# Patient Record
Sex: Male | Born: 1965 | Race: Black or African American | Hispanic: No | Smoking: Current every day smoker
Health system: Southern US, Community
[De-identification: ages and names within clinical notes are randomized; demographics above are authoritative.]

## PROBLEM LIST (undated history)

## (undated) ENCOUNTER — Ambulatory Visit (HOSPITAL_COMMUNITY): Admission: EM | Payer: Commercial Managed Care - HMO | Source: Home / Self Care

## (undated) DIAGNOSIS — I739 Peripheral vascular disease, unspecified: Secondary | ICD-10-CM

## (undated) DIAGNOSIS — I10 Essential (primary) hypertension: Secondary | ICD-10-CM

---

## 1997-08-14 ENCOUNTER — Emergency Department (HOSPITAL_COMMUNITY): Admission: EM | Admit: 1997-08-14 | Discharge: 1997-08-14 | Payer: Self-pay | Admitting: Emergency Medicine

## 1998-03-08 ENCOUNTER — Emergency Department (HOSPITAL_COMMUNITY): Admission: EM | Admit: 1998-03-08 | Discharge: 1998-03-08 | Payer: Self-pay | Admitting: Emergency Medicine

## 1999-03-29 ENCOUNTER — Emergency Department (HOSPITAL_COMMUNITY): Admission: EM | Admit: 1999-03-29 | Discharge: 1999-03-29 | Payer: Self-pay | Admitting: Emergency Medicine

## 2001-04-25 ENCOUNTER — Emergency Department (HOSPITAL_COMMUNITY): Admission: EM | Admit: 2001-04-25 | Discharge: 2001-04-25 | Payer: Self-pay | Admitting: Emergency Medicine

## 2001-11-28 ENCOUNTER — Emergency Department (HOSPITAL_COMMUNITY): Admission: EM | Admit: 2001-11-28 | Discharge: 2001-11-28 | Payer: Self-pay | Admitting: Emergency Medicine

## 2006-06-22 ENCOUNTER — Emergency Department (HOSPITAL_COMMUNITY): Admission: EM | Admit: 2006-06-22 | Discharge: 2006-06-22 | Payer: Self-pay | Admitting: Emergency Medicine

## 2006-08-21 ENCOUNTER — Emergency Department (HOSPITAL_COMMUNITY): Admission: EM | Admit: 2006-08-21 | Discharge: 2006-08-21 | Payer: Self-pay | Admitting: Emergency Medicine

## 2007-01-06 ENCOUNTER — Emergency Department (HOSPITAL_COMMUNITY): Admission: EM | Admit: 2007-01-06 | Discharge: 2007-01-06 | Payer: Self-pay | Admitting: Emergency Medicine

## 2007-03-04 ENCOUNTER — Emergency Department (HOSPITAL_COMMUNITY): Admission: EM | Admit: 2007-03-04 | Discharge: 2007-03-04 | Payer: Self-pay | Admitting: Emergency Medicine

## 2008-04-03 ENCOUNTER — Emergency Department (HOSPITAL_COMMUNITY): Admission: EM | Admit: 2008-04-03 | Discharge: 2008-04-03 | Payer: Self-pay | Admitting: Family Medicine

## 2008-06-12 ENCOUNTER — Observation Stay (HOSPITAL_COMMUNITY): Admission: EM | Admit: 2008-06-12 | Discharge: 2008-06-13 | Payer: Self-pay | Admitting: Internal Medicine

## 2009-01-03 ENCOUNTER — Emergency Department (HOSPITAL_COMMUNITY): Admission: EM | Admit: 2009-01-03 | Discharge: 2009-01-03 | Payer: Self-pay | Admitting: Emergency Medicine

## 2009-11-09 ENCOUNTER — Inpatient Hospital Stay (HOSPITAL_COMMUNITY): Admission: EM | Admit: 2009-11-09 | Discharge: 2009-11-11 | Payer: Self-pay | Admitting: Emergency Medicine

## 2010-03-24 ENCOUNTER — Emergency Department (HOSPITAL_COMMUNITY)
Admission: EM | Admit: 2010-03-24 | Discharge: 2010-03-24 | Payer: Self-pay | Source: Home / Self Care | Admitting: Emergency Medicine

## 2010-05-16 LAB — BASIC METABOLIC PANEL
Calcium: 8.9 mg/dL (ref 8.4–10.5)
Chloride: 105 mEq/L (ref 96–112)
Creatinine, Ser: 0.99 mg/dL (ref 0.4–1.5)
Creatinine, Ser: 1.29 mg/dL (ref 0.4–1.5)
GFR calc Af Amer: >60 mL/min (ref >60)
GFR calc Af Amer: >60 mL/min (ref >60)
Glucose, Bld: 350 mg/dL — ABNORMAL HIGH (ref 70–99)
Sodium: 135 mEq/L (ref 135–145)

## 2010-05-16 LAB — GLUCOSE, CAPILLARY
Glucose-Capillary: 101 mg/dL — ABNORMAL HIGH (ref 70–99)
Glucose-Capillary: 133 mg/dL — ABNORMAL HIGH (ref 70–99)
Glucose-Capillary: 137 mg/dL — ABNORMAL HIGH (ref 70–99)
Glucose-Capillary: 161 mg/dL — ABNORMAL HIGH (ref 70–99)
Glucose-Capillary: 163 mg/dL — ABNORMAL HIGH (ref 70–99)
Glucose-Capillary: 171 mg/dL — ABNORMAL HIGH (ref 70–99)
Glucose-Capillary: 181 mg/dL — ABNORMAL HIGH (ref 70–99)
Glucose-Capillary: 184 mg/dL — ABNORMAL HIGH (ref 70–99)
Glucose-Capillary: 198 mg/dL — ABNORMAL HIGH (ref 70–99)
Glucose-Capillary: 198 mg/dL — ABNORMAL HIGH (ref 70–99)
Glucose-Capillary: 212 mg/dL — ABNORMAL HIGH (ref 70–99)
Glucose-Capillary: 220 mg/dL — ABNORMAL HIGH (ref 70–99)
Glucose-Capillary: 229 mg/dL — ABNORMAL HIGH (ref 70–99)
Glucose-Capillary: 284 mg/dL — ABNORMAL HIGH (ref 70–99)
Glucose-Capillary: 299 mg/dL — ABNORMAL HIGH (ref 70–99)
Glucose-Capillary: 345 mg/dL — ABNORMAL HIGH (ref 70–99)
Glucose-Capillary: 354 mg/dL — ABNORMAL HIGH (ref 70–99)
Glucose-Capillary: 357 mg/dL — ABNORMAL HIGH (ref 70–99)
Glucose-Capillary: 375 mg/dL — ABNORMAL HIGH (ref 70–99)

## 2010-05-16 LAB — CBC
HCT: 41.4 % (ref 39.0–52.0)
HCT: 43.7 % (ref 39.0–52.0)
Hemoglobin: 14.1 g/dL (ref 13.0–17.0)
Hemoglobin: 15 g/dL (ref 13.0–17.0)
Hemoglobin: 15.1 g/dL (ref 13.0–17.0)
MCH: 28.1 pg (ref 26.0–34.0)
MCH: 28.4 pg (ref 26.0–34.0)
MCHC: 34 g/dL (ref 30.0–36.0)
MCHC: 34.3 g/dL (ref 30.0–36.0)
MCHC: 34.5 g/dL (ref 30.0–36.0)
MCV: 82.8 fL (ref 78.0–100.0)
Platelets: 185 10*3/uL (ref 150–400)
Platelets: 208 10*3/uL (ref 150–400)
RBC: 4.8 MIL/uL (ref 4.22–5.81)
RBC: 5.29 MIL/uL (ref 4.22–5.81)
RBC: 5.39 MIL/uL (ref 4.22–5.81)
RDW: 14.6 % (ref 11.5–15.5)
WBC: 12.1 10*3/uL — ABNORMAL HIGH (ref 4.0–10.5)
WBC: 9.5 10*3/uL (ref 4.0–10.5)

## 2010-05-16 LAB — COMPREHENSIVE METABOLIC PANEL
ALT: 23 U/L (ref 0–53)
ALT: 32 U/L (ref 0–53)
Alkaline Phosphatase: 45 U/L (ref 39–117)
BUN: 34 mg/dL — ABNORMAL HIGH (ref 6–23)
CO2: 25 mEq/L (ref 19–32)
CO2: 28 mEq/L (ref 19–32)
Calcium: 8.9 mg/dL (ref 8.4–10.5)
GFR calc Af Amer: >60 mL/min (ref >60)
GFR calc non Af Amer: 38 mL/min — ABNORMAL LOW (ref >60)
GFR calc non Af Amer: >60 mL/min (ref >60)
Glucose, Bld: 128 mg/dL — ABNORMAL HIGH (ref 70–99)
Potassium: 4 mEq/L (ref 3.5–5.1)
Sodium: 139 mEq/L (ref 135–145)

## 2010-05-16 LAB — DIFFERENTIAL
Basophils Relative: 0 % (ref 0–1)
Eosinophils Absolute: 0 10*3/uL (ref 0.0–0.7)
Neutro Abs: 6.7 10*3/uL (ref 1.7–7.7)

## 2010-05-16 LAB — MAGNESIUM: Magnesium: 2.1 mg/dL (ref 1.5–2.5)

## 2010-05-16 LAB — LIPID PANEL
HDL: 56 mg/dL (ref >39)
Triglycerides: 186 mg/dL — ABNORMAL HIGH (ref <150)
VLDL: 37 mg/dL (ref 0–40)

## 2010-05-16 LAB — PROTIME-INR
INR: 1.06 (ref 0.00–1.49)
Prothrombin Time: 14 seconds (ref 11.6–15.2)

## 2010-05-16 LAB — CARDIAC PANEL(CRET KIN+CKTOT+MB+TROPI)
CK, MB: 1.7 ng/mL (ref 0.3–4.0)
Total CK: 296 U/L — ABNORMAL HIGH (ref 7–232)
Troponin I: 0.03 ng/mL (ref 0.00–0.06)

## 2010-05-16 LAB — RAPID URINE DRUG SCREEN, HOSP PERFORMED: Barbiturates: NOT DETECTED

## 2010-05-16 LAB — APTT: aPTT: 25 seconds (ref 24–37)

## 2010-05-16 LAB — HEMOGLOBIN A1C: Hgb A1c MFr Bld: 11.6 % — ABNORMAL HIGH (ref <5.7)

## 2010-06-12 LAB — URINE MICROSCOPIC-ADD ON

## 2010-06-12 LAB — GLUCOSE, CAPILLARY
Glucose-Capillary: 224 mg/dL — ABNORMAL HIGH (ref 70–99)
Glucose-Capillary: 361 mg/dL — ABNORMAL HIGH (ref 70–99)
Glucose-Capillary: 372 mg/dL — ABNORMAL HIGH (ref 70–99)
Glucose-Capillary: 381 mg/dL — ABNORMAL HIGH (ref 70–99)
Glucose-Capillary: 423 mg/dL — ABNORMAL HIGH (ref 70–99)
Glucose-Capillary: 80 mg/dL (ref 70–99)

## 2010-06-12 LAB — DIFFERENTIAL
Lymphocytes Relative: 32 % (ref 12–46)
Lymphs Abs: 1.8 10*3/uL (ref 0.7–4.0)
Neutro Abs: 3.4 10*3/uL (ref 1.7–7.7)
Neutrophils Relative %: 59 % (ref 43–77)

## 2010-06-12 LAB — BASIC METABOLIC PANEL
BUN: 16 mg/dL (ref 6–23)
Calcium: 9.7 mg/dL (ref 8.4–10.5)
Creatinine, Ser: 1.15 mg/dL (ref 0.4–1.5)
GFR calc non Af Amer: >60 mL/min (ref >60)
Potassium: 3.9 mEq/L (ref 3.5–5.1)

## 2010-06-12 LAB — CBC
HCT: 46.7 % (ref 39.0–52.0)
Platelets: 199 10*3/uL (ref 150–400)
WBC: 5.8 10*3/uL (ref 4.0–10.5)

## 2010-06-12 LAB — POCT I-STAT, CHEM 8
Calcium, Ion: 1.18 mmol/L (ref 1.12–1.32)
Chloride: 100 mEq/L (ref 96–112)
HCT: 50 % (ref 39.0–52.0)
Potassium: 4.2 mEq/L (ref 3.5–5.1)

## 2010-06-12 LAB — URINALYSIS, ROUTINE W REFLEX MICROSCOPIC
Glucose, UA: >1000 mg/dL — AB
Hgb urine dipstick: NEGATIVE
Specific Gravity, Urine: 1.038 — ABNORMAL HIGH (ref 1.005–1.030)
Urobilinogen, UA: 0.2 mg/dL (ref 0.0–1.0)

## 2010-06-18 LAB — POCT RAPID STREP A (OFFICE): Streptococcus, Group A Screen (Direct): NEGATIVE

## 2010-12-10 LAB — I-STAT 8, (EC8 V) (CONVERTED LAB)
BUN: 14
Bicarbonate: 27.4 — ABNORMAL HIGH
Chloride: 107
Glucose, Bld: 103 — ABNORMAL HIGH
Hemoglobin: 17
pCO2, Ven: 50.6 — ABNORMAL HIGH

## 2011-11-09 ENCOUNTER — Encounter (HOSPITAL_COMMUNITY): Payer: Self-pay

## 2011-11-09 ENCOUNTER — Emergency Department (HOSPITAL_COMMUNITY)
Admission: EM | Admit: 2011-11-09 | Discharge: 2011-11-09 | Disposition: A | Discharge status: Home / Self Care | Payer: BC Managed Care – PPO | Attending: Emergency Medicine | Admitting: Emergency Medicine

## 2011-11-09 DIAGNOSIS — I10 Essential (primary) hypertension: Secondary | ICD-10-CM | POA: Insufficient documentation

## 2011-11-09 DIAGNOSIS — E119 Type 2 diabetes mellitus without complications: Secondary | ICD-10-CM | POA: Insufficient documentation

## 2011-11-09 DIAGNOSIS — K047 Periapical abscess without sinus: Secondary | ICD-10-CM | POA: Insufficient documentation

## 2011-11-09 DIAGNOSIS — F17200 Nicotine dependence, unspecified, uncomplicated: Secondary | ICD-10-CM | POA: Insufficient documentation

## 2011-11-09 HISTORY — DX: Essential (primary) hypertension: I10

## 2011-11-09 MED ORDER — OXYCODONE-ACETAMINOPHEN 5-325 MG PO TABS: 2.0000 | ORAL_TABLET | ORAL | Status: AC | PRN

## 2011-11-09 MED ORDER — HYDROMORPHONE HCL PF 1 MG/ML IJ SOLN: 1.0000 mg | Freq: Once | INTRAMUSCULAR | Status: DC

## 2011-11-09 MED ORDER — OXYCODONE-ACETAMINOPHEN 5-325 MG PO TABS
2.0000 | ORAL_TABLET | Freq: Once | ORAL | Status: AC
  Administered 2011-11-09: 2 via ORAL
  Filled 2011-11-09: qty 2

## 2011-11-09 MED ORDER — OXYCODONE-ACETAMINOPHEN 5-325 MG PO TABS: 2.0000 | ORAL_TABLET | Freq: Once | ORAL | Status: DC

## 2011-11-09 NOTE — ED Notes (Signed)
Pt reports dental pain to upper left mouth.  Pain for 1 week- "busted open 5 days ago only bloody drained out" denies N/V/D and fever

## 2011-11-09 NOTE — ED Provider Notes (Signed)
History     CSN: [id number]  Arrival date & time 11/09/11  1553   First MD Initiated Contact with Patient 11/09/11 1831      Chief Complaint  Patient presents with  . Dental Pain  . Facial Swelling    x 6 days    (Consider location/radiation/quality/duration/timing/severity/associated sxs/prior treatment) HPI Comments: Jose Morrow 46 y.o. male   The chief complaint is: Patient presents with:   Dental Pain   Facial Swelling - x 6 days   The patient has medical history significant for:   Past Medical History:   Diabetes mellitus                                            Hypertension                                                Patient presents with dental pain present for 1 week. Patient states he saw his primary care who prescribed clindamycin but that the antibiotics do not seem to be working. Patient states that 5 days open the area leaked bloody discharge. He states he assumed he was not supposed to see the dentist until he finished the antibiotics. Denies fever or chills. Denies dysphagia or tongue swelling. Denies NVD or abdominal pain.     The history is provided by the patient.    Past Medical History  Diagnosis Date  . Diabetes mellitus   . Hypertension     History reviewed. No pertinent past surgical history.  No family history on file.  History  Substance Use Topics  . Smoking status: Current Everyday Smoker  . Smokeless tobacco: Not on file  . Alcohol Use: Yes      Review of Systems  Constitutional: Negative for fever and chills.  HENT: Positive for dental problem. Negative for trouble swallowing.   Gastrointestinal: Negative for nausea, vomiting, abdominal pain and diarrhea.  All other systems reviewed and are negative.    Allergies  Penicillins  Home Medications   Current Outpatient Rx  Name Route Sig Dispense Refill  . CLINDAMYCIN HCL 300 MG PO CAPS Oral Take 300 mg by mouth 4 (four) times daily.    . IBUPROFEN 200 MG PO TABS  Oral Take 200-600 mg by mouth every 6 (six) hours as needed. For pain      BP 165/94  Pulse 77  Temp 98.2 F (36.8 C) (Oral)  Resp 18  Wt 212 lb (96.163 kg)  SpO2 100%  Physical Exam  Nursing note and vitals reviewed. Constitutional: He appears well-developed and well-nourished.  HENT:  Head: Normocephalic and atraumatic.  Mouth/Throat: Oropharynx is clear and moist. No oropharyngeal exudate.       Dentition in poor repair two teeth on the left side with caries and and broken off. There is a large area of swelling, tenderness, and pain, on the left portion of the upper palate consistent with abscess.   Eyes: Conjunctivae and EOM are normal. Pupils are equal, round, and reactive to light.  Neck: Normal range of motion. Neck supple.  Cardiovascular: Normal rate, regular rhythm and normal heart sounds.   Pulmonary/Chest: Effort normal and breath sounds normal.  Abdominal: Soft. Bowel sounds are normal. There is no tenderness.  Skin: Skin is warm and dry.    ED Course  Procedures (including critical care time)  Labs Reviewed - No data to display No results found.   1. Dental abscess       MDM  Patient presented with a dental abscess of the upper palate. Area sterilized, lidocaine applied, and 5cc's of pus drained per recommendation of attending physician. Patient discharged on pain medication and instructed to continue the clindamycin written by his PCP. Patient referred to dentist on call, with instruction to make appointment tomorrow. No red flags for Ludwig's angina. Return precautions given verbally and in discharge summary.        Pixie Casino, PA-C 11/09/11 2127

## 2011-11-09 NOTE — ED Provider Notes (Signed)
Medical screening examination/treatment/procedure(s) were performed by non-physician practitioner and as supervising physician I was immediately available for consultation/collaboration.   Glynn Octave, MD 11/09/11 [phone]

## 2012-01-22 ENCOUNTER — Other Ambulatory Visit: Payer: Self-pay | Admitting: Family

## 2012-01-22 ENCOUNTER — Ambulatory Visit
Admission: RE | Admit: 2012-01-22 | Discharge: 2012-01-22 | Disposition: A | Discharge status: Home / Self Care | Payer: Self-pay | Source: Ambulatory Visit | Attending: Internal Medicine | Admitting: Internal Medicine

## 2012-01-22 DIAGNOSIS — R05 Cough: Secondary | ICD-10-CM

## 2012-03-10 ENCOUNTER — Encounter (HOSPITAL_COMMUNITY): Payer: Self-pay | Admitting: Emergency Medicine

## 2012-03-10 ENCOUNTER — Emergency Department (HOSPITAL_COMMUNITY)
Admission: EM | Admit: 2012-03-10 | Discharge: 2012-03-10 | Disposition: A | Discharge status: Home / Self Care | Payer: BC Managed Care – PPO | Attending: Emergency Medicine | Admitting: Emergency Medicine

## 2012-03-10 DIAGNOSIS — E119 Type 2 diabetes mellitus without complications: Secondary | ICD-10-CM | POA: Insufficient documentation

## 2012-03-10 DIAGNOSIS — R6883 Chills (without fever): Secondary | ICD-10-CM | POA: Insufficient documentation

## 2012-03-10 DIAGNOSIS — I10 Essential (primary) hypertension: Secondary | ICD-10-CM | POA: Insufficient documentation

## 2012-03-10 DIAGNOSIS — F17200 Nicotine dependence, unspecified, uncomplicated: Secondary | ICD-10-CM | POA: Insufficient documentation

## 2012-03-10 DIAGNOSIS — K047 Periapical abscess without sinus: Secondary | ICD-10-CM | POA: Insufficient documentation

## 2012-03-10 MED ORDER — CEFTRIAXONE SODIUM 1 G IJ SOLR
1.0000 g | Freq: Once | INTRAMUSCULAR | Status: AC
  Administered 2012-03-10: 1 g via INTRAMUSCULAR
  Filled 2012-03-10: qty 10

## 2012-03-10 MED ORDER — CLINDAMYCIN HCL 300 MG PO CAPS: 300.0000 mg | ORAL_CAPSULE | Freq: Four times a day (QID) | ORAL | Status: DC

## 2012-03-10 MED ORDER — BUPIVACAINE-EPINEPHRINE PF 0.5-1:200000 % IJ SOLN
1.8000 mL | Freq: Once | INTRAMUSCULAR | Status: AC
  Administered 2012-03-10: 9 mg
  Filled 2012-03-10: qty 1.8

## 2012-03-10 MED ORDER — OXYCODONE-ACETAMINOPHEN 5-325 MG PO TABS
2.0000 | ORAL_TABLET | Freq: Once | ORAL | Status: AC
  Administered 2012-03-10: 2 via ORAL
  Filled 2012-03-10: qty 2

## 2012-03-10 NOTE — ED Provider Notes (Signed)
History     CSN: [id number]  Arrival date & time 03/10/12  0215   First MD Initiated Contact with Patient 03/10/12 [phone]      Chief Complaint  Patient presents with  . Dental Pain   HPI  History provided by the patient. Patient is a 47 year old male with history of hypertension and diabetes who presents with complaints of dental pain and abscess. Patient reports having increasing swelling and pain to his left upper mouth for the past week. Patient has had similar issues off and on for quite some time. He reports seeing a dentist not too long ago who did perform x-rays and provided prescription for amoxicillin. Patient states after taking amoxicillin for 7 days swelling went down and symptoms improved but they have returned again. Patient states his dentist would not do further treatments because they do not accept his [employer] but he was told he require surgery to remove his teeth. Symptoms today have been associated with some chills but no fevers. No difficulty swallowing or breathing. No difficulty opening or closing her mouth. Pain has been improved with ibuprofen slightly. Denies any other aggravating or alleviating factors.    Past Medical History  Diagnosis Date  . Diabetes mellitus   . Hypertension     History reviewed. No pertinent past surgical history.  No family history on file.  History  Substance Use Topics  . Smoking status: Current Every Day Smoker  . Smokeless tobacco: Not on file  . Alcohol Use: Yes      Review of Systems  Constitutional: Positive for chills. Negative for fever.  HENT: Positive for dental problem.   Gastrointestinal: Negative for nausea, vomiting and diarrhea.  All other systems reviewed and are negative.    Allergies  Penicillins and Ibuprofen  Home Medications   Current Outpatient Rx  Name  Route  Sig  Dispense  Refill  . IBUPROFEN 800 MG PO TABS   Oral   Take 800 mg by mouth every 8 (eight) hours as  needed. For pain           BP 196/109  Pulse 75  Temp 98 F (36.7 C) (Oral)  Resp 16  SpO2 99%  Physical Exam  Nursing note and vitals reviewed. Constitutional: He appears well-developed and well-nourished. No distress.  HENT:  Head: Normocephalic.  Mouth/Throat:         Poor dentition throughout. Several teeth in the left route mouth with significant decay to the gumline. There is significant swelling adjacent to the first premolar tooth with fluctuance nodule extending to the roof of the mouth  Cardiovascular: Normal rate and regular rhythm.   Pulmonary/Chest: Effort normal and breath sounds normal.  Neurological: He is alert.  Skin: Skin is warm.  Psychiatric: He has a normal mood and affect. His behavior is normal.    ED Course  Procedures   INCISION AND DRAINAGE Performed by: Angus Seller Consent: Verbal consent obtained. Risks and benefits: risks, benefits and alternatives were discussed Type: Dental abscess  Location: Medial left upper first premolar  Anesthesia: local infiltration  Incision was made with a scalpel.  Local anesthetic: Bupivacaine 0.5% with epinephrine  Anesthetic total: 1.8 ml  Complexity: complex  Drainage: purulent  Drainage amount: Large   Packing material: None   Patient tolerance: Patient tolerated the procedure well with no immediate complications.     1. Dental abscess       MDM  3:20AM patient seen and evaluated. Findings consistent  with dental abscess with fluctuance.        Angus Seller, [place] 03/10/12 [phone]

## 2012-03-10 NOTE — ED Notes (Signed)
Pt alert, arrives from home, c/o dental pain, onset several weeks ago, resp even unlabored, skin pwd, multiple dental caries noted

## 2012-03-10 NOTE — ED Provider Notes (Signed)
Medical screening examination/treatment/procedure(s) were performed by non-physician practitioner and as supervising physician I was immediately available for consultation/collaboration.  Timara Loma, MD 03/10/12 0728 

## 2012-09-20 ENCOUNTER — Emergency Department (HOSPITAL_COMMUNITY): Payer: BC Managed Care – PPO

## 2012-09-20 ENCOUNTER — Emergency Department (HOSPITAL_COMMUNITY)
Admission: EM | Admit: 2012-09-20 | Discharge: 2012-09-20 | Disposition: A | Discharge status: Home / Self Care | Payer: BC Managed Care – PPO | Attending: Emergency Medicine | Admitting: Emergency Medicine

## 2012-09-20 ENCOUNTER — Encounter (HOSPITAL_COMMUNITY): Payer: Self-pay | Admitting: *Deleted

## 2012-09-20 DIAGNOSIS — I10 Essential (primary) hypertension: Secondary | ICD-10-CM | POA: Insufficient documentation

## 2012-09-20 DIAGNOSIS — Y9389 Activity, other specified: Secondary | ICD-10-CM | POA: Insufficient documentation

## 2012-09-20 DIAGNOSIS — Z880 Allergy status to penicillin: Secondary | ICD-10-CM | POA: Insufficient documentation

## 2012-09-20 DIAGNOSIS — Y929 Unspecified place or not applicable: Secondary | ICD-10-CM | POA: Insufficient documentation

## 2012-09-20 DIAGNOSIS — F17200 Nicotine dependence, unspecified, uncomplicated: Secondary | ICD-10-CM | POA: Insufficient documentation

## 2012-09-20 DIAGNOSIS — E119 Type 2 diabetes mellitus without complications: Secondary | ICD-10-CM | POA: Insufficient documentation

## 2012-09-20 DIAGNOSIS — IMO0002 Reserved for concepts with insufficient information to code with codable children: Secondary | ICD-10-CM | POA: Insufficient documentation

## 2012-09-20 DIAGNOSIS — L03019 Cellulitis of unspecified finger: Secondary | ICD-10-CM | POA: Insufficient documentation

## 2012-09-20 DIAGNOSIS — L03011 Cellulitis of right finger: Secondary | ICD-10-CM

## 2012-09-20 MED ORDER — HYDROCODONE-ACETAMINOPHEN 5-325 MG PO TABS: 1.0000 | ORAL_TABLET | ORAL | Status: DC | PRN

## 2012-09-20 MED ORDER — SULFAMETHOXAZOLE-TRIMETHOPRIM 800-160 MG PO TABS: 1.0000 | ORAL_TABLET | Freq: Two times a day (BID) | ORAL | Status: DC
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
## 2012-09-20 NOTE — ED Provider Notes (Signed)
Medical screening examination/treatment/procedure(s) were performed by non-physician practitioner and as supervising physician I was immediately available for consultation/collaboration.   Ashby Dawes, MD 09/20/12 [phone]

## 2012-09-20 NOTE — Progress Notes (Signed)
Patient reports his pcp is Malachy Chamber NP
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
## 2012-09-20 NOTE — ED Notes (Signed)
Pt states hasn't taken BP medication x 3 days, states he has to go pick it up today when leaving here.

## 2012-10-27 ENCOUNTER — Emergency Department (HOSPITAL_COMMUNITY)
Admission: EM | Admit: 2012-10-27 | Discharge: 2012-10-27 | Disposition: A | Discharge status: Home / Self Care | Payer: BC Managed Care – PPO | Attending: Emergency Medicine | Admitting: Emergency Medicine

## 2012-10-27 DIAGNOSIS — R0602 Shortness of breath: Secondary | ICD-10-CM | POA: Insufficient documentation

## 2012-10-27 DIAGNOSIS — E119 Type 2 diabetes mellitus without complications: Secondary | ICD-10-CM | POA: Insufficient documentation

## 2012-10-27 DIAGNOSIS — R42 Dizziness and giddiness: Secondary | ICD-10-CM | POA: Insufficient documentation

## 2012-10-27 DIAGNOSIS — R5381 Other malaise: Secondary | ICD-10-CM | POA: Insufficient documentation

## 2012-10-27 DIAGNOSIS — Z880 Allergy status to penicillin: Secondary | ICD-10-CM | POA: Insufficient documentation

## 2012-10-27 DIAGNOSIS — F17200 Nicotine dependence, unspecified, uncomplicated: Secondary | ICD-10-CM | POA: Insufficient documentation

## 2012-10-27 DIAGNOSIS — M542 Cervicalgia: Secondary | ICD-10-CM | POA: Insufficient documentation

## 2012-10-27 DIAGNOSIS — Z79899 Other long term (current) drug therapy: Secondary | ICD-10-CM | POA: Insufficient documentation

## 2012-10-27 DIAGNOSIS — Z794 Long term (current) use of insulin: Secondary | ICD-10-CM | POA: Insufficient documentation

## 2012-10-27 DIAGNOSIS — I10 Essential (primary) hypertension: Secondary | ICD-10-CM | POA: Insufficient documentation

## 2012-10-27 LAB — COMPREHENSIVE METABOLIC PANEL
ALT: 29 U/L (ref 0–53)
AST: 30 U/L (ref 0–37)
Albumin: 4 g/dL (ref 3.5–5.2)
Alkaline Phosphatase: 58 U/L (ref 39–117)
BUN: 15 mg/dL (ref 6–23)
CO2: 23 mEq/L (ref 19–32)
Calcium: 9.3 mg/dL (ref 8.4–10.5)
Chloride: 102 mEq/L (ref 96–112)
Creatinine, Ser: 0.9 mg/dL (ref 0.50–1.35)
GFR calc Af Amer: >90 mL/min (ref >90)
GFR calc non Af Amer: >90 mL/min (ref >90)
Glucose, Bld: 264 mg/dL — ABNORMAL HIGH (ref 70–99)
Potassium: 4 mEq/L (ref 3.5–5.1)
Sodium: 135 mEq/L (ref 135–145)
Total Bilirubin: 0.3 mg/dL (ref 0.3–1.2)
Total Protein: 7.3 g/dL (ref 6.0–8.3)

## 2012-10-27 LAB — CBC
HCT: 46.6 % (ref 39.0–52.0)
Hemoglobin: 16.5 g/dL (ref 13.0–17.0)
MCH: 28 pg (ref 26.0–34.0)
MCHC: 35.4 g/dL (ref 30.0–36.0)
MCV: 79.1 fL (ref 78.0–100.0)
Platelets: 193 10*3/uL (ref 150–400)
RBC: 5.89 MIL/uL — ABNORMAL HIGH (ref 4.22–5.81)
RDW: 13.5 % (ref 11.5–15.5)
WBC: 4.8 10*3/uL (ref 4.0–10.5)

## 2012-10-27 LAB — POCT I-STAT TROPONIN I: Troponin i, poc: 0.02 ng/mL (ref 0.00–0.08)

## 2012-10-27 MED ORDER — SODIUM CHLORIDE 0.9 % IV BOLUS (SEPSIS)
1000.0000 mL | Freq: Once | INTRAVENOUS | Status: AC
  Administered 2012-10-27: 1000 mL via INTRAVENOUS

## 2012-10-27 NOTE — ED Provider Notes (Signed)
CSN: [id number]     Arrival date & time 10/27/12  0807 History   First MD Initiated Contact with Patient 10/27/12 [phone]     Chief Complaint  Patient presents with  . Dizziness   (Consider location/radiation/quality/duration/timing/severity/associated sxs/prior Treatment) HPI 47 year old male with HTN and DM presents to the ED with complaints of dizziness.  He reports that last night he had sudden onset lightheadedness. No exacerbating factors.  Relieved by rest.  He reports that he felt as if he was going to pass out.  No reported vertigo. No associated headache, nausea, vomiting, chest pain.  He does report generalized weakness. No recent illness.    Of note, he reports that he misses several doses of his antihypertensives weekly.  Past Medical History  Diagnosis Date  . Diabetes mellitus   . Hypertension    No past surgical history on file. No family history on file. History  Substance Use Topics  . Smoking status: Current Every Day Smoker  . Smokeless tobacco: Never Used  . Alcohol Use: Yes    Review of Systems  Constitutional: Negative for fever and chills.  HENT: Positive for neck pain.   Respiratory: Positive for shortness of breath. Negative for chest tightness.   Cardiovascular: Negative for chest pain.  Gastrointestinal: Negative for nausea and vomiting.  Genitourinary: Negative.   Skin: Negative.   Neurological: Positive for dizziness, weakness and light-headedness. Negative for headaches.    Allergies  Penicillins  Home Medications   Current Outpatient Rx  Name  Route  Sig  Dispense  Refill  . amLODipine (NORVASC) 10 MG tablet   Oral   Take 10 mg by mouth daily.         . hydrochlorothiazide (HYDRODIURIL) 25 MG tablet   Oral   Take 25 mg by mouth daily.         . insulin glargine (LANTUS) 100 UNIT/ML injection   Subcutaneous   Inject 80 Units into the skin every morning.          . metFORMIN (GLUCOPHAGE) 500 MG tablet   Oral   Take 500 mg  by mouth at bedtime.           BP 167/112  Pulse 68  Temp(Src) 97.9 F (36.6 C) (Oral)  Resp 20  SpO2 94% Physical Exam  Constitutional: He is oriented to person, place, and time. He appears well-developed and well-nourished. No distress.  HENT:  Head: Normocephalic and atraumatic.  Mouth/Throat: Oropharynx is clear and moist. No oropharyngeal exudate.  Eyes: Conjunctivae and EOM are normal. Pupils are equal, round, and reactive to light. No scleral icterus.  Neck: Neck supple.  Cardiovascular: Normal rate and regular rhythm.  Exam reveals no gallop and no friction rub.   No murmur heard. Pulmonary/Chest: Effort normal and breath sounds normal. He has no wheezes. He has no rales.  Abdominal: Soft. He exhibits no distension. There is no tenderness.  Musculoskeletal: He exhibits no edema.  Neurological: He is alert and oriented to person, place, and time. No cranial nerve deficit.  Muscle strength 5/5 in all extremities.  No pronator drift. Negative Romberg.   Skin: Skin is warm and dry.   ED Course  Procedures (including critical care time) EKG:    Date: 10/27/2012  Rate: 65  Rhythm: normal sinus rhythm  QRS Axis: normal  Intervals: normal  ST/T Wave abnormalities: ST elevations anteriorly; No change from prior.   Conduction Disutrbances:none  Old EKG Reviewed: unchanged  Labs Review Labs Reviewed  CBC - Abnormal; Notable for the following:    RBC 5.89 (*)    All other components within normal limits  COMPREHENSIVE METABOLIC PANEL - Abnormal; Notable for the following:    Glucose, Bld 264 (*)    All other components within normal limits  POCT I-STAT TROPONIN I   Imaging Review No results found.  MDM   1. Lightheadedness    47 year old male with HTN and DM-2 (poorly controlled) presents with recent lightheadedness. - Appears pre-syncopal in nature.  Will obtain EKG, Troponin, CBC, and CMP. - Will give IV fluids and await studies.    1610 - EKG unchanged from  prior.  CBC unremarkable and Troponin Negative.   1025 - Labs unremarkable.  Patient feeling well with no dizziness/lightheadedness.  Able to ambulate well.  Patient stable for discharge home with close PCP follow up.   Tommie Sams, DO 10/27/12 1029

## 2012-11-04 NOTE — ED Provider Notes (Signed)
I saw and evaluated the patient, reviewed the resident's note and I agree with the findings and plan.  46yM with pre-syncope. Nonfocal exam. Reassuring w/u. Outpt FU.   Raeford Razor, MD 11/04/12 0010

## 2013-06-29 ENCOUNTER — Encounter (HOSPITAL_COMMUNITY): Payer: Self-pay | Admitting: Emergency Medicine

## 2013-06-29 ENCOUNTER — Emergency Department (HOSPITAL_COMMUNITY)
Admission: EM | Admit: 2013-06-29 | Discharge: 2013-06-29 | Disposition: A | Discharge status: Home / Self Care | Payer: No Typology Code available for payment source | Attending: Emergency Medicine | Admitting: Emergency Medicine

## 2013-06-29 DIAGNOSIS — IMO0002 Reserved for concepts with insufficient information to code with codable children: Secondary | ICD-10-CM | POA: Insufficient documentation

## 2013-06-29 DIAGNOSIS — E119 Type 2 diabetes mellitus without complications: Secondary | ICD-10-CM | POA: Insufficient documentation

## 2013-06-29 DIAGNOSIS — Z79899 Other long term (current) drug therapy: Secondary | ICD-10-CM | POA: Insufficient documentation

## 2013-06-29 DIAGNOSIS — Z880 Allergy status to penicillin: Secondary | ICD-10-CM | POA: Diagnosis not present

## 2013-06-29 DIAGNOSIS — M62830 Muscle spasm of back: Secondary | ICD-10-CM

## 2013-06-29 DIAGNOSIS — I10 Essential (primary) hypertension: Secondary | ICD-10-CM | POA: Diagnosis not present

## 2013-06-29 DIAGNOSIS — K006 Disturbances in tooth eruption: Secondary | ICD-10-CM | POA: Insufficient documentation

## 2013-06-29 DIAGNOSIS — Y92410 Unspecified street and highway as the place of occurrence of the external cause: Secondary | ICD-10-CM | POA: Insufficient documentation

## 2013-06-29 DIAGNOSIS — Z794 Long term (current) use of insulin: Secondary | ICD-10-CM | POA: Insufficient documentation

## 2013-06-29 DIAGNOSIS — Y9389 Activity, other specified: Secondary | ICD-10-CM | POA: Diagnosis not present

## 2013-06-29 DIAGNOSIS — F17200 Nicotine dependence, unspecified, uncomplicated: Secondary | ICD-10-CM | POA: Insufficient documentation
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
## 2014-07-24 ENCOUNTER — Emergency Department (HOSPITAL_COMMUNITY): Payer: BLUE CROSS/BLUE SHIELD

## 2014-07-24 ENCOUNTER — Encounter (HOSPITAL_COMMUNITY): Payer: Self-pay | Admitting: Emergency Medicine

## 2014-07-24 ENCOUNTER — Emergency Department (HOSPITAL_COMMUNITY)
Admission: EM | Admit: 2014-07-24 | Discharge: 2014-07-24 | Disposition: A | Discharge status: Home / Self Care | Payer: BLUE CROSS/BLUE SHIELD | Attending: Emergency Medicine | Admitting: Emergency Medicine

## 2014-07-24 DIAGNOSIS — S61215A Laceration without foreign body of left ring finger without damage to nail, initial encounter: Secondary | ICD-10-CM | POA: Insufficient documentation

## 2014-07-24 DIAGNOSIS — Y998 Other external cause status: Secondary | ICD-10-CM | POA: Diagnosis not present

## 2014-07-24 DIAGNOSIS — Z880 Allergy status to penicillin: Secondary | ICD-10-CM | POA: Diagnosis not present

## 2014-07-24 DIAGNOSIS — Z794 Long term (current) use of insulin: Secondary | ICD-10-CM | POA: Insufficient documentation

## 2014-07-24 DIAGNOSIS — Z79899 Other long term (current) drug therapy: Secondary | ICD-10-CM | POA: Insufficient documentation

## 2014-07-24 DIAGNOSIS — Y9389 Activity, other specified: Secondary | ICD-10-CM | POA: Insufficient documentation

## 2014-07-24 DIAGNOSIS — Z720 Tobacco use: Secondary | ICD-10-CM | POA: Insufficient documentation

## 2014-07-24 DIAGNOSIS — S61219A Laceration without foreign body of unspecified finger without damage to nail, initial encounter: Secondary | ICD-10-CM

## 2014-07-24 DIAGNOSIS — I10 Essential (primary) hypertension: Secondary | ICD-10-CM | POA: Insufficient documentation

## 2014-07-24 DIAGNOSIS — W500XXA Accidental hit or strike by another person, initial encounter: Secondary | ICD-10-CM | POA: Diagnosis not present

## 2014-07-24 DIAGNOSIS — E119 Type 2 diabetes mellitus without complications: Secondary | ICD-10-CM | POA: Insufficient documentation

## 2014-07-24 DIAGNOSIS — Y9289 Other specified places as the place of occurrence of the external cause: Secondary | ICD-10-CM | POA: Diagnosis not present

## 2014-07-24 MED ORDER — CLINDAMYCIN HCL 150 MG PO CAPS: 150.0000 mg | ORAL_CAPSULE | Freq: Four times a day (QID) | ORAL | Status: DC

## 2014-07-24 MED ORDER — LIDOCAINE HCL (PF) 1 % IJ SOLN: 5.0000 mL | Freq: Once | INTRAMUSCULAR | Status: DC

## 2014-07-24 MED ORDER — BACITRACIN ZINC 500 UNIT/GM EX OINT
1.0000 "application " | TOPICAL_OINTMENT | Freq: Two times a day (BID) | CUTANEOUS | Status: DC
  Administered 2014-07-24: 1 via TOPICAL
  Filled 2014-07-24: qty 1.8

## 2014-07-24 MED ORDER — CLINDAMYCIN HCL 300 MG PO CAPS
300.0000 mg | ORAL_CAPSULE | Freq: Once | ORAL | Status: AC
  Administered 2014-07-24: 300 mg via ORAL
  Filled 2014-07-24: qty 1

## 2014-07-24 MED ORDER — SULFAMETHOXAZOLE-TRIMETHOPRIM 800-160 MG PO TABS: 1.0000 | ORAL_TABLET | Freq: Once | ORAL | Status: DC

## 2014-07-24 MED ORDER — IBUPROFEN 800 MG PO TABS
800.0000 mg | ORAL_TABLET | Freq: Once | ORAL | Status: AC
  Administered 2014-07-24: 800 mg via ORAL
  Filled 2014-07-24: qty 1

## 2014-07-24 MED ORDER — LIDOCAINE HCL 1 % IJ SOLN
INTRAMUSCULAR | Status: AC
  Administered 2014-07-24: 20 mL
  Filled 2014-07-24: qty 20

## 2014-07-24 NOTE — ED Notes (Signed)
Md ar bedside to suture hand lac. Pt tolerating well.

## 2014-07-24 NOTE — ED Notes (Signed)
Patient transported to X-ray 
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
## 2014-07-24 NOTE — ED Notes (Signed)
Awake. Verbally responsive. A/O x4. Resp even and unlabored. No audible adventitious breath sounds noted. ABC's intact.  

## 2014-07-24 NOTE — Discharge Instructions (Signed)
If you were given medicines take as directed.  If you are on coumadin or contraceptives realize their levels and effectiveness is altered by many different medicines.  If you have any reaction (rash, tongues swelling, other) to the medicines stop taking and see a physician.   Keep wound clean, watch for signs of infection such as pus training, spreading redness, worsening pain and swelling.  If your blood pressure was elevated in the ER make sure you follow up for management with a primary doctor or return for chest pain, shortness of breath or stroke symptoms.  Please follow up as directed and return to the ER or see a physician for new or worsening symptoms.  Thank you. Filed Vitals:   07/24/14 0716  BP: 163/95  Pulse: 80  Resp: 16  SpO2: 100%
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
## 2014-07-24 NOTE — ED Notes (Signed)
Applied bacitracin oint nonadhesive dry dsg to 4th lt finger lac. Pt tolerated well.

## 2015-06-22 ENCOUNTER — Emergency Department (HOSPITAL_COMMUNITY)
Admission: EM | Admit: 2015-06-22 | Discharge: 2015-06-23 | Disposition: A | Discharge status: Home / Self Care | Payer: BLUE CROSS/BLUE SHIELD | Attending: Emergency Medicine | Admitting: Emergency Medicine

## 2015-06-22 ENCOUNTER — Encounter (HOSPITAL_COMMUNITY): Payer: Self-pay | Admitting: *Deleted

## 2015-06-22 DIAGNOSIS — E119 Type 2 diabetes mellitus without complications: Secondary | ICD-10-CM | POA: Insufficient documentation

## 2015-06-22 DIAGNOSIS — I159 Secondary hypertension, unspecified: Secondary | ICD-10-CM

## 2015-06-22 DIAGNOSIS — F17200 Nicotine dependence, unspecified, uncomplicated: Secondary | ICD-10-CM | POA: Insufficient documentation

## 2015-06-22 DIAGNOSIS — R51 Headache: Secondary | ICD-10-CM | POA: Diagnosis present

## 2015-06-22 DIAGNOSIS — Z880 Allergy status to penicillin: Secondary | ICD-10-CM | POA: Insufficient documentation

## 2015-06-22 LAB — URINALYSIS, ROUTINE W REFLEX MICROSCOPIC
Bilirubin Urine: NEGATIVE
Hgb urine dipstick: NEGATIVE
Ketones, ur: NEGATIVE mg/dL
LEUKOCYTES UA: NEGATIVE
NITRITE: NEGATIVE
PROTEIN: NEGATIVE mg/dL
Specific Gravity, Urine: 1.039 — ABNORMAL HIGH (ref 1.005–1.030)
pH: 7 (ref 5.0–8.0)

## 2015-06-22 LAB — CBG MONITORING, ED: GLUCOSE-CAPILLARY: 330 mg/dL — AB (ref 65–99)

## 2015-06-22 LAB — URINE MICROSCOPIC-ADD ON

## 2015-06-22 LAB — COMPREHENSIVE METABOLIC PANEL
ALT: 14 U/L — AB (ref 17–63)
AST: 20 U/L (ref 15–41)
Albumin: 3.8 g/dL (ref 3.5–5.0)
Alkaline Phosphatase: 53 U/L (ref 38–126)
Anion gap: 11 (ref 5–15)
BILIRUBIN TOTAL: 0.7 mg/dL (ref 0.3–1.2)
BUN: 9 mg/dL (ref 6–20)
CHLORIDE: 101 mmol/L (ref 101–111)
CO2: 24 mmol/L (ref 22–32)
CREATININE: 1.04 mg/dL (ref 0.61–1.24)
Calcium: 8.9 mg/dL (ref 8.9–10.3)
Glucose, Bld: 356 mg/dL — ABNORMAL HIGH (ref 65–99)
Potassium: 3.9 mmol/L (ref 3.5–5.1)
Sodium: 136 mmol/L (ref 135–145)
Total Protein: 6.5 g/dL (ref 6.5–8.1)

## 2015-06-22 LAB — CBC
HCT: 43.2 % (ref 39.0–52.0)
Hemoglobin: 15 g/dL (ref 13.0–17.0)
MCH: 27.3 pg (ref 26.0–34.0)
MCHC: 34.7 g/dL (ref 30.0–36.0)
MCV: 78.5 fL (ref 78.0–100.0)
PLATELETS: 186 10*3/uL (ref 150–400)
RBC: 5.5 MIL/uL (ref 4.22–5.81)
RDW: 13.7 % (ref 11.5–15.5)
WBC: 6.3 10*3/uL (ref 4.0–10.5)

## 2015-06-22 LAB — TROPONIN I: Troponin I: <0.03 ng/mL (ref <0.031)

## 2015-06-22 NOTE — ED Notes (Signed)
ekg at the doctors office no labs drawn

## 2015-06-22 NOTE — ED Notes (Signed)
CBG 330. RN notified

## 2015-06-22 NOTE — ED Notes (Signed)
No diabetic medicine either

## 2015-06-22 NOTE — ED Notes (Signed)
The pt went to his doctor for a check-up  And his bp was high he was sent here for treatment.  The pt has not had bp meds for 4 months.  He was given clonidine 0.2 at the office just before he was sent.  The pt was on hctz 25mg   norvasc 10mg  and losartan  50 mg  No pain anywhere

## 2015-06-23 MED ORDER — INSULIN GLARGINE 100 UNIT/ML ~~LOC~~ SOLN
80.0000 [IU] | Freq: Every morning | SUBCUTANEOUS | Status: DC
Start: 2015-06-23 — End: 2020-06-26

## 2015-06-23 MED ORDER — HYDROCHLOROTHIAZIDE 25 MG PO TABS: 25.0000 mg | ORAL_TABLET | Freq: Every day | ORAL | Status: DC

## 2015-06-23 MED ORDER — AMLODIPINE BESYLATE 5 MG PO TABS
10.0000 mg | ORAL_TABLET | Freq: Once | ORAL | Status: AC
  Administered 2015-06-23: 10 mg via ORAL
  Filled 2015-06-23: qty 2

## 2015-06-23 MED ORDER — LOVASTATIN 20 MG PO TABS: 20.0000 mg | ORAL_TABLET | Freq: Every day | ORAL | Status: DC

## 2015-06-23 MED ORDER — HYDROCHLOROTHIAZIDE 25 MG PO TABS
25.0000 mg | ORAL_TABLET | Freq: Every day | ORAL | Status: DC
  Administered 2015-06-23: 25 mg via ORAL
  Filled 2015-06-23: qty 1

## 2015-06-23 MED ORDER — METFORMIN HCL 500 MG PO TABS
500.0000 mg | ORAL_TABLET | Freq: Once | ORAL | Status: AC
  Administered 2015-06-23: 500 mg via ORAL
  Filled 2015-06-23: qty 1

## 2015-06-23 MED ORDER — METFORMIN HCL 500 MG PO TABS: 500.0000 mg | ORAL_TABLET | Freq: Every day | ORAL | Status: DC

## 2015-06-23 MED ORDER — AMLODIPINE BESYLATE 10 MG PO TABS: 10.0000 mg | ORAL_TABLET | Freq: Every day | ORAL | Status: DC

## 2015-06-23 NOTE — Discharge Instructions (Signed)
Hypertension Mr. Jose Morrow, take medication as directed and see a primary care doctor within 3 days for close follow up of your blood pressure.  Come back to the ED immediately if any symptoms worsen.  Thank you. Hypertension is another name for high blood pressure. High blood pressure forces your heart to work harder to pump blood. A blood pressure reading has two numbers, which includes a higher number over a lower number (example: 110/72). HOME CARE   Have your blood pressure rechecked by your doctor.  Only take medicine as told by your doctor. Follow the directions carefully. The medicine does not work as well if you skip doses. Skipping doses also puts you at risk for problems.  Do not smoke.  Monitor your blood pressure at home as told by your doctor. GET HELP IF:  You think you are having a reaction to the medicine you are taking.  You have repeat headaches or feel dizzy.  You have puffiness (swelling) in your ankles.  You have trouble with your vision. GET HELP RIGHT AWAY IF:   You get a very bad headache and are confused.  You feel weak, numb, or faint.  You get chest or belly (abdominal) pain.  You throw up (vomit).  You cannot breathe very well. MAKE SURE YOU:   Understand these instructions.  Will watch your condition.  Will get help right away if you are not doing well or get worse.   This information is not intended to replace advice given to you by your health care provider. Make sure you discuss any questions you have with your health care provider.   Document Released: 08/06/2007 Document Revised: 02/22/2013 Document Reviewed: 12/10/2012 Elsevier Interactive Patient Education [employer]

## 2015-06-23 NOTE — ED Provider Notes (Signed)
CSN: [id number]     Arrival date & time 06/22/15  1847 History  By signing my name below, I, Phillis HaggisGabriella Gaje, attest that this documentation has been prepared under the direction and in the presence of Tomasita CrumbleAdeleke Yanni Quiroa, MD. Electronically Signed: Phillis HaggisGabriella Gaje, ED Scribe. 06/23/2015. 1:03 AM.   Chief Complaint  Patient presents with  . Hypertension   The history is provided by the patient. No language interpreter was used.  HPI Comments: Jose Morrow is a 50 y.o. male with a hx of HTN and DM who presents to the Emergency Department complaining of increased BP onset earlier today. Pt was sent from his cardiologist for his increased BP. He reports that he has not been able to take his medication in over 4 months. Pt takes Lantus and Metformin for his DM; he was previously on HCTZ 25 mg, Norvasc 10 mg and Losartan 50 mg for his HTN. He reports mild headache. He denies chest pain or SOB.   Past Medical History  Diagnosis Date  . Diabetes mellitus   . Hypertension    History reviewed. No pertinent past surgical history. No family history on file. Social History  Substance Use Topics  . Smoking status: Current Every Day Smoker  . Smokeless tobacco: Never Used  . Alcohol Use: No    Review of Systems 10 Systems reviewed and all are negative for acute change except as noted in the HPI.  Allergies  Penicillins  Home Medications   Prior to Admission medications   Medication Sig Start Date End Date Taking? Authorizing Provider  clindamycin (CLEOCIN) 150 MG capsule Take 1 capsule (150 mg total) by mouth every 6 (six) hours. Patient not taking: Reported on 06/23/2015 07/24/14   Blane OharaJoshua Zavitz, MD   BP 187/119 mmHg  Pulse 62  Temp(Src) 98.8 F (37.1 C) (Oral)  Resp 21  Ht 5\' 9"  (1.753 m)  Wt 192 lb (87.091 kg)  BMI 28.34 kg/m2  SpO2 96% Physical Exam  Constitutional: He is oriented to person, place, and time. Vital signs are normal. He appears well-developed and well-nourished.  Non-toxic  appearance. He does not appear ill. No distress.  HENT:  Head: Normocephalic and atraumatic.  Nose: Nose normal.  Mouth/Throat: Oropharynx is clear and moist. No oropharyngeal exudate.  Eyes: Conjunctivae and EOM are normal. Pupils are equal, round, and reactive to light. No scleral icterus.  Neck: Normal range of motion. Neck supple. No tracheal deviation, no edema, no erythema and normal range of motion present. No thyroid mass and no thyromegaly present.  Cardiovascular: Normal rate, regular rhythm, S1 normal, S2 normal, normal heart sounds, intact distal pulses and normal pulses.  Exam reveals no gallop and no friction rub.   No murmur heard. Pulmonary/Chest: Effort normal and breath sounds normal. No respiratory distress. He has no wheezes. He has no rhonchi. He has no rales.  Abdominal: Soft. Normal appearance and bowel sounds are normal. He exhibits no distension, no ascites and no mass. There is no hepatosplenomegaly. There is no tenderness. There is no rebound, no guarding and no CVA tenderness.  Musculoskeletal: Normal range of motion. He exhibits no edema or tenderness.  Lymphadenopathy:    He has no cervical adenopathy.  Neurological: He is alert and oriented to person, place, and time. He has normal strength. No cranial nerve deficit or sensory deficit.  Normal strength and sensation in all extremities.  Normal cerebellar testing.  Skin: Skin is warm, dry and intact. No petechiae and no rash noted. He is  not diaphoretic. No erythema. No pallor.  Psychiatric: He has a normal mood and affect. His behavior is normal. Judgment normal.  Nursing note and vitals reviewed.   ED Course  Procedures (including critical care time) DIAGNOSTIC STUDIES: Oxygen Saturation is 96% on RA, normal by my interpretation.    COORDINATION OF CARE: 1:02 AM-Discussed treatment plan which includes labs, EKG, and prescriptions for medications with pt at bedside and pt agreed to plan.    Labs  Review Labs Reviewed  COMPREHENSIVE METABOLIC PANEL - Abnormal; Notable for the following:    Glucose, Bld 356 (*)    ALT 14 (*)    All other components within normal limits  URINALYSIS, ROUTINE W REFLEX MICROSCOPIC (NOT AT [facility]) - Abnormal; Notable for the following:    Specific Gravity, Urine 1.039 (*)    Glucose, UA >1000 (*)    All other components within normal limits  URINE MICROSCOPIC-ADD ON - Abnormal; Notable for the following:    Squamous Epithelial / LPF 0-5 (*)    Bacteria, UA RARE (*)    All other components within normal limits  CBG MONITORING, ED - Abnormal; Notable for the following:    Glucose-Capillary 330 (*)    All other components within normal limits  CBC  TROPONIN I    Imaging Review No results found. I have personally reviewed and evaluated these images and lab results as part of my medical decision-making.   EKG Interpretation   Date/Time:  Friday June 22 2015 19:59:15 EDT Ventricular Rate:  69 PR Interval:  162 QRS Duration: 114 QT Interval:  402 QTC Calculation: 430 R Axis:   36 Text Interpretation:  Normal sinus rhythm Incomplete right bundle branch  block Septal infarct , age undetermined Abnormal ECG No significant change  since last tracing Confirmed by Erroll Luna [phone]) on 06/23/2015  12:43:52 AM      MDM   Final diagnoses:  None    Patient presents to the ED for hypertension.  He denies any symptoms of hypertensive emergency.  Will place back on home meds and provide Rx.  PCP fu advised.  He appears well and in NAD.  VS remain within his normal limits and he is safe for DC.   I personally performed the services described in this documentation, which was scribed in my presence. The recorded information has been reviewed and is accurate.     Tomasita Crumble, MD 06/23/15 [phone]

## 2016-05-08 DIAGNOSIS — Z9114 Patient's other noncompliance with medication regimen: Secondary | ICD-10-CM | POA: Diagnosis not present

## 2016-05-08 DIAGNOSIS — I10 Essential (primary) hypertension: Secondary | ICD-10-CM | POA: Diagnosis not present

## 2016-05-08 DIAGNOSIS — E782 Mixed hyperlipidemia: Secondary | ICD-10-CM | POA: Diagnosis not present

## 2016-05-08 DIAGNOSIS — E1165 Type 2 diabetes mellitus with hyperglycemia: Secondary | ICD-10-CM | POA: Diagnosis not present

## 2017-01-12 ENCOUNTER — Other Ambulatory Visit: Payer: Self-pay

## 2017-01-12 ENCOUNTER — Emergency Department (HOSPITAL_COMMUNITY)
Admission: EM | Admit: 2017-01-12 | Discharge: 2017-01-12 | Disposition: A | Discharge status: Home / Self Care | Payer: BLUE CROSS/BLUE SHIELD | Attending: Emergency Medicine | Admitting: Emergency Medicine

## 2017-01-12 ENCOUNTER — Encounter (HOSPITAL_COMMUNITY): Payer: Self-pay | Admitting: Student

## 2017-01-12 DIAGNOSIS — E119 Type 2 diabetes mellitus without complications: Secondary | ICD-10-CM | POA: Diagnosis not present

## 2017-01-12 DIAGNOSIS — Z794 Long term (current) use of insulin: Secondary | ICD-10-CM | POA: Insufficient documentation

## 2017-01-12 DIAGNOSIS — M545 Low back pain, unspecified: Secondary | ICD-10-CM

## 2017-01-12 DIAGNOSIS — Z79899 Other long term (current) drug therapy: Secondary | ICD-10-CM | POA: Diagnosis not present

## 2017-01-12 DIAGNOSIS — F17210 Nicotine dependence, cigarettes, uncomplicated: Secondary | ICD-10-CM | POA: Diagnosis not present

## 2017-01-12 DIAGNOSIS — I10 Essential (primary) hypertension: Secondary | ICD-10-CM

## 2017-01-12 MED ORDER — AMLODIPINE BESYLATE 5 MG PO TABS
10.0000 mg | ORAL_TABLET | Freq: Once | ORAL | Status: AC
  Administered 2017-01-12: 10 mg via ORAL
  Filled 2017-01-12: qty 2

## 2017-01-12 MED ORDER — CYCLOBENZAPRINE HCL 10 MG PO TABS: 10.0000 mg | ORAL_TABLET | Freq: Two times a day (BID) | ORAL | 0 refills | Status: DC | PRN

## 2017-01-12 MED ORDER — AMLODIPINE BESYLATE 10 MG PO TABS: 10.0000 mg | ORAL_TABLET | Freq: Every day | ORAL | 3 refills | Status: DC

## 2017-01-12 MED ORDER — AMLODIPINE BESYLATE 10 MG PO TABS: 10.0000 mg | ORAL_TABLET | Freq: Every day | ORAL | 0 refills | Status: DC

## 2017-01-12 NOTE — Discharge Instructions (Signed)
Please read and follow all provided instructions.  Your diagnoses today include:  1. Acute left-sided low back pain without sciatica   2. Hypertension, unspecified type     Tests performed today include: Vital signs - see below for your results today  Medications prescribed:   Take any prescribed medications only as directed.  Home care instructions:  Follow any educational materials contained in this packet Please rest, use ice or heat on your back for the next several days Do not lift, push, pull anything more than 10 pounds for the next week  Follow-up instructions: Please follow-up with your primary care provider in the next 1 week for further evaluation of your symptoms.   Return instructions:  SEEK IMMEDIATE MEDICAL ATTENTION IF YOU HAVE: New numbness, tingling, weakness, or problem with the use of your arms or legs Severe back pain not relieved with medications Loss control of your bowels or bladder Increasing pain in any areas of the body (such as chest or abdominal pain) Shortness of breath, dizziness, or fainting.  Worsening nausea (feeling sick to your stomach), vomiting, fever, or sweats Any other emergent concerns regarding your health   Additional Information:  Your vital signs today were: BP (!) 211/114    Pulse 76    Temp 97.7 F (36.5 C) (Oral)    Resp 18    SpO2 98%  If your blood pressure (BP) was elevated above 135/85 this visit, please have this repeated by your doctor within one month. --------------
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
## 2017-01-12 NOTE — ED Provider Notes (Signed)
[facility]   CSN: [id number] Arrival date & time: 01/12/17  0913     History   Chief Complaint Chief Complaint  Patient presents with  . Back Pain    HPI Jose Morrow L Malmstrom is a 51 y.o. male.  HPI  51 y.o. male with a hx of DM, HTN, presents to the Emergency Department today due to lower back pain with onset on Friday. Notes scaffolding falling on pt when lifting at work. Went to UC and had imaging done, which was unremarkable. Sent home with Rx muscle relaxants, which patient did not take. Denies numbness/tingling. No loss of bowel or bladder function. No saddle anesthesia. No CP/SOB/ABD pain. No N/V. No headaches. Pt known HTN. On Amlodipine, but ran out on Friday. No other symptoms noted    Past Medical History:  Diagnosis Date  . Diabetes mellitus   . Hypertension     There are no active problems to display for this patient.   History reviewed. No pertinent surgical history.     Home Medications    Prior to Admission medications   Medication Sig Start Date End Date Taking? Authorizing Provider  amLODipine (NORVASC) 10 MG tablet Take 1 tablet (10 mg total) by mouth daily. 06/23/15  Yes Tomasita Crumbleni, Adeleke, MD  insulin glargine (LANTUS) 100 UNIT/ML injection Inject 0.8 mLs (80 Units total) into the skin every morning. 06/23/15  Yes Oni, Adeleke, MD  lovastatin (MEVACOR) 20 MG tablet Take 1 tablet (20 mg total) by mouth at bedtime. 06/23/15  Yes Tomasita Crumbleni, Adeleke, MD  metFORMIN (GLUCOPHAGE) 500 MG tablet Take 1 tablet (500 mg total) by mouth at bedtime. 06/23/15  Yes Tomasita Crumbleni, Adeleke, MD  hydrochlorothiazide (HYDRODIURIL) 25 MG tablet Take 1 tablet (25 mg total) by mouth daily. Patient not taking: Reported on 01/12/2017 06/23/15   Tomasita Crumbleni, Adeleke, MD    Family History History reviewed. No pertinent family history.  Social History Social History   Tobacco Use  . Smoking status: Current Every Day Smoker  . Smokeless tobacco: Never Used    Substance Use Topics  . Alcohol use: No  . Drug use: No     Allergies   Penicillins   Review of Systems Review of Systems ROS reviewed and all are negative for acute change except as noted in the HPI.  Physical Exam Updated Vital Signs BP (!) 211/114   Pulse 76   Temp 97.7 F (36.5 C) (Oral)   Resp 18   SpO2 98%   Physical Exam  Constitutional: Vital signs are normal. He appears well-developed and well-nourished. No distress.  HENT:  Head: Normocephalic and atraumatic. Head is without raccoon's eyes and without Battle's sign.  Right Ear: No hemotympanum.  Left Ear: No hemotympanum.  Nose: Nose normal.  Mouth/Throat: Uvula is midline, oropharynx is clear and moist and mucous membranes are normal.  Eyes: EOM are normal. Pupils are equal, round, and reactive to light.  Neck: Trachea normal and normal range of motion. Neck supple. No spinous process tenderness and no muscular tenderness present. No tracheal deviation and normal range of motion present.  Cardiovascular: Normal rate, regular rhythm, S1 normal, S2 normal, normal heart sounds, intact distal pulses and normal pulses.  Pulmonary/Chest: Effort normal and breath sounds normal. No respiratory distress. He has no decreased breath sounds. He has no wheezes. He has no rhonchi. He has no rales.  Abdominal: Normal appearance and bowel sounds are normal. There is no tenderness. There is no rigidity and no  guarding.  Musculoskeletal: Normal range of motion.  TTP left lower lumbar musculature. No midline spinous process tenderness   Neurological: He is alert. He has normal strength. No cranial nerve deficit or sensory deficit.  Skin: Skin is warm and dry.  Psychiatric: He has a normal mood and affect. His speech is normal and behavior is normal.  Nursing note and vitals reviewed.    ED Treatments / Results  Labs (all labs ordered are listed, but only abnormal results are displayed) Labs Reviewed - No data to  display  EKG  EKG Interpretation None       Radiology No results found.  Procedures Procedures (including critical care time)  Medications Ordered in ED Medications  amLODipine (NORVASC) tablet 10 mg (not administered)     Initial Impression / Assessment and Plan / ED Course  I have reviewed the triage vital signs and the nursing notes.  Pertinent labs & imaging results that were available during my care of the patient were reviewed by me and considered in my medical decision making (see chart for details).  Final Clinical Impressions(s) / ED Diagnoses     {I have reviewed the relevant previous healthcare records.  {I obtained HPI from historian.   ED Course:  Assessment: Patient is a 51 y.o. male with a hx of DM, HTN, presents to the Emergency Department today due to lower back pain with onset on Friday. Notes scaffolding falling on pt when lifting at work. Went to UC and had imaging done, which was unremarkable. Sent home with Rx muscle relaxants, which patient did not take. Denies numbness/tingling. No loss of bowel or bladder function. No saddle anesthesia. No CP/SOB/ABD pain. No N/V. No headaches. Pt known HTN. On Amlodipine, but ran out on Friday. No neurological deficits appreciated. Patient is ambulatory. No warning symptoms of back pain including: fecal incontinence, urinary retention or overflow incontinence, night sweats, waking from sleep with back pain, unexplained fevers or weight loss, h/o cancer, IVDU, recent trauma. No concern for cauda equina, epidural abscess, or other serious cause of back pain. Conservative measures such as rest, ice/heat and pain medicine indicated with PCP follow-up if no improvement with conservative management. Pt known HTN. Out of Amlodipine. Given Refill and close follow up to PCP.   Disposition/Plan:  DC Home Additional Verbal discharge instructions given and discussed with patient.  Pt Instructed to f/u with PCP in the next week for  evaluation and treatment of symptoms. Return precautions given Pt acknowledges and agrees with plan  Supervising Physician Melene PlanFloyd, Dan, DO  Final diagnoses:  Acute left-sided low back pain without sciatica  Hypertension, unspecified type    ED Discharge Orders    None       Audry PiliMohr, Adison Jerger, PA-C 01/12/17 1216    Melene PlanFloyd, Dan, DO 01/12/17 1303

## 2017-12-13 DIAGNOSIS — Z794 Long term (current) use of insulin: Secondary | ICD-10-CM | POA: Insufficient documentation

## 2017-12-13 DIAGNOSIS — E119 Type 2 diabetes mellitus without complications: Secondary | ICD-10-CM | POA: Insufficient documentation

## 2017-12-13 DIAGNOSIS — F17200 Nicotine dependence, unspecified, uncomplicated: Secondary | ICD-10-CM | POA: Insufficient documentation

## 2017-12-13 DIAGNOSIS — H00014 Hordeolum externum left upper eyelid: Secondary | ICD-10-CM | POA: Diagnosis not present

## 2017-12-13 DIAGNOSIS — I10 Essential (primary) hypertension: Secondary | ICD-10-CM | POA: Diagnosis not present

## 2017-12-13 DIAGNOSIS — Z760 Encounter for issue of repeat prescription: Secondary | ICD-10-CM | POA: Diagnosis not present

## 2017-12-13 DIAGNOSIS — R0602 Shortness of breath: Secondary | ICD-10-CM | POA: Diagnosis not present

## 2017-12-13 NOTE — ED Notes (Signed)
Pt reports out of his diabetic medications for 2 or 3 months. No feeling well.

## 2017-12-14 ENCOUNTER — Emergency Department: Payer: BLUE CROSS/BLUE SHIELD

## 2017-12-14 ENCOUNTER — Other Ambulatory Visit: Payer: Self-pay

## 2017-12-14 ENCOUNTER — Emergency Department
Admission: EM | Admit: 2017-12-14 | Discharge: 2017-12-14 | Disposition: A | Discharge status: Home / Self Care | Payer: BLUE CROSS/BLUE SHIELD | Attending: Emergency Medicine | Admitting: Emergency Medicine

## 2017-12-14 DIAGNOSIS — Z760 Encounter for issue of repeat prescription: Secondary | ICD-10-CM

## 2017-12-14 DIAGNOSIS — R0602 Shortness of breath: Secondary | ICD-10-CM | POA: Diagnosis not present

## 2017-12-14 DIAGNOSIS — I10 Essential (primary) hypertension: Secondary | ICD-10-CM

## 2017-12-14 DIAGNOSIS — H00014 Hordeolum externum left upper eyelid: Secondary | ICD-10-CM

## 2017-12-14 LAB — CBC WITH DIFFERENTIAL/PLATELET
Abs Immature Granulocytes: 0.03 10*3/uL (ref 0.00–0.07)
BASOS ABS: 0.1 10*3/uL (ref 0.0–0.1)
Basophils Relative: 1 %
Eosinophils Absolute: 0.1 10*3/uL (ref 0.0–0.5)
Eosinophils Relative: 1 %
HCT: 44 % (ref 39.0–52.0)
Hemoglobin: 14.8 g/dL (ref 13.0–17.0)
Immature Granulocytes: 0 %
LYMPHS ABS: 1.3 10*3/uL (ref 0.7–4.0)
LYMPHS PCT: 16 %
MCH: 27.7 pg (ref 26.0–34.0)
MCHC: 33.6 g/dL (ref 30.0–36.0)
MCV: 82.4 fL (ref 80.0–100.0)
Monocytes Absolute: 0.6 10*3/uL (ref 0.1–1.0)
Monocytes Relative: 8 %
NRBC: 0 % (ref 0.0–0.2)
Neutro Abs: 6.1 10*3/uL (ref 1.7–7.7)
Neutrophils Relative %: 74 %
Platelets: 196 10*3/uL (ref 150–400)
RBC: 5.34 MIL/uL (ref 4.22–5.81)
RDW: 13.6 % (ref 11.5–15.5)
WBC: 8.2 10*3/uL (ref 4.0–10.5)

## 2017-12-14 LAB — BASIC METABOLIC PANEL
Anion gap: 6 (ref 5–15)
BUN: 22 mg/dL — ABNORMAL HIGH (ref 6–20)
CALCIUM: 8.6 mg/dL — AB (ref 8.9–10.3)
CHLORIDE: 109 mmol/L (ref 98–111)
CO2: 24 mmol/L (ref 22–32)
CREATININE: 1.16 mg/dL (ref 0.61–1.24)
GFR calc Af Amer: >60 mL/min (ref >60)
GFR calc non Af Amer: >60 mL/min (ref >60)
Glucose, Bld: 112 mg/dL — ABNORMAL HIGH (ref 70–99)
Potassium: 4.9 mmol/L (ref 3.5–5.1)
Sodium: 139 mmol/L (ref 135–145)

## 2017-12-14 LAB — HEMOGLOBIN A1C
HEMOGLOBIN A1C: 6.5 % — AB (ref 4.8–5.6)
MEAN PLASMA GLUCOSE: 139.85 mg/dL

## 2017-12-14 LAB — TROPONIN I: Troponin I: <0.03 ng/mL (ref <0.03)

## 2017-12-14 LAB — GLUCOSE, CAPILLARY: Glucose-Capillary: 144 mg/dL — ABNORMAL HIGH (ref 70–99)

## 2017-12-14 MED ORDER — AMLODIPINE BESYLATE 10 MG PO TABS: 10.0000 mg | ORAL_TABLET | Freq: Every day | ORAL | 11 refills | Status: DC

## 2017-12-14 MED ORDER — AMLODIPINE BESYLATE 5 MG PO TABS
10.0000 mg | ORAL_TABLET | Freq: Once | ORAL | Status: AC
  Administered 2017-12-14: 10 mg via ORAL
  Filled 2017-12-14: qty 2

## 2017-12-14 MED ORDER — METFORMIN HCL 500 MG PO TABS: 500.0000 mg | ORAL_TABLET | Freq: Every day | ORAL | 11 refills | Status: DC

## 2017-12-14 NOTE — ED Triage Notes (Addendum)
Patient states he has been out of his diabetic medication (lantus and a pill) and blood pressure medication for approximately 2 months.

## 2017-12-14 NOTE — Discharge Instructions (Addendum)
Your workup in the Emergency Department today was reassuring.  We did not find any specific abnormalities.  We recommend you drink plenty of fluids, take your regular medications and/or any new ones prescribed today, and follow up with the doctor(s) listed in these documents as recommended.  Return to the Emergency Department if you develop new or worsening symptoms that concern you.  

## 2017-12-14 NOTE — ED Provider Notes (Signed)
[facility] Emergency Department Provider Note  ____________________________________________   First MD Initiated Contact with Patient 12/14/17 0151     (approximate)  I have reviewed the triage vital signs and the nursing notes.   HISTORY  Chief Complaint Medication Refill    HPI Jose Morrow is a 52 y.o. male with medical history as listed below who presents for evaluation of an acute onset and severe episode of shortness of breath which is completely resolved.  He believes it is because he has not been taking his blood pressure or diabetes medicines for a couple of months since he ran out and has not had money to go to the doctor.  He states that tonight he was talking with his fiance and while they were talking he became acutely short of breath with some tingling in his fingers.  All of that resolved and he no longer feels short of breath.  He has not had any chest pain, nausea, vomiting, fever/chills, nor abdominal pain.  Nothing in particular made the symptoms better or worse.  He used to take amlodipine 10 mg by mouth daily and before that was also on HCTZ but he reports that his doctor took him off HCTZ and he was only taking amlodipine.  He is also taking Lantus and metformin but he was only taking the metformin once daily at bedtime and only 500 mg.  He denies recent increased thirst or urination, no focal numbness nor weakness, no headache.  Past Medical History:  Diagnosis Date  . Diabetes mellitus   . Hypertension     There are no active problems to display for this patient.   No past surgical history on file.  Prior to Admission medications   Medication Sig Start Date End Date Taking? Authorizing Provider  amLODipine (NORVASC) 10 MG tablet Take 1 tablet (10 mg total) by mouth daily. 12/14/17 12/14/18  Loleta Rose, MD  cyclobenzaprine (FLEXERIL) 10 MG tablet Take 1 tablet (10 mg total) 2 (two) times daily as needed by mouth for muscle  spasms. 01/12/17   Audry Pili, PA-C  hydrochlorothiazide (HYDRODIURIL) 25 MG tablet Take 1 tablet (25 mg total) by mouth daily. Patient not taking: Reported on 01/12/2017 06/23/15   Tomasita Crumble, MD  insulin glargine (LANTUS) 100 UNIT/ML injection Inject 0.8 mLs (80 Units total) into the skin every morning. 06/23/15   Tomasita Crumble, MD  lovastatin (MEVACOR) 20 MG tablet Take 1 tablet (20 mg total) by mouth at bedtime. 06/23/15   Tomasita Crumble, MD  metFORMIN (GLUCOPHAGE) 500 MG tablet Take 1 tablet (500 mg total) by mouth at bedtime. 12/14/17 12/14/18  Loleta Rose, MD    Allergies Penicillins  No family history on file.  Social History Social History   Tobacco Use  . Smoking status: Current Every Day Smoker  . Smokeless tobacco: Never Used  Substance Use Topics  . Alcohol use: No  . Drug use: No    Review of Systems Constitutional: No fever/chills Eyes: No visual changes. ENT: No sore throat. Cardiovascular: Denies chest pain. Respiratory: Acute onset severe shortness of breath completely resolved. Gastrointestinal: No abdominal pain.  No nausea, no vomiting.  No diarrhea.  No constipation. Genitourinary: Negative for dysuria. Musculoskeletal: Negative for neck pain.  Negative for back pain. Integumentary: Negative for rash. Neurological: Negative for headaches, focal weakness or numbness.   ____________________________________________   PHYSICAL EXAM:  VITAL SIGNS: ED Triage Vitals [12/14/17 0005]  Enc Vitals Group     BP (!) 200/112  Pulse Rate 72     Resp 14     Temp 98 F (36.7 C)     Temp Source Oral     SpO2 95 %     Weight 73.9 kg (163 lb)     Height 1.753 m (5\' 9" )     Head Circumference      Peak Flow      Pain Score 0     Pain Loc      Pain Edu?      Excl. in GC?     Constitutional: Alert and oriented. Well appearing and in no acute distress. Eyes: Conjunctivae are normal.  He has a stye on the left upper lid that just started earlier today.   No eye pain.  Some irritation of the eyelid. Head: Atraumatic. Nose: No congestion/rhinnorhea. Mouth/Throat: Mucous membranes are moist. Neck: No stridor.  No meningeal signs.   Cardiovascular: Normal rate, regular rhythm. Good peripheral circulation. Grossly normal heart sounds. Respiratory: Normal respiratory effort.  No retractions. Lungs CTAB. Gastrointestinal: Soft and nontender. No distention.  Musculoskeletal: No lower extremity tenderness nor edema. No gross deformities of extremities. Neurologic:  Normal speech and language. No gross focal neurologic deficits are appreciated.  Skin:  Skin is warm, dry and intact. No rash noted. Psychiatric: Mood and affect are normal. Speech and behavior are normal.  ____________________________________________   LABS (all labs ordered are listed, but only abnormal results are displayed)  Labs Reviewed  GLUCOSE, CAPILLARY - Abnormal; Notable for the following components:      Result Value   Glucose-Capillary 144 (*)    All other components within normal limits  BASIC METABOLIC PANEL - Abnormal; Notable for the following components:   Glucose, Bld 112 (*)    BUN 22 (*)    Calcium 8.6 (*)    All other components within normal limits  CBC WITH DIFFERENTIAL/PLATELET  TROPONIN I  HEMOGLOBIN A1C  CBG MONITORING, ED   ____________________________________________  EKG  ED ECG REPORT I, Loleta Rose, the attending physician, personally viewed and interpreted this ECG.  Date: 12/14/2017 EKG Time: 2:24 AM Rate: 72 Rhythm: First-degree AV block, sinus rhythm QRS Axis: normal Intervals: PR interval is 215 ms ST/T Wave abnormalities: T wave inversions in leads V4, V5, and V6 as well as lead II. Narrative Interpretation: no definitive evidence of acute ischemia.  T waves have changed since the last EKG on record which was in 2017   ____________________________________________  RADIOLOGY I, Loleta Rose, personally viewed and evaluated  these images (plain radiographs) as part of my medical decision making, as well as reviewing the written report by the radiologist.  ED MD interpretation: Cardiomegaly but no acute abnormalities on chest x-ray  Official radiology report(s): Dg Chest 2 View  Result Date: 12/14/2017 CLINICAL DATA:  Shortness of breath EXAM: CHEST - 2 VIEW COMPARISON:  None. FINDINGS: Cardiomegaly without pulmonary edema or focal airspace consolidation. No pneumothorax or pleural effusion. IMPRESSION: Cardiomegaly without acute airspace disease. Electronically Signed   By: Deatra Robinson M.D.   On: 12/14/2017 03:26    ____________________________________________   PROCEDURES  Critical Care performed: No   Procedure(s) performed:   Procedures   ____________________________________________   INITIAL IMPRESSION / ASSESSMENT AND PLAN / ED COURSE  As part of my medical decision making, I reviewed the following data within the electronic MEDICAL RECORD NUMBER Nursing notes reviewed and incorporated, Labs reviewed , EKG interpreted , Old EKG reviewed, Radiograph reviewed  and Notes from prior ED visits  Differential diagnosis includes, but is not limited to, essential hypertension, nonspecific acute shortness of breath but may be related to a panic attack and hyperventilation based on the symptoms, acute or subacute renal insufficiency in the setting of untreated hypertension.  He is well-appearing in no acute distress with normal vital signs except for the significant hypertension.  However this is likely been present for a couple of months.   Given the degree of hypertension and the episode that he describes which I think is most likely a panic or anxiety attack with some hyperventilation that has completely resolved, I will check a chest x-ray, EKG, and basic lab work including electrolytes, CBC, troponin.  I am giving him a dose of the amlodipine 10 mg he took previously.  It is reassuring that his  fingerstick blood sugar was only about 140 and I will start him back on insulin tonight but because he has a longstanding history of well-documented diabetes I will start him back on his metformin.  I have also ordered a hemoglobin A1c to help him with outpatient follow-up.  He claims that he is working again and does want to follow-up with a doctor so I will give him information about how to get started back with a PCP.  If his work-up is reassuring he will be able to be discharged for outpatient follow-up.  He agrees with the plan.  Update: Even though the EKG does have some changes when compared to prior, he is having no chest pain or shortness of breath and I think that these are likely chronic and do not represent any acute abnormality.  His blood work is all reassuring with no evidence of renal dysfunction, endorgan dysfunction in general, or demand ischemia (troponin is normal).  Clinical Course as of Dec 14 637  Mon Dec 14, 2017  0307 Patient's lab work is all reassuring with no evidence of any acute abnormality.  He is asymptomatic at this time I think it is appropriate to discharge with prescriptions for amlodipine and metformin as described above.  I have once again encouraged him to follow-up with primary care doctor at the next available opportunity and he says he understands.  I gave my usual and customary return precautions.   [CF]    Clinical Course User Index [CF] Loleta Rose, MD    ____________________________________________  FINAL CLINICAL IMPRESSION(S) / ED DIAGNOSES  Final diagnoses:  Medication refill  Hordeolum externum of left upper eyelid  Essential hypertension     MEDICATIONS GIVEN DURING THIS VISIT:  Medications  amLODipine (NORVASC) tablet 10 mg (10 mg Oral Given 12/14/17 0221)     ED Discharge Orders         Ordered    amLODipine (NORVASC) 10 MG tablet  Daily     12/14/17 0309    metFORMIN (GLUCOPHAGE) 500 MG tablet  Daily at bedtime     12/14/17  0309           Note:  This document was prepared using Dragon voice recognition software and may include unintentional dictation errors.    Loleta Rose, MD 12/14/17 [phone]

## 2017-12-14 NOTE — ED Notes (Signed)
Dr. York Cerise notified of continued hypertension 212/115, no new orders received prior to discharge. md orders discharge with this knowledge.

## 2017-12-14 NOTE — ED Notes (Signed)
Assessment: pt states he is here for a refill of his norvasc, metformin and lantus. Pt states he has not taken his prescribed medication in 2 months. Pt denies headache, chest pain, shob or other symptoms.

## 2018-01-22 ENCOUNTER — Telehealth: Payer: Self-pay

## 2018-01-22 NOTE — Telephone Encounter (Signed)
Attempted to call pt to see If he is still coming to this office for his diabetes unable to leave aa v/m at this time. YRL,RMA

## 2019-11-23 ENCOUNTER — Other Ambulatory Visit: Payer: Self-pay

## 2019-11-23 ENCOUNTER — Emergency Department (HOSPITAL_COMMUNITY)
Admission: EM | Admit: 2019-11-23 | Discharge: 2019-11-23 | Disposition: A | Discharge status: Home / Self Care | Payer: BLUE CROSS/BLUE SHIELD | Attending: Emergency Medicine | Admitting: Emergency Medicine

## 2019-11-23 ENCOUNTER — Encounter (HOSPITAL_COMMUNITY): Payer: Self-pay | Admitting: Emergency Medicine

## 2019-11-23 DIAGNOSIS — J069 Acute upper respiratory infection, unspecified: Secondary | ICD-10-CM | POA: Diagnosis not present

## 2019-11-23 DIAGNOSIS — E119 Type 2 diabetes mellitus without complications: Secondary | ICD-10-CM | POA: Insufficient documentation

## 2019-11-23 DIAGNOSIS — Z20822 Contact with and (suspected) exposure to covid-19: Secondary | ICD-10-CM | POA: Insufficient documentation

## 2019-11-23 DIAGNOSIS — Z79899 Other long term (current) drug therapy: Secondary | ICD-10-CM | POA: Diagnosis not present

## 2019-11-23 DIAGNOSIS — R05 Cough: Secondary | ICD-10-CM | POA: Diagnosis present

## 2019-11-23 DIAGNOSIS — Z794 Long term (current) use of insulin: Secondary | ICD-10-CM | POA: Diagnosis not present

## 2019-11-23 DIAGNOSIS — I10 Essential (primary) hypertension: Secondary | ICD-10-CM | POA: Insufficient documentation

## 2019-11-23 DIAGNOSIS — F17200 Nicotine dependence, unspecified, uncomplicated: Secondary | ICD-10-CM | POA: Insufficient documentation

## 2019-11-23 LAB — SARS CORONAVIRUS 2 BY RT PCR (HOSPITAL ORDER, PERFORMED IN ~~LOC~~ HOSPITAL LAB): SARS Coronavirus 2: NEGATIVE

## 2019-11-23 MED ORDER — AMLODIPINE BESYLATE 5 MG PO TABS
10.0000 mg | ORAL_TABLET | Freq: Every day | ORAL | Status: DC
  Administered 2019-11-23: 10 mg via ORAL
  Filled 2019-11-23: qty 2

## 2019-11-23 MED ORDER — HYDROCHLOROTHIAZIDE 25 MG PO TABS
25.0000 mg | ORAL_TABLET | Freq: Every day | ORAL | Status: DC
  Administered 2019-11-23: 25 mg via ORAL
  Filled 2019-11-23: qty 1

## 2019-11-23 MED ORDER — HYDROCHLOROTHIAZIDE 25 MG PO TABS: 25.0000 mg | ORAL_TABLET | Freq: Every day | ORAL | 3 refills | Status: DC

## 2019-11-23 MED ORDER — AMLODIPINE BESYLATE 10 MG PO TABS: 10.0000 mg | ORAL_TABLET | Freq: Every day | ORAL | 11 refills | Status: DC

## 2019-11-23 NOTE — Discharge Instructions (Addendum)
It was a pleasure taking care of you today.    Your Covid test today was negative.  You may take over-the-counter Mucinex, flonase, zyrtec, ibuprofen/tylenol to help with your symptoms.  Make sure you drink plenty of fluids.  As discussed, your blood pressure is incredibly high today.  Please continue to take your medications, and it is very important that you establish with a primary care doctor.  Please call the clinic [facility] tomorrow to schedule an appointment.  Return to the ER if you have any new or worsening symptoms.

## 2019-11-23 NOTE — ED Notes (Signed)
Reviewed discharge instructions with patient. Follow-up care and medications reviewed. Patient  verbalized understanding. Patient A&Ox4, VSS, and ambulatory with steady gait upon discharge.  °

## 2019-11-23 NOTE — ED Triage Notes (Signed)
Pt c/o cough and runny nose that started yesterday.

## 2019-11-23 NOTE — ED Provider Notes (Signed)
[facility] EMERGENCY DEPARTMENT Provider Note   CSN: [id number] Arrival date & time: 11/23/19  1953     History Chief Complaint  Patient presents with  . Cough    Jose Morrow is a 54 y.o. male.  HPI 54 year old male with a history of diabetes and hypertension presents to the ER with 1 day history of runny nose and dry cough.  Patient states that last night he started to have some nasal congestion which prevented him from getting a good night sleep.  He states that he went to work this morning and developed a dry cough.  Was sent here to the ER by his employer to be checked out for Covid.  He is not vaccinated.  He denies any fevers or chills.  No loss of taste or smell.  No nausea or vomiting or diarrhea.  Patient was found to be significantly hypertensive here in the ED, denies any chest pain, shortness of breath, headache, vision changes at this time.  He states that he has not taken his blood pressure medications for approximately 2 months as him and his wife recently got divorced and he is no longer on her health insurance.  He states he is compliant with his diabetes medications.  He states that normally when he comes to the ER, his blood pressure is high, he gets his medications and his symptoms improve.    Past Medical History:  Diagnosis Date  . Diabetes mellitus   . Hypertension     There are no problems to display for this patient.   History reviewed. No pertinent surgical history.     No family history on file.  Social History   Tobacco Use  . Smoking status: Current Every Day Smoker  . Smokeless tobacco: Never Used  Substance Use Topics  . Alcohol use: No  . Drug use: No    Home Medications Prior to Admission medications   Medication Sig Start Date End Date Taking? Authorizing Provider  amLODipine (NORVASC) 10 MG tablet Take 1 tablet (10 mg total) by mouth daily. 11/23/19 11/22/20  Mare Ferrari, PA-C  cyclobenzaprine (FLEXERIL) 10 MG  tablet Take 1 tablet (10 mg total) 2 (two) times daily as needed by mouth for muscle spasms. 01/12/17   Audry Pili, PA-C  hydrochlorothiazide (HYDRODIURIL) 25 MG tablet Take 1 tablet (25 mg total) by mouth daily. 11/23/19   Mare Ferrari, PA-C  insulin glargine (LANTUS) 100 UNIT/ML injection Inject 0.8 mLs (80 Units total) into the skin every morning. 06/23/15   Tomasita Crumble, MD  lovastatin (MEVACOR) 20 MG tablet Take 1 tablet (20 mg total) by mouth at bedtime. 06/23/15   Tomasita Crumble, MD  metFORMIN (GLUCOPHAGE) 500 MG tablet Take 1 tablet (500 mg total) by mouth at bedtime. 12/14/17 12/14/18  Loleta Rose, MD    Allergies    Penicillins  Review of Systems   Review of Systems  Constitutional: Negative for chills and fever.  HENT: Positive for congestion and sinus pressure.   Eyes: Negative for visual disturbance.  Respiratory: Positive for cough. Negative for shortness of breath.   Cardiovascular: Negative for chest pain.  Gastrointestinal: Negative for abdominal pain.  Neurological: Negative for headaches.    Physical Exam Updated Vital Signs BP (!) 225/115 (BP Location: Left Arm)   Pulse 70   Temp 98.5 F (36.9 C) (Oral)   Resp 18   Ht 5\' 9"  (1.753 m)   Wt 80.7 kg   SpO2 100%  BMI 26.29 kg/m   Physical Exam Vitals and nursing note reviewed.  Constitutional:      General: He is not in acute distress.    Appearance: He is well-developed. He is not ill-appearing, toxic-appearing or diaphoretic.  HENT:     Head: Normocephalic and atraumatic.     Mouth/Throat:     Mouth: Mucous membranes are moist.     Pharynx: Oropharynx is clear.  Eyes:     Conjunctiva/sclera: Conjunctivae normal.  Cardiovascular:     Rate and Rhythm: Normal rate and regular rhythm.     Pulses: Normal pulses.     Heart sounds: Normal heart sounds. No murmur heard.   Pulmonary:     Effort: Pulmonary effort is normal. No respiratory distress.     Breath sounds: Normal breath sounds.  Abdominal:      Palpations: Abdomen is soft.     Tenderness: There is no abdominal tenderness.  Musculoskeletal:        General: Normal range of motion.     Cervical back: Neck supple.  Skin:    General: Skin is warm and dry.  Neurological:     Mental Status: He is alert.     ED Results / Procedures / Treatments   Labs (all labs ordered are listed, but only abnormal results are displayed) Labs Reviewed  SARS CORONAVIRUS 2 BY RT PCR (HOSPITAL ORDER, PERFORMED IN [facility] LAB)    EKG None  Radiology No results found.  Procedures Procedures (including critical care time)  Medications Ordered in ED Medications  amLODipine (NORVASC) tablet 10 mg (has no administration in time range)  hydrochlorothiazide (HYDRODIURIL) tablet 25 mg (has no administration in time range)    ED Course  I have reviewed the triage vital signs and the nursing notes.  Pertinent labs & imaging results that were available during my care of the patient were reviewed by me and considered in my medical decision making (see chart for details).    MDM Rules/Calculators/A&P                         Patient presents to the ER with complaints of runny nose and dry cough x1 day.Initially on presentation his blood pressure was 232/120, slightly increased to 222/115.  Oxygen sats 100% tachycardic or tachypneic.  Patient is denying any signs of hypertensive urgency/emergency, no shortness of breath, chest pain, headache, visual changes, nausea, vomiting.  His Covid test is negative.  Partook in shared  decision-making with the patient, he does not want to be worked up for hypertensive urgency/emergency as he is currently asymptomatic.  We will give him a dose of his home Norvasc and HCTZ here, will refill meds, send him to [facility].  Discussed strict return precautions.  No indication for chest x-ray to check for pneumonia given short duration of symptoms.  Patient educated on supportive care.   He voicesunderstanding and is agreeable.  At this stage in the ED course, the patient is medically screened and stable for discharge.  Final Clinical Impression(s) / ED Diagnoses Final diagnoses:  Viral URI with cough    Rx / DC Orders ED Discharge Orders         Ordered    amLODipine (NORVASC) 10 MG tablet  Daily        11/23/19 2142    hydrochlorothiazide (HYDRODIURIL) 25 MG tablet  Daily        11/23/19 2142  Leone Brand 11/23/19 2149    Tegeler, Canary Brim, MD 11/23/19 [phone]

## 2020-03-02 ENCOUNTER — Emergency Department (HOSPITAL_COMMUNITY)
Admission: EM | Admit: 2020-03-02 | Discharge: 2020-03-02 | Disposition: A | Discharge status: Home / Self Care | Payer: BLUE CROSS/BLUE SHIELD | Attending: Emergency Medicine | Admitting: Emergency Medicine

## 2020-03-02 ENCOUNTER — Emergency Department (HOSPITAL_COMMUNITY): Payer: BLUE CROSS/BLUE SHIELD

## 2020-03-02 ENCOUNTER — Other Ambulatory Visit: Payer: Self-pay

## 2020-03-02 DIAGNOSIS — W108XXA Fall (on) (from) other stairs and steps, initial encounter: Secondary | ICD-10-CM | POA: Insufficient documentation

## 2020-03-02 DIAGNOSIS — S93431A Sprain of tibiofibular ligament of right ankle, initial encounter: Secondary | ICD-10-CM | POA: Insufficient documentation

## 2020-03-02 DIAGNOSIS — Y9302 Activity, running: Secondary | ICD-10-CM | POA: Insufficient documentation

## 2020-03-02 DIAGNOSIS — E119 Type 2 diabetes mellitus without complications: Secondary | ICD-10-CM | POA: Insufficient documentation

## 2020-03-02 DIAGNOSIS — F17200 Nicotine dependence, unspecified, uncomplicated: Secondary | ICD-10-CM | POA: Insufficient documentation

## 2020-03-02 DIAGNOSIS — Y92812 Truck as the place of occurrence of the external cause: Secondary | ICD-10-CM | POA: Insufficient documentation

## 2020-03-02 DIAGNOSIS — S93491A Sprain of other ligament of right ankle, initial encounter: Secondary | ICD-10-CM

## 2020-03-02 DIAGNOSIS — I10 Essential (primary) hypertension: Secondary | ICD-10-CM | POA: Insufficient documentation

## 2020-03-02 MED ORDER — IBUPROFEN 400 MG PO TABS
600.0000 mg | ORAL_TABLET | Freq: Once | ORAL | Status: AC
  Administered 2020-03-02: 600 mg via ORAL
  Filled 2020-03-02: qty 1

## 2020-03-02 NOTE — ED Notes (Signed)
Went to discharge pt and pt is not in the room. No sign of pt. Pt educated about use of crutches by ortho. Wheelchair still outside of room. Assuming pt ambulated out with crutches.

## 2020-03-02 NOTE — ED Notes (Signed)
Ortho tech at bedside 

## 2020-03-02 NOTE — ED Triage Notes (Signed)
Pt slipped down two porch stairs last night and now has R ankle pain and swelling. Denies other injury from fall, no LOC.

## 2020-03-02 NOTE — ED Provider Notes (Signed)
[facility] EMERGENCY DEPARTMENT Provider Note   CSN: [id number] Arrival date & time: 03/02/20  3295     History Chief Complaint  Patient presents with  . Ankle Pain    Jose Morrow is a 54 y.o. male.  Jose Morrow is a 54 y.o. male with a history of hypertension and diabetes, who presents to the ED for evaluation of right ankle pain.  Patient states he was getting out of his truck last night and the running board was slick and his foot slipped, rolling his ankle.  Reports since then he has had worsening pain and swelling to the right ankle.  Localizes pain to the lateral portion of the ankle.  States pain is worse with weightbearing so he has been walking with a cane.  He has not taken anything for pain prior to arrival.  No numbness tingling or weakness.  No prior injury or surgeries.  Patient also noted to be hypertensive, states that he did not take his blood pressure medication prior to coming in today but can take this when he gets home.  The history is provided by the patient.       Past Medical History:  Diagnosis Date  . Diabetes mellitus   . Hypertension     There are no problems to display for this patient.   No past surgical history on file.     No family history on file.  Social History   Tobacco Use  . Smoking status: Current Every Day Smoker  . Smokeless tobacco: Never Used  Substance Use Topics  . Alcohol use: No  . Drug use: No    Home Medications Prior to Admission medications   Medication Sig Start Date End Date Taking? Authorizing Provider  amLODipine (NORVASC) 10 MG tablet Take 1 tablet (10 mg total) by mouth daily. 11/23/19 11/22/20  Mare Ferrari, PA-C  cyclobenzaprine (FLEXERIL) 10 MG tablet Take 1 tablet (10 mg total) 2 (two) times daily as needed by mouth for muscle spasms. 01/12/17   Audry Pili, PA-C  hydrochlorothiazide (HYDRODIURIL) 25 MG tablet Take 1 tablet (25 mg total) by mouth daily. 11/23/19   Mare Ferrari,  PA-C  insulin glargine (LANTUS) 100 UNIT/ML injection Inject 0.8 mLs (80 Units total) into the skin every morning. 06/23/15   Tomasita Crumble, MD  lovastatin (MEVACOR) 20 MG tablet Take 1 tablet (20 mg total) by mouth at bedtime. 06/23/15   Tomasita Crumble, MD  metFORMIN (GLUCOPHAGE) 500 MG tablet Take 1 tablet (500 mg total) by mouth at bedtime. 12/14/17 12/14/18  Loleta Rose, MD    Allergies    Penicillins  Review of Systems   Review of Systems  Constitutional: Negative for chills and fever.  Musculoskeletal: Positive for arthralgias and joint swelling.  Skin: Negative for color change and rash.  Neurological: Negative for weakness and numbness.    Physical Exam Updated Vital Signs BP (!) 133/120   Pulse 83   Temp 98.3 F (36.8 C) (Oral)   Resp 18   Ht 5\' 9"  (1.753 m)   Wt 78 kg   SpO2 100%   BMI 25.40 kg/m   Physical Exam Vitals and nursing note reviewed.  Constitutional:      General: He is not in acute distress.    Appearance: Normal appearance. He is well-developed and well-nourished. He is not ill-appearing or diaphoretic.  HENT:     Head: Normocephalic and atraumatic.  Eyes:     General:  Right eye: No discharge.        Left eye: No discharge.  Pulmonary:     Effort: Pulmonary effort is normal. No respiratory distress.  Musculoskeletal:        General: Swelling and tenderness present.     Comments: There is swelling and tenderness over the lateral malleolus.No overt deformity. No tenderness over the medial aspect of the ankle. The fifth metatarsal is not tender. The ankle joint is intact without excessive opening on stressing.  Distal pulses 2+, normal sensation and strength.  Skin:    General: Skin is warm and dry.  Neurological:     Mental Status: He is alert and oriented to person, place, and time.     Coordination: Coordination normal.  Psychiatric:        Mood and Affect: Mood and affect and mood normal.        Behavior: Behavior normal.     ED  Results / Procedures / Treatments   Labs (all labs ordered are listed, but only abnormal results are displayed) Labs Reviewed - No data to display  EKG None  Radiology DG Ankle Complete Right  Result Date: 03/02/2020 CLINICAL DATA:  Fall, injury, swelling and pain EXAM: RIGHT ANKLE - COMPLETE 3+ VIEW COMPARISON:  None. FINDINGS: Osseous alignment is normal. Ankle mortise is symmetric. No fracture line or displaced fracture fragment is seen. Visualized portions of the hindfoot and midfoot appear intact and normally aligned. Soft tissue swelling is seen over the lateral malleolus. IMPRESSION: Soft tissue swelling. No osseous fracture or dislocation. Electronically Signed   By: Bary Richard M.D.   On: 03/02/2020 09:12    Procedures Procedures (including critical care time)  Medications Ordered in ED Medications  ibuprofen (ADVIL) tablet 600 mg (600 mg Oral Given 03/02/20 1209)    ED Course  I have reviewed the triage vital signs and the nursing notes.  Pertinent labs & imaging results that were available during my care of the patient were reviewed by me and considered in my medical decision making (see chart for details).    MDM Rules/Calculators/A&P                          Presentation consistent with ankle sprain. Tenderness and swelling over lateral malleolus, pt is neurovascularly intact. X-ray reviewed by myself, agree with radiologist findings, negative for fracture, and shows ankle mortise is intact. Pain treated in the ED. Pt placed in ASO brace and provided crutches, ambulated without difficulty. Pt stable for discharge home with NSAIDs & RICE therapy. Pt to follow-up with ortho in one week if symptoms not improving. Return precautions discussed, Pt expresses understanding and agrees with plan.  Final Clinical Impression(s) / ED Diagnoses Final diagnoses:  Sprain of anterior talofibular ligament of right ankle, initial encounter    Rx / DC Orders ED Discharge Orders     None       Legrand Rams 03/02/20 1312    Arby Barrette, MD 03/03/20 1524

## 2020-03-02 NOTE — Discharge Instructions (Addendum)
You have an ankle sprain, your x-ray today shows no evidence of fracture. You can use ibuprofen and Tylenol for pain, ice and elevate the ankle, use ankle brace and crutches and try and stay off of it as much as possible. You may bear weight as tolerated as symptoms improve. If ankle is not improving after 1 week please call to schedule follow-up appointment with orthopedics.  Your blood pressure was elevated today, please make sure you are taking your blood pressure medications daily. Please follow up with your primary doctor in 1 week for blood pressure recheck.

## 2020-05-01 ENCOUNTER — Encounter (HOSPITAL_COMMUNITY): Payer: Self-pay | Admitting: Emergency Medicine

## 2020-05-01 ENCOUNTER — Ambulatory Visit (HOSPITAL_COMMUNITY)
Admission: EM | Admit: 2020-05-01 | Discharge: 2020-05-01 | Disposition: A | Discharge status: Home / Self Care | Payer: Self-pay | Attending: Internal Medicine | Admitting: Internal Medicine

## 2020-05-01 ENCOUNTER — Other Ambulatory Visit: Payer: Self-pay

## 2020-05-01 DIAGNOSIS — I160 Hypertensive urgency: Secondary | ICD-10-CM

## 2020-05-01 DIAGNOSIS — S43401A Unspecified sprain of right shoulder joint, initial encounter: Secondary | ICD-10-CM

## 2020-05-01 MED ORDER — TRIAMTERENE-HCTZ 37.5-25 MG PO TABS: 1.0000 | ORAL_TABLET | Freq: Every day | ORAL | 2 refills | Status: DC

## 2020-05-01 MED ORDER — KETOROLAC TROMETHAMINE 30 MG/ML IJ SOLN
30.0000 mg | Freq: Once | INTRAMUSCULAR | Status: DC
  Administered 2020-05-01: 30 mg via INTRAVENOUS

## 2020-05-01 MED ORDER — CYCLOBENZAPRINE HCL 5 MG PO TABS: 5.0000 mg | ORAL_TABLET | Freq: Every evening | ORAL | 0 refills | Status: DC | PRN

## 2020-05-01 MED ORDER — CLONIDINE HCL 0.1 MG PO TABS
0.1000 mg | ORAL_TABLET | Freq: Once | ORAL | Status: AC
  Administered 2020-05-01: 0.1 mg via ORAL

## 2020-05-01 MED ORDER — KETOROLAC TROMETHAMINE 30 MG/ML IJ SOLN
INTRAMUSCULAR | Status: AC
  Filled 2020-05-01: qty 1

## 2020-05-01 MED ORDER — TRAMADOL HCL 50 MG PO TABS: 50.0000 mg | ORAL_TABLET | Freq: Two times a day (BID) | ORAL | 0 refills | Status: DC | PRN

## 2020-05-01 MED ORDER — IBUPROFEN 600 MG PO TABS: 600.0000 mg | ORAL_TABLET | Freq: Four times a day (QID) | ORAL | 0 refills | Status: DC | PRN

## 2020-05-01 MED ORDER — CLONIDINE HCL 0.1 MG PO TABS
ORAL_TABLET | ORAL | Status: AC
  Filled 2020-05-01: qty 1

## 2020-05-01 MED ORDER — KETOROLAC TROMETHAMINE 30 MG/ML IJ SOLN
30.0000 mg | Freq: Once | INTRAMUSCULAR | Status: AC
  Administered 2020-05-01: 30 mg via INTRAMUSCULAR

## 2020-05-01 NOTE — ED Provider Notes (Signed)
[facility]    CSN: [id number] Arrival date & time: 05/01/20  1658      History   Chief Complaint Chief Complaint  Patient presents with  . Arm Injury    HPI Jose Morrow is a 55 y.o. male comes to urgent care with complaints of right shoulder pain of 4 days duration.  Patient believes he injured his right shoulder was trying to scrape some dried glue from the floor at work.  Patient describes the pain as severe, currently 10 out of 10, sharp, aggravated by movement.  No known relieving factors.  He has tried ibuprofen with partial relief.  He also has right-sided neck pain and some numbness and tingling in the right hand.  No weakness in the right hand.  Patient denies headaches, chest pain or chest pressure or abdominal pain.  Patient has a history of hypertension currently on hydrochlorothiazide and amlodipine.  His blood pressure has been uncontrolled for the past several months.  He is compliant with his medications.  Blood pressure is usually about 200 mmHg. [name]   HPI  Past Medical History:  Diagnosis Date  . Diabetes mellitus   . Hypertension     There are no problems to display for this patient.   History reviewed. No pertinent surgical history.     Home Medications    Prior to Admission medications   Medication Sig Start Date End Date Taking? Authorizing Provider  amLODipine (NORVASC) 10 MG tablet Take 1 tablet (10 mg total) by mouth daily. 11/23/19 11/22/20 Yes Mare Ferrari, PA-C  cyclobenzaprine (FLEXERIL) 5 MG tablet Take 1 tablet (5 mg total) by mouth at bedtime as needed for muscle spasms. 05/01/20  Yes Adjoa Althouse, Britta Mccreedy, MD  ibuprofen (ADVIL) 600 MG tablet Take 1 tablet (600 mg total) by mouth every 6 (six) hours as needed. 05/01/20  Yes Pranav Lince, Britta Mccreedy, MD  traMADol (ULTRAM) 50 MG tablet Take 1 tablet (50 mg total) by mouth every 12 (twelve) hours as needed for moderate pain or severe pain. 05/01/20  Yes Kavion Mancinas, Britta Mccreedy, MD   triamterene-hydrochlorothiazide (MAXZIDE-25) 37.5-25 MG tablet Take 1 tablet by mouth daily. 05/01/20  Yes Jaydence Arnesen, Britta Mccreedy, MD  hydrochlorothiazide (HYDRODIURIL) 25 MG tablet Take 1 tablet (25 mg total) by mouth daily. 11/23/19 05/01/20 Yes Mare Ferrari, PA-C  insulin glargine (LANTUS) 100 UNIT/ML injection Inject 0.8 mLs (80 Units total) into the skin every morning. 06/23/15   Tomasita Crumble, MD  lovastatin (MEVACOR) 20 MG tablet Take 1 tablet (20 mg total) by mouth at bedtime. 06/23/15   Tomasita Crumble, MD  metFORMIN (GLUCOPHAGE) 500 MG tablet Take 1 tablet (500 mg total) by mouth at bedtime. 12/14/17 12/14/18  Loleta Rose, MD    Family History History reviewed. No pertinent family history.  Social History Social History   Tobacco Use  . Smoking status: Current Every Day Smoker  . Smokeless tobacco: Never Used  Substance Use Topics  . Alcohol use: No  . Drug use: No     Allergies   Penicillins   Review of Systems Review of Systems  HENT: Negative.   Respiratory: Negative for cough, chest tightness and shortness of breath.   Cardiovascular: Negative for chest pain.  Gastrointestinal: Negative for abdominal pain, diarrhea, nausea and vomiting.  Musculoskeletal: Positive for arthralgias, myalgias and neck pain. Negative for neck stiffness.  Neurological: Negative for dizziness and headaches.     Physical Exam Triage Vital Signs ED Triage Vitals  Enc Vitals Group  BP 05/01/20 1729 (!) 202/110     Pulse Rate 05/01/20 1729 81     Resp 05/01/20 1729 17     Temp 05/01/20 1729 98.3 F (36.8 C)     Temp src --      SpO2 05/01/20 1729 100 %     Weight --      Height --      Head Circumference --      Peak Flow --      Pain Score 05/01/20 1727 10     Pain Loc --      Pain Edu? --      Excl. in GC? --    No data found.  Updated Vital Signs BP (!) 195/93 (BP Location: Left Arm)   Pulse 74   Temp 98.3 F (36.8 C)   Resp 17   SpO2 100%   Visual Acuity Right Eye  Distance:   Left Eye Distance:   Bilateral Distance:    Right Eye Near:   Left Eye Near:    Bilateral Near:     Physical Exam Vitals and nursing note reviewed.  Constitutional:      General: He is in acute distress.     Appearance: He is not ill-appearing.  Cardiovascular:     Rate and Rhythm: Normal rate and regular rhythm.     Pulses: Normal pulses.     Heart sounds: Normal heart sounds.  Pulmonary:     Effort: Pulmonary effort is normal.     Breath sounds: Normal breath sounds.  Abdominal:     General: Bowel sounds are normal.     Palpations: Abdomen is soft.  Musculoskeletal:        General: Tenderness present. Normal range of motion.     Comments: Pain with range of motion of the right shoulder.  Right latissimus dorsi tenderness and increased muscle tone.  No bruising noted.  Neurological:     Mental Status: He is alert.      UC Treatments / Results  Labs (all labs ordered are listed, but only abnormal results are displayed) Labs Reviewed - No data to display  EKG   Radiology No results found.  Procedures Procedures (including critical care time)  Medications Ordered in UC Medications  cloNIDine (CATAPRES) tablet 0.1 mg (0.1 mg Oral Given 05/01/20 1816)  ketorolac (TORADOL) 30 MG/ML injection 30 mg (30 mg Intramuscular Given 05/01/20 1821)    Initial Impression / Assessment and Plan / UC Course  I have reviewed the triage vital signs and the nursing notes.  Pertinent labs & imaging results that were available during my care of the patient were reviewed by me and considered in my medical decision making (see chart for details).     1.  Right shoulder sprain: Toradol 30 mg IM x1 dose Tramadol 50 mg every 12 hours as needed for pain Flexeril as needed for muscle stiffness Gentle range of motion exercises Continue ibuprofen 600 mg every 6 hours as needed for pain  2.  Hypertensive urgency: Patient denies any headaches, chest pain or abdominal  pain Clonidine 0.1 mg x 1 dose Repeat blood pressure 20 minutes after clonidine dose decreased to 209/99. Discontinue hydrochlorothiazide Start patient on triamterene-hydrochlorothiazide Patient is encouraged to follow-up with primary care physician for further titration of his blood pressure medications. Return to ED precautions given. Final Clinical Impressions(s) / UC Diagnoses   Final diagnoses:  Hypertensive urgency  Sprain of right shoulder, unspecified shoulder sprain type, initial encounter  Discharge Instructions     Please take medications as directed If you develop any headaches, severe chest pain, severe abdominal pain please go to the emergency department to be evaluated Gentle range of motion exercises from your shoulder and neck If pain worsens or if you develop any weakness in your right upper extremity please return to urgent care to be reevaluated.   ED Prescriptions    Medication Sig Dispense Auth. Provider   triamterene-hydrochlorothiazide (MAXZIDE-25) 37.5-25 MG tablet Take 1 tablet by mouth daily. 30 tablet Fatim Vanderschaaf, Britta Mccreedy, MD   cyclobenzaprine (FLEXERIL) 5 MG tablet Take 1 tablet (5 mg total) by mouth at bedtime as needed for muscle spasms. 15 tablet Zeek Rostron, Britta Mccreedy, MD   traMADol (ULTRAM) 50 MG tablet Take 1 tablet (50 mg total) by mouth every 12 (twelve) hours as needed for moderate pain or severe pain. 12 tablet Dallas Scorsone, Britta Mccreedy, MD   ibuprofen (ADVIL) 600 MG tablet Take 1 tablet (600 mg total) by mouth every 6 (six) hours as needed. 30 tablet Nolon Yellin, Britta Mccreedy, MD     I have reviewed the PDMP during this encounter.   Merrilee Jansky, MD 05/01/20 [phone]

## 2020-05-01 NOTE — ED Notes (Signed)
Patient says he did take his blood pressure medicine today

## 2020-05-01 NOTE — ED Triage Notes (Signed)
Patient c/o RT arm pain x 2 days.   Patient endorses that onset of symptoms began at work when "going up under the machine to scrape some stuff off the floor, when my arm started hurting".   Patient endorses "the pain is so bad I can't sleep at night".   Patient endorses RT sided neck pain.   Patient endorses pain worsens with activity.   Patient took Ibuprofen with minimal relief of symptoms.

## 2020-05-01 NOTE — Discharge Instructions (Addendum)
Please take medications as directed If you develop any headaches, severe chest pain, severe abdominal pain please go to the emergency department to be evaluated Gentle range of motion exercises from your shoulder and neck If pain worsens or if you develop any weakness in your right upper extremity please return to urgent care to be reevaluated.

## 2020-05-08 ENCOUNTER — Encounter (HOSPITAL_COMMUNITY): Payer: Self-pay

## 2020-05-08 ENCOUNTER — Other Ambulatory Visit: Payer: Self-pay

## 2020-05-08 ENCOUNTER — Ambulatory Visit (HOSPITAL_COMMUNITY)
Admission: EM | Admit: 2020-05-08 | Discharge: 2020-05-08 | Disposition: A | Discharge status: Home / Self Care | Payer: Self-pay | Attending: Emergency Medicine | Admitting: Emergency Medicine

## 2020-05-08 DIAGNOSIS — I160 Hypertensive urgency: Secondary | ICD-10-CM

## 2020-05-08 DIAGNOSIS — M25511 Pain in right shoulder: Secondary | ICD-10-CM

## 2020-05-08 LAB — BASIC METABOLIC PANEL
Anion gap: 9 (ref 5–15)
BUN: 13 mg/dL (ref 6–20)
CO2: 30 mmol/L (ref 22–32)
Calcium: 9.8 mg/dL (ref 8.9–10.3)
Chloride: 97 mmol/L — ABNORMAL LOW (ref 98–111)
Creatinine, Ser: 1.22 mg/dL (ref 0.61–1.24)
GFR, Estimated: >60 mL/min (ref >60)
Glucose, Bld: 286 mg/dL — ABNORMAL HIGH (ref 70–99)
Potassium: 4.2 mmol/L (ref 3.5–5.1)
Sodium: 136 mmol/L (ref 135–145)

## 2020-05-08 MED ORDER — CLONIDINE HCL 0.1 MG PO TABS
ORAL_TABLET | ORAL | Status: AC
  Filled 2020-05-08: qty 1

## 2020-05-08 MED ORDER — CLONIDINE HCL 0.1 MG PO TABS
0.1000 mg | ORAL_TABLET | Freq: Once | ORAL | Status: AC
  Administered 2020-05-08: 0.1 mg via ORAL

## 2020-05-08 NOTE — Discharge Instructions (Signed)
Continue taking the ibuprofen and Flexeril as previously prescribed.  Follow-up with an orthopedist such as the one listed below for your shoulder pain.    Establish a primary care provider as soon as possible for your ongoing very elevated blood pressure.  Go to the emergency department if you have acute concerning symptoms, such as chest pain, shortness of breath, weakness.

## 2020-05-08 NOTE — ED Provider Notes (Signed)
[facility]    CSN: [id number] Arrival date & time: 05/08/20  0947      History   Chief Complaint Chief Complaint  Patient presents with  . Shoulder Pain  . Neck Pain  . Back Pain    HPI Jose Morrow is a 55 y.o. male.   Patient presents with ongoing right shoulder muscle pain which radiates to his right side neck and right upper arm.  He states the pain started after scraping dried glue from the floor at work.  He reports intermittent numbness in his right thumb.  He denies weakness, tingling, wounds, redness, bruising, or other symptoms.  Patient states he does not have a PCP; he states his blood pressure and other medications have been prescribed here or at the hospital.  He has taken one of his blood pressure medications today (plain HCTZ) and will take the other one when he gets home.  He denies chest pain, shortness of breath, focal weakness, or other symptoms.  Patient was seen here on 05/01/2020; diagnosed with right shoulder sprain and hypertensive urgency; right shoulder sprain treated with Toradol, tramadol, Flexeril, ibuprofen; hypertensive urgency treated with clonidine and patient started on triamterene-hydrochlorothiazide with instructions to follow-up with his PCP.  He reports he has not started taking the triamterene-HCTZ but has still been taking the plain HCTZ he was previously prescribed.  Patient's medical history includes diabetes and hypertension.  Current every day smoker.  The history is provided by the patient and medical records.    Past Medical History:  Diagnosis Date  . Diabetes mellitus   . Hypertension     There are no problems to display for this patient.   History reviewed. No pertinent surgical history.     Home Medications    Prior to Admission medications   Medication Sig Start Date End Date Taking? Authorizing Provider  amLODipine (NORVASC) 10 MG tablet Take 1 tablet (10 mg total) by mouth daily. 11/23/19 11/22/20  Mare Ferrari,  PA-C  cyclobenzaprine (FLEXERIL) 5 MG tablet Take 1 tablet (5 mg total) by mouth at bedtime as needed for muscle spasms. 05/01/20   Merrilee Jansky, MD  ibuprofen (ADVIL) 600 MG tablet Take 1 tablet (600 mg total) by mouth every 6 (six) hours as needed. 05/01/20   Merrilee Jansky, MD  insulin glargine (LANTUS) 100 UNIT/ML injection Inject 0.8 mLs (80 Units total) into the skin every morning. 06/23/15   Tomasita Crumble, MD  lovastatin (MEVACOR) 20 MG tablet Take 1 tablet (20 mg total) by mouth at bedtime. 06/23/15   Tomasita Crumble, MD  metFORMIN (GLUCOPHAGE) 500 MG tablet Take 1 tablet (500 mg total) by mouth at bedtime. 12/14/17 12/14/18  Loleta Rose, MD  traMADol (ULTRAM) 50 MG tablet Take 1 tablet (50 mg total) by mouth every 12 (twelve) hours as needed for moderate pain or severe pain. 05/01/20   Merrilee Jansky, MD  triamterene-hydrochlorothiazide (MAXZIDE-25) 37.5-25 MG tablet Take 1 tablet by mouth daily. 05/01/20   Lamptey, Britta Mccreedy, MD  hydrochlorothiazide (HYDRODIURIL) 25 MG tablet Take 1 tablet (25 mg total) by mouth daily. 11/23/19 05/01/20  Mare Ferrari, PA-C    Family History History reviewed. No pertinent family history.  Social History Social History   Tobacco Use  . Smoking status: Current Every Day Smoker  . Smokeless tobacco: Never Used  Substance Use Topics  . Alcohol use: No  . Drug use: No     Allergies   Penicillins   Review  of Systems Review of Systems  Constitutional: Negative for chills and fever.  HENT: Negative for ear pain and sore throat.   Eyes: Negative for pain and visual disturbance.  Respiratory: Negative for cough and shortness of breath.   Cardiovascular: Negative for chest pain and palpitations.  Gastrointestinal: Negative for abdominal pain and vomiting.  Genitourinary: Negative for dysuria and hematuria.  Musculoskeletal: Positive for arthralgias and myalgias. Negative for back pain.  Skin: Negative for color change and rash.  Neurological:  Positive for numbness. Negative for dizziness, tremors, seizures, syncope, facial asymmetry, speech difficulty, weakness, light-headedness and headaches.  All other systems reviewed and are negative.    Physical Exam Triage Vital Signs ED Triage Vitals  Enc Vitals Group     BP      Pulse      Resp      Temp      Temp src      SpO2      Weight      Height      Head Circumference      Peak Flow      Pain Score      Pain Loc      Pain Edu?      Excl. in GC?    No data found.  Updated Vital Signs BP (!) 198/126 (BP Location: Right Arm)   Pulse 70   Temp 98.3 F (36.8 C) (Oral)   Resp 17   SpO2 96%   Visual Acuity Right Eye Distance:   Left Eye Distance:   Bilateral Distance:    Right Eye Near:   Left Eye Near:    Bilateral Near:     Physical Exam Vitals and nursing note reviewed.  Constitutional:      General: He is not in acute distress.    Appearance: He is well-developed and well-nourished. He is not ill-appearing.  HENT:     Head: Normocephalic and atraumatic.     Mouth/Throat:     Mouth: Mucous membranes are moist.  Eyes:     Conjunctiva/sclera: Conjunctivae normal.  Cardiovascular:     Rate and Rhythm: Normal rate and regular rhythm.     Heart sounds: Normal heart sounds.  Pulmonary:     Effort: Pulmonary effort is normal. No respiratory distress.     Breath sounds: Normal breath sounds.  Abdominal:     Palpations: Abdomen is soft.     Tenderness: There is no abdominal tenderness.  Musculoskeletal:        General: Tenderness present. No swelling, deformity or edema. Normal range of motion.     Cervical back: Neck supple.     Comments: Right trapezius muscle tenderness to palpation.  Skin:    General: Skin is warm and dry.     Findings: No bruising, erythema, lesion or rash.  Neurological:     General: No focal deficit present.     Mental Status: He is alert and oriented to person, place, and time.     Sensory: No sensory deficit.     Motor:  No weakness.     Gait: Gait normal.  Psychiatric:        Mood and Affect: Mood and affect and mood normal.        Behavior: Behavior normal.      UC Treatments / Results  Labs (all labs ordered are listed, but only abnormal results are displayed) Labs Reviewed  BASIC METABOLIC PANEL - Abnormal; Notable for the following components:  Result Value   Chloride 97 (*)    Glucose, Bld 286 (*)    All other components within normal limits    EKG   Radiology No results found.  Procedures Procedures (including critical care time)  Medications Ordered in UC Medications  cloNIDine (CATAPRES) tablet 0.1 mg (0.1 mg Oral Given 05/08/20 1016)    Initial Impression / Assessment and Plan / UC Course  I have reviewed the triage vital signs and the nursing notes.  Pertinent labs & imaging results that were available during my care of the patient were reviewed by me and considered in my medical decision making (see chart for details).   Hypertensive urgency.  Acute right shoulder pain.  Patient took his dose of plain HCTZ this morning but has not taken his amlodipine.  He has not started the new prescription for triamterene-HCTZ.  Clonidine given here and patient instructed to take his amlodipine as soon as he gets home this morning.  Discussed with patient at length the risks of his blood pressure being so elevated.  Instructed him to start the new prescription of triamterene-HCTZ tomorrow morning and to continue taking the amlodipine as prescribed.  Discussed that he must establish a PCP as soon as possible; assistance requested from [facility].  Strict ED precautions discussed.  Instructed patient to continue taking ibuprofen and Flexeril as previously prescribed for his shoulder pain.  Instructed him to follow-up with orthopedics if his symptoms are not improving.  He agrees to plan of care.   Final Clinical Impressions(s) / UC Diagnoses   Final diagnoses:  Hypertensive urgency  Acute  pain of right shoulder     Discharge Instructions     Continue taking the ibuprofen and Flexeril as previously prescribed.  Follow-up with an orthopedist such as the one listed below for your shoulder pain.    Establish a primary care provider as soon as possible for your ongoing very elevated blood pressure.  Go to the emergency department if you have acute concerning symptoms, such as chest pain, shortness of breath, weakness.          ED Prescriptions    None     I have reviewed the PDMP during this encounter.   Mickie Bail, NP 05/08/20 1253

## 2020-05-08 NOTE — ED Triage Notes (Signed)
Pt presents with right shoulder pain that radiates to the back of the neck and back. Pt states the pain medication he was prescribed did not give him relief. Pt states he has not been able to sleep. Pt states when he sits down and lays down the pain is worse. Pt states his thumb has become numb.

## 2020-05-10 ENCOUNTER — Emergency Department (HOSPITAL_COMMUNITY): Payer: Self-pay

## 2020-05-10 ENCOUNTER — Emergency Department (HOSPITAL_COMMUNITY)
Admission: EM | Admit: 2020-05-10 | Discharge: 2020-05-10 | Disposition: A | Discharge status: Home / Self Care | Payer: Self-pay | Attending: Emergency Medicine | Admitting: Emergency Medicine

## 2020-05-10 ENCOUNTER — Other Ambulatory Visit: Payer: Self-pay

## 2020-05-10 DIAGNOSIS — Z79899 Other long term (current) drug therapy: Secondary | ICD-10-CM | POA: Insufficient documentation

## 2020-05-10 DIAGNOSIS — Z794 Long term (current) use of insulin: Secondary | ICD-10-CM | POA: Insufficient documentation

## 2020-05-10 DIAGNOSIS — X501XXA Overexertion from prolonged static or awkward postures, initial encounter: Secondary | ICD-10-CM | POA: Insufficient documentation

## 2020-05-10 DIAGNOSIS — E119 Type 2 diabetes mellitus without complications: Secondary | ICD-10-CM | POA: Insufficient documentation

## 2020-05-10 DIAGNOSIS — M25511 Pain in right shoulder: Secondary | ICD-10-CM | POA: Insufficient documentation

## 2020-05-10 DIAGNOSIS — I10 Essential (primary) hypertension: Secondary | ICD-10-CM | POA: Insufficient documentation

## 2020-05-10 DIAGNOSIS — F17200 Nicotine dependence, unspecified, uncomplicated: Secondary | ICD-10-CM | POA: Insufficient documentation

## 2020-05-10 MED ORDER — HYDROCODONE-ACETAMINOPHEN 5-325 MG PO TABS
2.0000 | ORAL_TABLET | Freq: Once | ORAL | Status: AC
  Administered 2020-05-10: 2 via ORAL
  Filled 2020-05-10: qty 2

## 2020-05-10 MED ORDER — KETOROLAC TROMETHAMINE 30 MG/ML IJ SOLN
15.0000 mg | Freq: Once | INTRAMUSCULAR | Status: AC
  Administered 2020-05-10: 15 mg via INTRAMUSCULAR
  Filled 2020-05-10: qty 1

## 2020-05-10 NOTE — Discharge Instructions (Signed)
You need to be seen by an orthopedic specialist.  You may need injections or surgery, or physical therapy to help control your symptoms.  Please take your arm out of the sling at least once per day and do some range of motion exercises.

## 2020-05-10 NOTE — ED Triage Notes (Signed)
Pt c/o right shoulder pain when it was injured one week ago. Was seen at Urgent Care also today for same.

## 2020-05-10 NOTE — ED Provider Notes (Signed)
[facility] EMERGENCY DEPARTMENT Provider Note   CSN: [id number] Arrival date & time: 05/10/20  0007     History Chief Complaint  Patient presents with  . Shoulder Injury    Jose Morrow is a 55 y.o. male.  Patient presents to the emergency department with chief complaint of right shoulder pain.  He states that he was taking up some floors about a week ago.  He states that he jarred his right arm and shoulder.  He states that he did not think anything of it until later that night he noticed that it was sore.  He thought that he could rest and that it would go away on its own.  He states that it is now been over a week of persistent pain.  The pain keeps him from sleeping.  He has been seen at urgent care and prescribed a muscle relaxer and an anti-inflammatory.  He states that the pain awoke him from sleep tonight and he came straight to the emergency department seeking pain control.  He denies fever or redness.  States that he has continued to work every day.  He reports some numbness sensation in his right thumb.  The history is provided by the patient. No language interpreter was used.       Past Medical History:  Diagnosis Date  . Diabetes mellitus   . Hypertension     There are no problems to display for this patient.   No past surgical history on file.     No family history on file.  Social History   Tobacco Use  . Smoking status: Current Every Day Smoker  . Smokeless tobacco: Never Used  Substance Use Topics  . Alcohol use: No  . Drug use: No    Home Medications Prior to Admission medications   Medication Sig Start Date End Date Taking? Authorizing Provider  amLODipine (NORVASC) 10 MG tablet Take 1 tablet (10 mg total) by mouth daily. 11/23/19 11/22/20  Mare Ferrari, PA-C  cyclobenzaprine (FLEXERIL) 5 MG tablet Take 1 tablet (5 mg total) by mouth at bedtime as needed for muscle spasms. 05/01/20   Merrilee Jansky, MD  ibuprofen (ADVIL) 600  MG tablet Take 1 tablet (600 mg total) by mouth every 6 (six) hours as needed. 05/01/20   Merrilee Jansky, MD  insulin glargine (LANTUS) 100 UNIT/ML injection Inject 0.8 mLs (80 Units total) into the skin every morning. 06/23/15   Tomasita Crumble, MD  lovastatin (MEVACOR) 20 MG tablet Take 1 tablet (20 mg total) by mouth at bedtime. 06/23/15   Tomasita Crumble, MD  metFORMIN (GLUCOPHAGE) 500 MG tablet Take 1 tablet (500 mg total) by mouth at bedtime. 12/14/17 12/14/18  Loleta Rose, MD  traMADol (ULTRAM) 50 MG tablet Take 1 tablet (50 mg total) by mouth every 12 (twelve) hours as needed for moderate pain or severe pain. 05/01/20   Merrilee Jansky, MD  triamterene-hydrochlorothiazide (MAXZIDE-25) 37.5-25 MG tablet Take 1 tablet by mouth daily. 05/01/20   Lamptey, Britta Mccreedy, MD  hydrochlorothiazide (HYDRODIURIL) 25 MG tablet Take 1 tablet (25 mg total) by mouth daily. 11/23/19 05/01/20  Mare Ferrari, PA-C    Allergies    Penicillins  Review of Systems   Review of Systems  All other systems reviewed and are negative.   Physical Exam Updated Vital Signs BP (!) 186/107 (BP Location: Right Arm)   Pulse 75   Temp 97.9 F (36.6 C) (Oral)   Resp 16  Ht 5\' 9"  (1.753 m)   Wt 83.5 kg   SpO2 100%   BMI 27.17 kg/m   Physical Exam Vitals and nursing note reviewed.  Constitutional:      Appearance: He is well-developed.  HENT:     Head: Normocephalic and atraumatic.  Eyes:     Conjunctiva/sclera: Conjunctivae normal.  Cardiovascular:     Rate and Rhythm: Normal rate and regular rhythm.     Heart sounds: No murmur heard.   Pulmonary:     Effort: Pulmonary effort is normal. No respiratory distress.     Breath sounds: Normal breath sounds.  Abdominal:     Palpations: Abdomen is soft.     Tenderness: There is no abdominal tenderness.  Musculoskeletal:        General: Normal range of motion.     Cervical back: Neck supple.     Comments: Limited range of motion above 90 degrees abduction, no bony  abnormality or deformity, no redness, strength limited by pain  Skin:    General: Skin is warm and dry.  Neurological:     Mental Status: He is alert and oriented to person, place, and time.  Psychiatric:        Mood and Affect: Mood normal.        Behavior: Behavior normal.     ED Results / Procedures / Treatments   Labs (all labs ordered are listed, but only abnormal results are displayed) Labs Reviewed - No data to display  EKG None  Radiology DG Shoulder Right  Result Date: 05/10/2020 CLINICAL DATA:  Shoulder injury 1 week prior EXAM: RIGHT SHOULDER - 2+ VIEW COMPARISON:  None. 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 No acute bony abnormality. Specifically, no fracture, subluxation, or dislocation. Soft tissues are unremarkable. No acute abnormality of the included right chest wall. 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 No acute osseous abnormality. Electronically Signed   By: 07/10/2020 M.D.   On: 05/10/2020 00:52    Procedures Procedures   Medications Ordered in ED Medications  ketorolac (TORADOL) 30 MG/ML injection 15 mg (has no administration in time range)  HYDROcodone-acetaminophen (NORCO/VICODIN) 5-325 MG per tablet 2 tablet (has no administration in time range)    ED Course  I have reviewed the triage vital signs and the nursing notes.  Pertinent labs & imaging results that were available during my care of the patient were reviewed by me and considered in my medical decision making (see chart for details).    MDM Rules/Calculators/A&P                          Patient presents with injury to right shoulder.  DDx includes, fracture, strain, or sprain.  Consultants: None  Plain films reveal moderate osteoarthrosis.  Pt advised to follow up with PCP and/or orthopedics. Patient given sling while in ED, conservative therapy such as RICE recommended and discussed.    Patient will be discharged home & is agreeable with above plan. Returns precautions discussed. Pt appears safe for discharge.  Final Clinical Impression(s) / ED Diagnoses Final diagnoses:  Acute pain of right shoulder    Rx / DC Orders ED Discharge Orders    None       07/10/2020, PA-C 05/10/20 0415    07/10/20, MD 05/10/20 [phone]

## 2020-06-25 ENCOUNTER — Other Ambulatory Visit: Payer: Self-pay

## 2020-06-25 ENCOUNTER — Encounter (HOSPITAL_COMMUNITY): Payer: Self-pay

## 2020-06-25 ENCOUNTER — Emergency Department (HOSPITAL_COMMUNITY)
Admission: EM | Admit: 2020-06-25 | Discharge: 2020-06-25 | Disposition: A | Discharge status: Home / Self Care | Payer: Self-pay | Attending: Emergency Medicine | Admitting: Emergency Medicine

## 2020-06-25 DIAGNOSIS — Z79899 Other long term (current) drug therapy: Secondary | ICD-10-CM | POA: Insufficient documentation

## 2020-06-25 DIAGNOSIS — Z7984 Long term (current) use of oral hypoglycemic drugs: Secondary | ICD-10-CM | POA: Insufficient documentation

## 2020-06-25 DIAGNOSIS — M25511 Pain in right shoulder: Secondary | ICD-10-CM | POA: Insufficient documentation

## 2020-06-25 DIAGNOSIS — R1013 Epigastric pain: Secondary | ICD-10-CM | POA: Insufficient documentation

## 2020-06-25 DIAGNOSIS — Z794 Long term (current) use of insulin: Secondary | ICD-10-CM | POA: Insufficient documentation

## 2020-06-25 DIAGNOSIS — F17200 Nicotine dependence, unspecified, uncomplicated: Secondary | ICD-10-CM | POA: Insufficient documentation

## 2020-06-25 DIAGNOSIS — I10 Essential (primary) hypertension: Secondary | ICD-10-CM | POA: Insufficient documentation

## 2020-06-25 DIAGNOSIS — R195 Other fecal abnormalities: Secondary | ICD-10-CM | POA: Insufficient documentation

## 2020-06-25 DIAGNOSIS — E119 Type 2 diabetes mellitus without complications: Secondary | ICD-10-CM | POA: Insufficient documentation

## 2020-06-25 LAB — COMPREHENSIVE METABOLIC PANEL
ALT: 9 U/L (ref 0–44)
AST: 9 U/L — ABNORMAL LOW (ref 15–41)
Albumin: 3.1 g/dL — ABNORMAL LOW (ref 3.5–5.0)
Alkaline Phosphatase: 47 U/L (ref 38–126)
Anion gap: 8 (ref 5–15)
BUN: 33 mg/dL — ABNORMAL HIGH (ref 6–20)
CO2: 29 mmol/L (ref 22–32)
Calcium: 9.1 mg/dL (ref 8.9–10.3)
Chloride: 96 mmol/L — ABNORMAL LOW (ref 98–111)
Creatinine, Ser: 1.33 mg/dL — ABNORMAL HIGH (ref 0.61–1.24)
GFR, Estimated: >60 mL/min (ref >60)
Glucose, Bld: 349 mg/dL — ABNORMAL HIGH (ref 70–99)
Potassium: 4.2 mmol/L (ref 3.5–5.1)
Sodium: 133 mmol/L — ABNORMAL LOW (ref 135–145)
Total Bilirubin: 0.5 mg/dL (ref 0.3–1.2)
Total Protein: 6.2 g/dL — ABNORMAL LOW (ref 6.5–8.1)

## 2020-06-25 LAB — CBC
HCT: 37 % — ABNORMAL LOW (ref 39.0–52.0)
Hemoglobin: 12.5 g/dL — ABNORMAL LOW (ref 13.0–17.0)
MCH: 27.6 pg (ref 26.0–34.0)
MCHC: 33.8 g/dL (ref 30.0–36.0)
MCV: 81.7 fL (ref 80.0–100.0)
Platelets: 315 10*3/uL (ref 150–400)
RBC: 4.53 MIL/uL (ref 4.22–5.81)
RDW: 12.6 % (ref 11.5–15.5)
WBC: 7.8 10*3/uL (ref 4.0–10.5)
nRBC: 0 % (ref 0.0–0.2)

## 2020-06-25 LAB — TYPE AND SCREEN
ABO/RH(D): B POS
Antibody Screen: NEGATIVE

## 2020-06-25 LAB — HEMOGLOBIN AND HEMATOCRIT, BLOOD
HCT: 33.6 % — ABNORMAL LOW (ref 39.0–52.0)
Hemoglobin: 11.5 g/dL — ABNORMAL LOW (ref 13.0–17.0)

## 2020-06-25 MED ORDER — PANTOPRAZOLE SODIUM 20 MG PO TBEC: 20.0000 mg | DELAYED_RELEASE_TABLET | Freq: Two times a day (BID) | ORAL | 0 refills | Status: DC

## 2020-06-25 MED ORDER — PREDNISONE 10 MG PO TABS: 20.0000 mg | ORAL_TABLET | Freq: Two times a day (BID) | ORAL | 0 refills | Status: DC

## 2020-06-25 NOTE — ED Triage Notes (Signed)
Black tarry stools x1 episode tonight and abdominal pain. Pt also c/o right shoulder pain which he has been seen here for.

## 2020-06-25 NOTE — ED Notes (Signed)
Ambulated to restroom with steady gait at this time 

## 2020-06-25 NOTE — Discharge Instructions (Signed)
Begin taking Protonix and prednisone as prescribed.  Follow-up with [facility] gastroenterology in the next few days.  Their contact information has been provided in this discharge summary for you to call and make these arrangements.  Return to the emergency department if you develop worsening bleeding, severe abdominal pain, high fever, dizziness or lightheadedness, or other new and concerning symptoms.

## 2020-06-25 NOTE — ED Notes (Signed)
Provider at bedside at this time

## 2020-06-25 NOTE — ED Notes (Signed)
Contacted lab reference results at this time per providers request

## 2020-06-25 NOTE — ED Notes (Signed)
Lab at bedside at this time.  

## 2020-06-25 NOTE — ED Provider Notes (Signed)
[facility] EMERGENCY DEPARTMENT Provider Note   CSN: [id number] Arrival date & time: 06/25/20  0036     History Chief Complaint  Patient presents with  . Melena  . Shoulder Pain    WEILAND TOMICH is a 55 y.o. male.  Patient is a 55 year old male with history of diabetes and hypertension.  He presents today with complaints of right shoulder pain and dark stools.  Patient has been treated recently for an injury to his right shoulder that occurred at work.  He was treated with NSAIDs.  Over the past 2 days, he has had some epigastric discomfort, then noticed dark stools this evening.  He denies any fevers or chills.  He denies feeling lightheaded or dizzy.  Patient has not had a colonoscopy or endoscopy in the past.  The history is provided by the patient.       Past Medical History:  Diagnosis Date  . Diabetes mellitus   . Hypertension     There are no problems to display for this patient.   History reviewed. No pertinent surgical history.     History reviewed. No pertinent family history.  Social History   Tobacco Use  . Smoking status: Current Every Day Smoker  . Smokeless tobacco: Never Used  Substance Use Topics  . Alcohol use: No  . Drug use: No    Home Medications Prior to Admission medications   Medication Sig Start Date End Date Taking? Authorizing Provider  amLODipine (NORVASC) 10 MG tablet Take 1 tablet (10 mg total) by mouth daily. 11/23/19 11/22/20  Mare Ferrari, PA-C  cyclobenzaprine (FLEXERIL) 5 MG tablet Take 1 tablet (5 mg total) by mouth at bedtime as needed for muscle spasms. 05/01/20   Merrilee Jansky, MD  ibuprofen (ADVIL) 600 MG tablet Take 1 tablet (600 mg total) by mouth every 6 (six) hours as needed. 05/01/20   Merrilee Jansky, MD  insulin glargine (LANTUS) 100 UNIT/ML injection Inject 0.8 mLs (80 Units total) into the skin every morning. 06/23/15   Tomasita Crumble, MD  lovastatin (MEVACOR) 20 MG tablet Take 1 tablet (20 mg  total) by mouth at bedtime. 06/23/15   Tomasita Crumble, MD  metFORMIN (GLUCOPHAGE) 500 MG tablet Take 1 tablet (500 mg total) by mouth at bedtime. 12/14/17 12/14/18  Loleta Rose, MD  traMADol (ULTRAM) 50 MG tablet Take 1 tablet (50 mg total) by mouth every 12 (twelve) hours as needed for moderate pain or severe pain. 05/01/20   Merrilee Jansky, MD  triamterene-hydrochlorothiazide (MAXZIDE-25) 37.5-25 MG tablet Take 1 tablet by mouth daily. 05/01/20   Lamptey, Britta Mccreedy, MD  hydrochlorothiazide (HYDRODIURIL) 25 MG tablet Take 1 tablet (25 mg total) by mouth daily. 11/23/19 05/01/20  Mare Ferrari, PA-C    Allergies    Penicillins  Review of Systems   Review of Systems  All other systems reviewed and are negative.   Physical Exam Updated Vital Signs BP 139/84 (BP Location: Right Arm)   Pulse 80   Temp 98.6 F (37 C) (Oral)   Resp 18   Ht 5\' 9"  (1.753 m)   Wt 83.5 kg   SpO2 100%   BMI 27.18 kg/m   Physical Exam Vitals and nursing note reviewed.  Constitutional:      General: He is not in acute distress.    Appearance: He is well-developed. He is not diaphoretic.  HENT:     Head: Normocephalic and atraumatic.  Cardiovascular:     Rate  and Rhythm: Normal rate and regular rhythm.     Heart sounds: No murmur heard. No friction rub.  Pulmonary:     Effort: Pulmonary effort is normal. No respiratory distress.     Breath sounds: Normal breath sounds. No wheezing or rales.  Abdominal:     General: Bowel sounds are normal. There is no distension.     Palpations: Abdomen is soft.     Tenderness: There is no abdominal tenderness.  Genitourinary:    Rectum: Normal.     Comments: Stool is dark brown in color and is Hemoccult positive Musculoskeletal:        General: Normal range of motion.     Cervical back: Normal range of motion and neck supple.  Skin:    General: Skin is warm and dry.  Neurological:     Mental Status: He is alert and oriented to person, place, and time.      Coordination: Coordination normal.     ED Results / Procedures / Treatments   Labs (all labs ordered are listed, but only abnormal results are displayed) Labs Reviewed  COMPREHENSIVE METABOLIC PANEL - Abnormal; Notable for the following components:      Result Value   Sodium 133 (*)    Chloride 96 (*)    Glucose, Bld 349 (*)    BUN 33 (*)    Creatinine, Ser 1.33 (*)    Total Protein 6.2 (*)    Albumin 3.1 (*)    AST 9 (*)    All other components within normal limits  CBC - Abnormal; Notable for the following components:   Hemoglobin 12.5 (*)    HCT 37.0 (*)    All other components within normal limits  HEMOGLOBIN AND HEMATOCRIT, BLOOD - Abnormal; Notable for the following components:   Hemoglobin 11.5 (*)    HCT 33.6 (*)    All other components within normal limits  POC OCCULT BLOOD, ED  POC OCCULT BLOOD, ED  TYPE AND SCREEN  ABO/RH    EKG EKG Interpretation  Date/Time:  Monday June 25 2020 00:52:20 EDT Ventricular Rate:  87 PR Interval:  154 QRS Duration: 102 QT Interval:  392 QTC Calculation: 471 R Axis:   29 Text Interpretation: Normal sinus rhythm Incomplete right bundle branch block ST & T wave abnormality, consider inferolateral ischemia Prolonged QT Abnormal ECG When compared with ECG of 12/14/2017, QT has lengthened Confirmed by Dione Booze ([id number]) on 06/25/2020 1:02:08 AM   Radiology No results found.  Procedures Procedures   Medications Ordered in ED Medications - No data to display  ED Course  I have reviewed the triage vital signs and the nursing notes.  Pertinent labs & imaging results that were available during my care of the patient were reviewed by me and considered in my medical decision making (see chart for details).    MDM Rules/Calculators/A&P  Patient is a 55 year old male presenting with dark stools after taking NSAIDs for shoulder pain.  I suspect patient has gastritis/possible early ulcer.  Initial hemoglobin is 12.5, then 4  hours later was repeated and is 11.5.  He has been hemodynamically stable and has had no further melena in the ER.  At this point, discharge seems appropriate with outpatient GI follow-up.  Patient will be started on Protonix and advised to discontinue his NSAID use.  I will prescribe prednisone which she can take for his shoulder pain and is to take this with food.  He understands to return to the ER  if his melena worsens or he develops lightheadedness/dizziness.  Final Clinical Impression(s) / ED Diagnoses Final diagnoses:  None    Rx / DC Orders ED Discharge Orders    None       Geoffery Lyons, MD 06/25/20 [phone]

## 2020-06-26 ENCOUNTER — Emergency Department (HOSPITAL_COMMUNITY): Payer: Self-pay

## 2020-06-26 ENCOUNTER — Other Ambulatory Visit: Payer: Self-pay

## 2020-06-26 ENCOUNTER — Encounter (HOSPITAL_COMMUNITY): Payer: Self-pay | Admitting: Emergency Medicine

## 2020-06-26 ENCOUNTER — Inpatient Hospital Stay (HOSPITAL_COMMUNITY)
Admission: EM | Admit: 2020-06-26 | Discharge: 2020-06-30 | DRG: 378 | Disposition: A | Discharge status: Home / Self Care | Payer: Self-pay | Attending: Internal Medicine | Admitting: Internal Medicine

## 2020-06-26 DIAGNOSIS — K921 Melena: Secondary | ICD-10-CM

## 2020-06-26 DIAGNOSIS — R739 Hyperglycemia, unspecified: Secondary | ICD-10-CM | POA: Insufficient documentation

## 2020-06-26 DIAGNOSIS — E663 Overweight: Secondary | ICD-10-CM | POA: Diagnosis present

## 2020-06-26 DIAGNOSIS — H538 Other visual disturbances: Secondary | ICD-10-CM | POA: Diagnosis present

## 2020-06-26 DIAGNOSIS — Z880 Allergy status to penicillin: Secondary | ICD-10-CM

## 2020-06-26 DIAGNOSIS — D62 Acute posthemorrhagic anemia: Secondary | ICD-10-CM

## 2020-06-26 DIAGNOSIS — Z9114 Patient's other noncompliance with medication regimen: Secondary | ICD-10-CM

## 2020-06-26 DIAGNOSIS — I10 Essential (primary) hypertension: Secondary | ICD-10-CM | POA: Diagnosis present

## 2020-06-26 DIAGNOSIS — Z20822 Contact with and (suspected) exposure to covid-19: Secondary | ICD-10-CM | POA: Diagnosis present

## 2020-06-26 DIAGNOSIS — G8929 Other chronic pain: Secondary | ICD-10-CM | POA: Diagnosis present

## 2020-06-26 DIAGNOSIS — K269 Duodenal ulcer, unspecified as acute or chronic, without hemorrhage or perforation: Secondary | ICD-10-CM

## 2020-06-26 DIAGNOSIS — Z7952 Long term (current) use of systemic steroids: Secondary | ICD-10-CM

## 2020-06-26 DIAGNOSIS — K250 Acute gastric ulcer with hemorrhage: Principal | ICD-10-CM

## 2020-06-26 DIAGNOSIS — E1165 Type 2 diabetes mellitus with hyperglycemia: Secondary | ICD-10-CM | POA: Diagnosis present

## 2020-06-26 DIAGNOSIS — Z7984 Long term (current) use of oral hypoglycemic drugs: Secondary | ICD-10-CM

## 2020-06-26 DIAGNOSIS — D649 Anemia, unspecified: Secondary | ICD-10-CM | POA: Diagnosis present

## 2020-06-26 DIAGNOSIS — E876 Hypokalemia: Secondary | ICD-10-CM | POA: Diagnosis present

## 2020-06-26 DIAGNOSIS — F17210 Nicotine dependence, cigarettes, uncomplicated: Secondary | ICD-10-CM | POA: Diagnosis present

## 2020-06-26 DIAGNOSIS — Z79899 Other long term (current) drug therapy: Secondary | ICD-10-CM

## 2020-06-26 DIAGNOSIS — K2951 Unspecified chronic gastritis with bleeding: Secondary | ICD-10-CM | POA: Diagnosis present

## 2020-06-26 DIAGNOSIS — F1010 Alcohol abuse, uncomplicated: Secondary | ICD-10-CM | POA: Diagnosis present

## 2020-06-26 DIAGNOSIS — Z794 Long term (current) use of insulin: Secondary | ICD-10-CM

## 2020-06-26 DIAGNOSIS — E1141 Type 2 diabetes mellitus with diabetic mononeuropathy: Secondary | ICD-10-CM | POA: Diagnosis present

## 2020-06-26 DIAGNOSIS — N179 Acute kidney failure, unspecified: Secondary | ICD-10-CM

## 2020-06-26 DIAGNOSIS — K449 Diaphragmatic hernia without obstruction or gangrene: Secondary | ICD-10-CM | POA: Diagnosis present

## 2020-06-26 DIAGNOSIS — K254 Chronic or unspecified gastric ulcer with hemorrhage: Secondary | ICD-10-CM | POA: Diagnosis present

## 2020-06-26 DIAGNOSIS — K922 Gastrointestinal hemorrhage, unspecified: Secondary | ICD-10-CM | POA: Diagnosis present

## 2020-06-26 DIAGNOSIS — Z6827 Body mass index (BMI) 27.0-27.9, adult: Secondary | ICD-10-CM

## 2020-06-26 DIAGNOSIS — W19XXXA Unspecified fall, initial encounter: Secondary | ICD-10-CM

## 2020-06-26 DIAGNOSIS — K264 Chronic or unspecified duodenal ulcer with hemorrhage: Secondary | ICD-10-CM | POA: Diagnosis present

## 2020-06-26 DIAGNOSIS — Z7289 Other problems related to lifestyle: Secondary | ICD-10-CM

## 2020-06-26 DIAGNOSIS — E785 Hyperlipidemia, unspecified: Secondary | ICD-10-CM | POA: Diagnosis present

## 2020-06-26 LAB — RESP PANEL BY RT-PCR (FLU A&B, COVID) ARPGX2
Influenza A by PCR: NEGATIVE
Influenza B by PCR: NEGATIVE
SARS Coronavirus 2 by RT PCR: NEGATIVE

## 2020-06-26 LAB — URINALYSIS, ROUTINE W REFLEX MICROSCOPIC
Bacteria, UA: NONE SEEN
Bilirubin Urine: NEGATIVE
Glucose, UA: >=500 mg/dL — AB
Hgb urine dipstick: NEGATIVE
Ketones, ur: NEGATIVE mg/dL
Leukocytes,Ua: NEGATIVE
Nitrite: NEGATIVE
Protein, ur: NEGATIVE mg/dL
Specific Gravity, Urine: 1.021 (ref 1.005–1.030)
pH: 5 (ref 5.0–8.0)

## 2020-06-26 LAB — URINALYSIS, COMPLETE (UACMP) WITH MICROSCOPIC
Bacteria, UA: NONE SEEN
Bilirubin Urine: NEGATIVE
Glucose, UA: >=500 mg/dL — AB
Hgb urine dipstick: NEGATIVE
Ketones, ur: NEGATIVE mg/dL
Nitrite: NEGATIVE
Protein, ur: NEGATIVE mg/dL
Specific Gravity, Urine: 1.021 (ref 1.005–1.030)
pH: 5 (ref 5.0–8.0)

## 2020-06-26 LAB — CBG MONITORING, ED
Glucose-Capillary: 180 mg/dL — ABNORMAL HIGH (ref 70–99)
Glucose-Capillary: 350 mg/dL — ABNORMAL HIGH (ref 70–99)
Glucose-Capillary: 435 mg/dL — ABNORMAL HIGH (ref 70–99)
Glucose-Capillary: 475 mg/dL — ABNORMAL HIGH (ref 70–99)
Glucose-Capillary: 500 mg/dL — ABNORMAL HIGH (ref 70–99)

## 2020-06-26 LAB — CBC WITH DIFFERENTIAL/PLATELET
Abs Immature Granulocytes: 0.03 10*3/uL (ref 0.00–0.07)
Basophils Absolute: 0 10*3/uL (ref 0.0–0.1)
Basophils Relative: 0 %
Eosinophils Absolute: 0 10*3/uL (ref 0.0–0.5)
Eosinophils Relative: 0 %
HCT: 17.5 % — ABNORMAL LOW (ref 39.0–52.0)
Hemoglobin: 6 g/dL — CL (ref 13.0–17.0)
Immature Granulocytes: 0 %
Lymphocytes Relative: 8 %
Lymphs Abs: 0.7 10*3/uL (ref 0.7–4.0)
MCH: 28.3 pg (ref 26.0–34.0)
MCHC: 34.3 g/dL (ref 30.0–36.0)
MCV: 82.5 fL (ref 80.0–100.0)
Monocytes Absolute: 0.2 10*3/uL (ref 0.1–1.0)
Monocytes Relative: 2 %
Neutro Abs: 7.3 10*3/uL (ref 1.7–7.7)
Neutrophils Relative %: 90 %
Platelets: 223 10*3/uL (ref 150–400)
RBC: 2.12 MIL/uL — ABNORMAL LOW (ref 4.22–5.81)
RDW: 12.7 % (ref 11.5–15.5)
WBC: 8.1 10*3/uL (ref 4.0–10.5)
nRBC: 0 % (ref 0.0–0.2)

## 2020-06-26 LAB — PREPARE RBC (CROSSMATCH)

## 2020-06-26 LAB — BASIC METABOLIC PANEL
Anion gap: 5 (ref 5–15)
BUN: 53 mg/dL — ABNORMAL HIGH (ref 6–20)
CO2: 24 mmol/L (ref 22–32)
Calcium: 7.7 mg/dL — ABNORMAL LOW (ref 8.9–10.3)
Chloride: 101 mmol/L (ref 98–111)
Creatinine, Ser: 1.68 mg/dL — ABNORMAL HIGH (ref 0.61–1.24)
GFR, Estimated: 48 mL/min — ABNORMAL LOW (ref >60)
Glucose, Bld: 523 mg/dL (ref 70–99)
Potassium: 3.9 mmol/L (ref 3.5–5.1)
Sodium: 130 mmol/L — ABNORMAL LOW (ref 135–145)

## 2020-06-26 LAB — CREATININE, URINE, RANDOM: Creatinine, Urine: 42.91 mg/dL

## 2020-06-26 LAB — SODIUM, URINE, RANDOM: Sodium, Ur: 32 mmol/L

## 2020-06-26 LAB — POC OCCULT BLOOD, ED: Fecal Occult Bld: POSITIVE — AB

## 2020-06-26 MED ORDER — POTASSIUM CHLORIDE 10 MEQ/100ML IV SOLN
10.0000 meq | Freq: Once | INTRAVENOUS | Status: AC
  Administered 2020-06-26: 10 meq via INTRAVENOUS
  Filled 2020-06-26: qty 100

## 2020-06-26 MED ORDER — PANTOPRAZOLE SODIUM 40 MG IV SOLR
40.0000 mg | Freq: Once | INTRAVENOUS | Status: AC
  Administered 2020-06-26: 40 mg via INTRAVENOUS
  Filled 2020-06-26: qty 40

## 2020-06-26 MED ORDER — DEXTROSE IN LACTATED RINGERS 5 % IV SOLN: INTRAVENOUS | Status: DC

## 2020-06-26 MED ORDER — NICOTINE 21 MG/24HR TD PT24
21.0000 mg | MEDICATED_PATCH | Freq: Every day | TRANSDERMAL | Status: DC
  Administered 2020-06-27 – 2020-06-30 (×4): 21 mg via TRANSDERMAL
  Filled 2020-06-26 (×4): qty 1

## 2020-06-26 MED ORDER — DEXTROSE 50 % IV SOLN: 0.0000 mL | INTRAVENOUS | Status: DC | PRN

## 2020-06-26 MED ORDER — LACTATED RINGERS IV BOLUS
20.0000 mL/kg | Freq: Once | INTRAVENOUS | Status: AC
  Administered 2020-06-26: 1670 mL via INTRAVENOUS

## 2020-06-26 MED ORDER — SODIUM CHLORIDE 0.9 % IV BOLUS
1000.0000 mL | Freq: Once | INTRAVENOUS | Status: AC
  Administered 2020-06-26: 1000 mL via INTRAVENOUS

## 2020-06-26 MED ORDER — LACTATED RINGERS IV SOLN: INTRAVENOUS | Status: DC

## 2020-06-26 MED ORDER — SODIUM CHLORIDE 0.9 % IV SOLN
8.0000 mg/h | INTRAVENOUS | Status: DC
  Administered 2020-06-26 – 2020-06-29 (×7): 8 mg/h via INTRAVENOUS
  Filled 2020-06-26 (×11): qty 80

## 2020-06-26 MED ORDER — SODIUM CHLORIDE 0.9 % IV SOLN: 10.0000 mL/h | Freq: Once | INTRAVENOUS | Status: DC

## 2020-06-26 MED ORDER — INSULIN REGULAR(HUMAN) IN NACL 100-0.9 UT/100ML-% IV SOLN
INTRAVENOUS | Status: DC
  Administered 2020-06-26: 12 [IU]/h via INTRAVENOUS
  Administered 2020-06-26: 2 [IU]/h via INTRAVENOUS
  Administered 2020-06-27: 2.6 [IU]/h via INTRAVENOUS
  Administered 2020-06-27: 4.4 [IU]/h via INTRAVENOUS
  Administered 2020-06-27: 3.4 [IU]/h via INTRAVENOUS
  Administered 2020-06-27: 10.5 [IU]/h via INTRAVENOUS
  Administered 2020-06-27: 2.8 [IU]/h via INTRAVENOUS
  Administered 2020-06-27: 0.6 [IU]/h via INTRAVENOUS
  Filled 2020-06-26 (×2): qty 100

## 2020-06-26 NOTE — ED Triage Notes (Signed)
Pt BIB [facility] from home. Complaint of generally not feeling well for several days. Pt cbg reads high with EMS, cbg 500 after 750 cc NS. A&Ox4

## 2020-06-26 NOTE — H&P (Signed)
History and Physical    Jose Kassony L Bulkley ZOX:[id number] DOB: 03/02/1966 DOA: 06/26/2020  PCP: Patient, No Pcp Per (Inactive)   Patient coming from: Home   Chief Complaint: Melena, lightheadedness, polyuria, polydipsia, blurred vision   HPI: Jose Morrow is a 55 y.o. male with medical history significant for type 2 diabetes mellitus and hypertension, now presenting to the emergency department for evaluation of melena, blurred vision, lightheadedness, polyuria, and polydipsia.  Patient suffered a right shoulder injury at work in early March, started on muscle relaxer and ibuprofen, has been taking 4 or 5 tablets of ibuprofen multiple times daily, and then developed heartburn and black stools 3 days ago.  He was seen in the emergency department yesterday, hemoglobin was fairly stable, he was instructed to discontinue NSAID, given Protonix and prednisone, and returned home.  He has continued to have dark stools and has also developed polyuria, polydipsia, lightheadedness, and blurred vision.  He denies nausea or vomiting.  he denies any chest pain or cough.  He denies any fevers or chills.  He has not lost consciousness.  ED Course: Upon arrival to the ED, patient is found to be afebrile, saturating well on room air, tachycardic to 130s, and with stable blood pressure.  Chemistry panel notable for BUN of 53, creatinine 1.68, and glucose 523.  Hemoglobin is 6.0, down from 12.5 yesterday.  FOBT is positive.  Gastroenterology was consulted by the ED physician, patient was given 2.7 L of IV fluids, started on IV Protonix, and 2 units of RBC were ordered for transfusion.  Review of Systems:  All other systems reviewed and apart from HPI, are negative.  Past Medical History:  Diagnosis Date  . Diabetes mellitus   . Hypertension     History reviewed. No pertinent surgical history.  Social History:   reports that he has been smoking. He has never used smokeless tobacco. He reports that he does not drink  alcohol and does not use drugs.  Allergies  Allergen Reactions  . Penicillins Anaphylaxis    History reviewed. No pertinent family history.   Prior to Admission medications   Medication Sig Start Date End Date Taking? Authorizing Provider  amLODipine (NORVASC) 10 MG tablet Take 1 tablet (10 mg total) by mouth daily. 11/23/19 11/22/20 Yes Mare FerrariBelaya, Maria A, PA-C  ibuprofen (ADVIL) 600 MG tablet Take 1 tablet (600 mg total) by mouth every 6 (six) hours as needed. Patient taking differently: Take 600 mg by mouth every 6 (six) hours as needed for mild pain. 05/01/20  Yes Lamptey, Britta MccreedyPhilip O, MD  lovastatin (MEVACOR) 20 MG tablet Take 1 tablet (20 mg total) by mouth at bedtime. 06/23/15  Yes Tomasita Crumbleni, Adeleke, MD  metFORMIN (GLUCOPHAGE) 500 MG tablet Take 1 tablet (500 mg total) by mouth at bedtime. 12/14/17 12/14/18 Yes Loleta RoseForbach, Cory, MD  pantoprazole (PROTONIX) 20 MG tablet Take 1 tablet (20 mg total) by mouth 2 (two) times daily. 06/25/20  Yes Delo, Riley Lamouglas, MD  predniSONE (DELTASONE) 10 MG tablet Take 2 tablets (20 mg total) by mouth 2 (two) times daily with a meal. 06/25/20  Yes Delo, Riley Lamouglas, MD  traMADol (ULTRAM) 50 MG tablet Take 1 tablet (50 mg total) by mouth every 12 (twelve) hours as needed for moderate pain or severe pain. 05/01/20  Yes Lamptey, Britta MccreedyPhilip O, MD  triamterene-hydrochlorothiazide (MAXZIDE-25) 37.5-25 MG tablet Take 1 tablet by mouth daily. 05/01/20  Yes Lamptey, Britta MccreedyPhilip O, MD  hydrochlorothiazide (HYDRODIURIL) 25 MG tablet Take 1 tablet (25 mg total) by mouth  daily. 11/23/19 05/01/20  Mare Ferrari, PA-C    Physical Exam: Vitals:   06/26/20 1700 06/26/20 1856 06/26/20 1930 06/26/20 1950  BP: (!) 156/87 (!) 156/91 (!) 157/82 (!) 151/90  Pulse: 90 (!) 104 (!) 132 93  Resp: (!) 24 18 19  (!) 23  Temp:   98.5 F (36.9 C) 98.7 F (37.1 C)  TempSrc:   Oral Oral  SpO2: 100% 99% 100% 96%  Weight:      Height:        Constitutional: NAD, calm  Eyes: PERTLA, lids and conjunctivae  normal ENMT: Mucous membranes are moist. Posterior pharynx clear of any exudate or lesions.   Neck: supple, no masses  Respiratory: no wheezing, no crackles. No accessory muscle use.  Cardiovascular: S1 & S2 heard, regular rate and rhythm. No extremity edema.  Abdomen: No distension, no tenderness, soft. Bowel sounds active.  Musculoskeletal: no clubbing / cyanosis. No joint deformity upper and lower extremities.   Skin: no significant rashes, lesions, ulcers. Warm, dry, well-perfused. Neurologic: CN 2-12 grossly intact. Sensation intact. Moving all extremities.  Psychiatric: Alert and oriented to person, place, and situation. Pleasant and cooperative.    Labs and Imaging on Admission: I have personally reviewed following labs and imaging studies  CBC: Recent Labs  Lab 06/25/20 0103 06/25/20 0455 06/26/20 1730  WBC 7.8  --  8.1  NEUTROABS  --   --  7.3  HGB 12.5* 11.5* 6.0*  HCT 37.0* 33.6* 17.5*  MCV 81.7  --  82.5  PLT 315  --  223   Basic Metabolic Panel: Recent Labs  Lab 06/25/20 0103 06/26/20 1510  NA 133* 130*  K 4.2 3.9  CL 96* 101  CO2 29 24  GLUCOSE 349* 523*  BUN 33* 53*  CREATININE 1.33* 1.68*  CALCIUM 9.1 7.7*   GFR: Estimated Creatinine Clearance: 50.3 mL/min (A) (by C-G formula based on SCr of 1.68 mg/dL (H)). Liver Function Tests: Recent Labs  Lab 06/25/20 0103  AST 9*  ALT 9  ALKPHOS 47  BILITOT 0.5  PROT 6.2*  ALBUMIN 3.1*   No results for input(s): LIPASE, AMYLASE in the last 168 hours. No results for input(s): AMMONIA in the last 168 hours. Coagulation Profile: No results for input(s): INR, PROTIME in the last 168 hours. Cardiac Enzymes: No results for input(s): CKTOTAL, CKMB, CKMBINDEX, TROPONINI in the last 168 hours. BNP (last 3 results) No results for input(s): PROBNP in the last 8760 hours. HbA1C: No results for input(s): HGBA1C in the last 72 hours. CBG: Recent Labs  Lab 06/26/20 1445 06/26/20 1555 06/26/20 2059  GLUCAP  500* 475* 435*   Lipid Profile: No results for input(s): CHOL, HDL, LDLCALC, TRIG, CHOLHDL, LDLDIRECT in the last 72 hours. Thyroid Function Tests: No results for input(s): TSH, T4TOTAL, FREET4, T3FREE, THYROIDAB in the last 72 hours. Anemia Panel: No results for input(s): VITAMINB12, FOLATE, FERRITIN, TIBC, IRON, RETICCTPCT in the last 72 hours. Urine analysis:    Component Value Date/Time   COLORURINE STRAW (A) 06/26/2020 1958   APPEARANCEUR CLEAR 06/26/2020 1958   LABSPEC 1.021 06/26/2020 1958   PHURINE 5.0 06/26/2020 1958   GLUCOSEU >=500 (A) 06/26/2020 1958   HGBUR NEGATIVE 06/26/2020 1958   BILIRUBINUR NEGATIVE 06/26/2020 1958   KETONESUR NEGATIVE 06/26/2020 1958   PROTEINUR NEGATIVE 06/26/2020 1958   UROBILINOGEN 0.2 06/12/2008 1737   NITRITE NEGATIVE 06/26/2020 1958   LEUKOCYTESUR NEGATIVE 06/26/2020 1958   Sepsis Labs: @LABRCNTIP (procalcitonin:4,lacticidven:4) ) Recent Results (from the past 240 hour(s))  Resp Panel by RT-PCR (Flu A&B, Covid) Nasopharyngeal Swab     Status: None   Collection Time: 06/26/20  8:10 PM   Specimen: Nasopharyngeal Swab; Nasopharyngeal(NP) swabs in vial transport medium  Result Value Ref Range Status   SARS Coronavirus 2 by RT PCR NEGATIVE NEGATIVE Final    Comment: (NOTE) SARS-CoV-2 target nucleic acids are NOT DETECTED.  The SARS-CoV-2 RNA is generally detectable in upper respiratory specimens during the acute phase of infection. The lowest concentration of SARS-CoV-2 viral copies this assay can detect is 138 copies/mL. A negative result does not preclude SARS-Cov-2 infection and should not be used as the sole basis for treatment or other patient management decisions. A negative result may occur with  improper specimen collection/handling, submission of specimen other than nasopharyngeal swab, presence of viral mutation(s) within the areas targeted by this assay, and inadequate number of viral copies(<138 copies/mL). A negative result  must be combined with clinical observations, patient history, and epidemiological information. The expected result is Negative.  Fact Sheet for Patients:  BloggerCourse.com  Fact Sheet for Healthcare Providers:  SeriousBroker.it  This test is no t yet approved or cleared by the [place] FDA and  has been authorized for detection and/or diagnosis of SARS-CoV-2 by FDA under an Emergency Use Authorization (EUA). This EUA will remain  in effect (meaning this test can be used) for the duration of the COVID-19 declaration under Section 564(b)(1) of the Act, 21 U.S.C.section 360bbb-3(b)(1), unless the authorization is terminated  or revoked sooner.       Influenza A by PCR NEGATIVE NEGATIVE Final   Influenza B by PCR NEGATIVE NEGATIVE Final    Comment: (NOTE) The Xpert Xpress SARS-CoV-2/FLU/RSV plus assay is intended as an aid in the diagnosis of influenza from Nasopharyngeal swab specimens and should not be used as a sole basis for treatment. Nasal washings and aspirates are unacceptable for Xpert Xpress SARS-CoV-2/FLU/RSV testing.  Fact Sheet for Patients: BloggerCourse.com  Fact Sheet for Healthcare Providers: SeriousBroker.it  This test is not yet approved or cleared by the [place] FDA and has been authorized for detection and/or diagnosis of SARS-CoV-2 by FDA under an Emergency Use Authorization (EUA). This EUA will remain in effect (meaning this test can be used) for the duration of the COVID-19 declaration under Section 564(b)(1) of the Act, 21 U.S.C. section 360bbb-3(b)(1), unless the authorization is terminated or revoked.  Performed at [facility], [address], [place], [place] [address]      Radiological Exams on Admission: DG Chest Port 1 View  Result Date: 06/26/2020 CLINICAL DATA:  Short of breath, malaise EXAM: PORTABLE CHEST 1 VIEW  COMPARISON:  12/14/2017 FINDINGS: The heart size and mediastinal contours are within normal limits. Both lungs are clear. The visualized skeletal structures are unremarkable. IMPRESSION: No active disease. Electronically Signed   By: Marlan Palau M.D.   On: 06/26/2020 16:41    Assessment/Plan   1. Acute upper GI bleed; symptomatic anemia  - Presents with 3 days of melena in setting of high-dose ibuprofen use and is found to have Hgb 6.0, down from 12.5 the day prior  - BUN significantly increased; FOBT is +  - HR was 130, BP stable   - GI was consulted by ED PA, IV PPI was started, 2 units RBC ordered, and IVF bolus given with normalization in HR  - Continue IV PPI, bowel rest, IVF hydration, repeat H&H after 2 units     2. Uncontrolled type II DM  -  Serum glucose 523 in ED in setting of running out of diabetes medications and starting prednisone for shoulder pain  - A1c 6.5% in 2019 - Start insulin infusion with frequent CBGs    3. Acute kidney injury  - SCr is 1.68 on admission, up from 1.33 the day before and 1.22 a month ago  - Likely acute prerenal azotemia in setting of acute blood loss; has also been taking high-dose ibuprofen  - Check UA and urine chemistries, continue IVF hydration, renally-dose medications, repeat serum chemistries in am    4. Hypertension  - Hold scheduled antihypertensives for now in setting of acute upper GIB, treat as needed     DVT prophylaxis: SCDs  Code Status: Full  Level of Care: Level of care: Progressive Family Communication: None present  Disposition Plan:  Patient is from: Home  Anticipated d/c is to: Home  Anticipated d/c date is: 06/29/20 Patient currently: Pending blood transfusion, GI consultation, glycemic-control  Consults called:  GI Admission status: Inpatient    Briscoe Deutscher, MD [facility]  06/26/2020, 9:24 PM

## 2020-06-26 NOTE — ED Provider Notes (Addendum)
[facility]   CSN: [id number] Arrival date & time: 06/26/20  1442     History Chief Complaint  Patient presents with  . Hyperglycemia    Larrie Kassony L Orris is a 55 y.o. male.  Patient with history of diabetes, hypertension, presents to the emergency department today for an evaluation of feeling poorly and with high blood sugar.  Patient states that he has been having right-sided shoulder pain for several months and attributes this to a "pinched nerve".  Patient has noted black stools recently in setting of taking over-the-counter NSAIDs for his shoulder pain.  Patient was seen in the emergency department yesterday due to dark stool.  He was started on Protonix.  Patient states that he continues to feel badly prompting emergency department visit today.  He reports some lightheadedness with standing and some shortness of breath when he walks around.  No fevers documented.  No cough or URI symptoms.  No chest pain.  He has had nausea but no vomiting.  No diarrhea or urinary symptoms.  He does not currently have a primary care doctor.  He states that he has been out of metformin for about 2 weeks.  He went through a divorce and lost medical coverage at that time.         Past Medical History:  Diagnosis Date  . Diabetes mellitus   . Hypertension     There are no problems to display for this patient.   History reviewed. No pertinent surgical history.     History reviewed. No pertinent family history.  Social History   Tobacco Use  . Smoking status: Current Every Day Smoker  . Smokeless tobacco: Never Used  Substance Use Topics  . Alcohol use: No  . Drug use: No    Home Medications Prior to Admission medications   Medication Sig Start Date End Date Taking? Authorizing Provider  amLODipine (NORVASC) 10 MG tablet Take 1 tablet (10 mg total) by mouth daily. 11/23/19 11/22/20  Mare FerrariBelaya, Maria A, PA-C  cyclobenzaprine (FLEXERIL) 5 MG  tablet Take 1 tablet (5 mg total) by mouth at bedtime as needed for muscle spasms. 05/01/20   Merrilee JanskyLamptey, Philip O, MD  ibuprofen (ADVIL) 600 MG tablet Take 1 tablet (600 mg total) by mouth every 6 (six) hours as needed. 05/01/20   Merrilee JanskyLamptey, Philip O, MD  insulin glargine (LANTUS) 100 UNIT/ML injection Inject 0.8 mLs (80 Units total) into the skin every morning. 06/23/15   Tomasita Crumbleni, Adeleke, MD  lovastatin (MEVACOR) 20 MG tablet Take 1 tablet (20 mg total) by mouth at bedtime. 06/23/15   Tomasita Crumbleni, Adeleke, MD  metFORMIN (GLUCOPHAGE) 500 MG tablet Take 1 tablet (500 mg total) by mouth at bedtime. 12/14/17 12/14/18  Loleta RoseForbach, Cory, MD  pantoprazole (PROTONIX) 20 MG tablet Take 1 tablet (20 mg total) by mouth 2 (two) times daily. 06/25/20   Geoffery Lyonselo, Douglas, MD  predniSONE (DELTASONE) 10 MG tablet Take 2 tablets (20 mg total) by mouth 2 (two) times daily with a meal. 06/25/20   Geoffery Lyonselo, Douglas, MD  traMADol (ULTRAM) 50 MG tablet Take 1 tablet (50 mg total) by mouth every 12 (twelve) hours as needed for moderate pain or severe pain. 05/01/20   Merrilee JanskyLamptey, Philip O, MD  triamterene-hydrochlorothiazide (MAXZIDE-25) 37.5-25 MG tablet Take 1 tablet by mouth daily. 05/01/20   Lamptey, Britta MccreedyPhilip O, MD  hydrochlorothiazide (HYDRODIURIL) 25 MG tablet Take 1 tablet (25 mg total) by mouth daily. 11/23/19 05/01/20  Mare FerrariBelaya, Maria A, PA-C  Allergies    Penicillins  Review of Systems   Review of Systems  Constitutional: Positive for fatigue. Negative for fever.  HENT: Negative for rhinorrhea and sore throat.   Eyes: Negative for redness.  Respiratory: Positive for shortness of breath. Negative for cough.   Cardiovascular: Negative for chest pain.  Gastrointestinal: Positive for blood in stool (black stool). Negative for abdominal pain, diarrhea, nausea and vomiting.  Genitourinary: Negative for dysuria and hematuria.  Musculoskeletal: Negative for myalgias.  Skin: Negative for rash.  Neurological: Positive for light-headedness. Negative for  headaches.    Physical Exam Updated Vital Signs BP 129/80   Pulse 89   Temp 98.3 F (36.8 C) (Oral)   Resp (!) 25   SpO2 100%   Physical Exam Vitals and nursing note reviewed.  Constitutional:      Appearance: He is well-developed.  HENT:     Head: Normocephalic and atraumatic.     Mouth/Throat:     Mouth: Mucous membranes are dry.  Eyes:     General:        Right eye: No discharge.        Left eye: No discharge.     Conjunctiva/sclera: Conjunctivae normal.     Comments: Conjunctiva mildly pale.  Cardiovascular:     Rate and Rhythm: Normal rate and regular rhythm.     Heart sounds: Normal heart sounds.  Pulmonary:     Effort: Pulmonary effort is normal.     Breath sounds: Normal breath sounds.  Abdominal:     Palpations: Abdomen is soft.     Tenderness: There is abdominal tenderness. There is no guarding or rebound.     Comments: Minimal epigastric tenderness that he describes as burning  Genitourinary:    Rectum: Guaiac result positive.     Comments: Patient with melanotic stool, he did not have much stool in the rectum.  No maroon or red-colored stool. Musculoskeletal:     Cervical back: Normal range of motion and neck supple.     Right lower leg: No edema.     Left lower leg: No edema.  Skin:    General: Skin is warm and dry.  Neurological:     Mental Status: He is alert.     ED Results / Procedures / Treatments   Labs (all labs ordered are listed, but only abnormal results are displayed) Labs Reviewed  BASIC METABOLIC PANEL - Abnormal; Notable for the following components:      Result Value   Sodium 130 (*)    Glucose, Bld 523 (*)    BUN 53 (*)    Creatinine, Ser 1.68 (*)    Calcium 7.7 (*)    GFR, Estimated 48 (*)    All other components within normal limits  CBC WITH DIFFERENTIAL/PLATELET - Abnormal; Notable for the following components:   RBC 2.12 (*)    Hemoglobin 6.0 (*)    HCT 17.5 (*)    All other components within normal limits  CBG  MONITORING, ED - Abnormal; Notable for the following components:   Glucose-Capillary 475 (*)    All other components within normal limits  CBG MONITORING, ED - Abnormal; Notable for the following components:   Glucose-Capillary 500 (*)    All other components within normal limits  POC OCCULT BLOOD, ED - Abnormal; Notable for the following components:   Fecal Occult Bld POSITIVE (*)    All other components within normal limits  RESP PANEL BY RT-PCR (FLU A&B, COVID) ARPGX2  CBC WITH DIFFERENTIAL/PLATELET  URINALYSIS, ROUTINE W REFLEX MICROSCOPIC  BETA-HYDROXYBUTYRIC ACID  PROTIME-INR  HEPATIC FUNCTION PANEL  HEMOGLOBIN AND HEMATOCRIT, BLOOD  TYPE AND SCREEN  PREPARE RBC (CROSSMATCH)    EKG None  Radiology DG Chest Port 1 View  Result Date: 06/26/2020 CLINICAL DATA:  Short of breath, malaise EXAM: PORTABLE CHEST 1 VIEW COMPARISON:  12/14/2017 FINDINGS: The heart size and mediastinal contours are within normal limits. Both lungs are clear. The visualized skeletal structures are unremarkable. IMPRESSION: No active disease. Electronically Signed   By: Marlan Palau M.D.   On: 06/26/2020 16:41    Procedures .Critical Care E&M Performed by: Renne Crigler, PA-C  Critical care provider statement:    Critical care time (minutes):  45   Critical care time was exclusive of:  Separately billable procedures and treating other patients   Critical care was necessary to treat or prevent imminent or life-threatening deterioration of the following conditions:  Circulatory failure, renal failure, shock, dehydration, endocrine crisis and metabolic crisis   Critical care was time spent personally by me on the following activities:  Development of treatment plan with patient or surrogate, discussions with consultants, evaluation of patient's response to treatment, examination of patient, obtaining history from patient or surrogate, review of old charts, re-evaluation of patient's condition, pulse  oximetry, ordering and review of laboratory studies and ordering and performing treatments and interventions   I assumed direction of critical care for this patient from another provider in my specialty: no     Care discussed with: admitting provider   After initial E/M assessment, critical care services were subsequently performed that were exclusive of separately billable procedures or treatment.       Medications Ordered in ED Medications  0.9 %  sodium chloride infusion (has no administration in time range)  pantoprazole (PROTONIX) 80 mg in sodium chloride 0.9 % 100 mL (0.8 mg/mL) infusion (8 mg/hr Intravenous New Bag/Given 06/26/20 1946)  lactated ringers bolus 1,670 mL (0 mL/kg  83.5 kg Intravenous Stopped 06/26/20 1617)  sodium chloride 0.9 % bolus 1,000 mL (0 mLs Intravenous Stopped 06/26/20 1617)  pantoprazole (PROTONIX) injection 40 mg (40 mg Intravenous Given 06/26/20 1746)  pantoprazole (PROTONIX) injection 40 mg (40 mg Intravenous Given 06/26/20 1938)    ED Course  I have reviewed the triage vital signs and the nursing notes.  Pertinent labs & imaging results that were available during my care of the patient were reviewed by me and considered in my medical decision making (see chart for details).  Patient seen and examined.  Patient with malaise.  His blood sugars are elevated and likely have been for the past couple weeks since he ran out metformin.  There is also concern for GI bleeding will need his hemoglobin rechecked.  He does not look particularly pale on exam.  He is not hypotensive or tachycardic.  Will treat with IV fluids and reassess.  He will likely need help finding a primary care doctor.  Vital signs reviewed and are as follows: BP 129/80 (BP Location: Right Arm)   Pulse 89   Temp 98.3 F (36.8 C) (Oral)   Resp 20   Ht 5\' 9"  (1.753 m)   Wt 83.5 kg   SpO2 100%   BMI 27.18 kg/m   7:36 PM patient discussed with and seen by Dr. .  Patient's hemoglobin  was rechecked and returned at 6.0.  I spoke with Dr. Renaye Rakers of [facility] by telephone so he is aware of the patient  overnight.  At present, they will consult in the morning.  Protonix drip and 2 units of blood ordered.  Will admit to medicine.  Patient updated on all results and is in agreement with plan.  At 7:30pm, pulse rate was documented at 132, however when I go to see the patient he is sitting in bed talking to one of the nurses with a heart rate of 105.  8:00 PM Spoke with Dr. Antionette Char who will see patient.     Clinical Course as of 06/26/20 1936  Tue Jun 26, 2020  426 56 year old male presented emerged department fatigue, found to have new onset anemia with melena in stool.  Hemoglobin today 6.0, down from 12.5 yesterday.  BUN 53.  While at rest, the patient has minimal symptoms.  His heart rate is in the low 90s.  Blood pressure stable.  He reports he is lightheaded and short of breath when getting up and moving around.  I therefore suspect this is likely GI bleed clear.  It is unclear whether this is upper or lower.  We will start him on Protonix.  He was consented and will receive 2 units of blood.  We will reach out to the GI team and anticipate medical admission.  Although this is a significant drop in his hemoglobin to 24 hours, at this point he does appear overall hemodynamically stable for telemetry admission. [MT]    Clinical Course User Index [MT] Trifan, Kermit Balo, MD   MDM Rules/Calculators/A&P                          Admit.    Final Clinical Impression(s) / ED Diagnoses Final diagnoses:  Acute upper GI bleeding  Hyperglycemia without ketosis  Acute kidney injury [facility])    Rx / DC Orders ED Discharge Orders    None        Renne Crigler, Cordelia Poche 06/26/20 2002    Terald Sleeper, MD 06/27/20 1036    Terald Sleeper, MD 06/27/20 1038

## 2020-06-26 NOTE — ED Triage Notes (Signed)
Emergency Medicine Provider Triage Evaluation Note  JAYLN MADEIRA , a 55 y.o. male  was evaluated in triage.  Pt complains of hyperglycemia.  Review of Systems  Positive: Blurry vision, gen weakness, polyuria, polydipsia Negative: Fever, cp, sob  Physical Exam  BP (!) 141/82 (BP Location: Left Arm)   Pulse 94   Temp 98.3 F (36.8 C) (Oral)   Resp 18   SpO2 100%  Gen:   Awake, no distress   HEENT:  Atraumatic  Resp:  Normal effort  Cardiac:  Normal rate  Abd:   Nondistended, nontender  MSK:   Moves extremities without difficulty  Neuro:  Speech clear   Medical Decision Making  Medically screening exam initiated at 3:08 PM.  Appropriate orders placed.  [name] was informed that the remainder of the evaluation will be completed by another provider, this initial triage assessment does not replace that evaluation, and the importance of remaining in the ED until their evaluation is complete.  Clinical Impression  Hx of diabetes on Metformin.  Here with sxs concerning for hyperglycemia.  CBG via EMS is high, but improves to 500 after 750cc of NS.  Was seen several days prior due to R shoulder pain and melena, 2/2 NSAIDs use for his shoulder pain.   Fayrene Helper, PA-C 06/26/20 1512

## 2020-06-26 NOTE — ED Notes (Signed)
Lab called with critical value of 6.0 for hgb.  PA made aware and is at bedside.

## 2020-06-27 ENCOUNTER — Inpatient Hospital Stay (HOSPITAL_COMMUNITY): Payer: Self-pay

## 2020-06-27 DIAGNOSIS — D62 Acute posthemorrhagic anemia: Secondary | ICD-10-CM

## 2020-06-27 DIAGNOSIS — K921 Melena: Secondary | ICD-10-CM

## 2020-06-27 LAB — HEMOGLOBIN A1C
Hgb A1c MFr Bld: 11.1 % — ABNORMAL HIGH (ref 4.8–5.6)
Mean Plasma Glucose: 271.87 mg/dL

## 2020-06-27 LAB — GLUCOSE, CAPILLARY
Glucose-Capillary: 191 mg/dL — ABNORMAL HIGH (ref 70–99)
Glucose-Capillary: 200 mg/dL — ABNORMAL HIGH (ref 70–99)
Glucose-Capillary: 261 mg/dL — ABNORMAL HIGH (ref 70–99)
Glucose-Capillary: 220 mg/dL — ABNORMAL HIGH (ref 70–99)

## 2020-06-27 LAB — BASIC METABOLIC PANEL
Anion gap: 6 (ref 5–15)
BUN: 45 mg/dL — ABNORMAL HIGH (ref 6–20)
CO2: 23 mmol/L (ref 22–32)
Calcium: 7.8 mg/dL — ABNORMAL LOW (ref 8.9–10.3)
Chloride: 110 mmol/L (ref 98–111)
Creatinine, Ser: 1.32 mg/dL — ABNORMAL HIGH (ref 0.61–1.24)
GFR, Estimated: >60 mL/min (ref >60)
Glucose, Bld: 174 mg/dL — ABNORMAL HIGH (ref 70–99)
Potassium: 4.1 mmol/L (ref 3.5–5.1)
Sodium: 139 mmol/L (ref 135–145)

## 2020-06-27 LAB — HEMOGLOBIN AND HEMATOCRIT, BLOOD
HCT: 21.2 % — ABNORMAL LOW (ref 39.0–52.0)
HCT: 17.7 % — ABNORMAL LOW (ref 39.0–52.0)
HCT: 15.8 % — ABNORMAL LOW (ref 39.0–52.0)
Hemoglobin: 6.8 g/dL — CL (ref 13.0–17.0)
Hemoglobin: 6.1 g/dL — CL (ref 13.0–17.0)
Hemoglobin: 5.5 g/dL — CL (ref 13.0–17.0)

## 2020-06-27 LAB — PROTIME-INR
INR: 1.2 (ref 0.8–1.2)
Prothrombin Time: 14.7 seconds (ref 11.4–15.2)

## 2020-06-27 LAB — CBG MONITORING, ED
Glucose-Capillary: 115 mg/dL — ABNORMAL HIGH (ref 70–99)
Glucose-Capillary: 156 mg/dL — ABNORMAL HIGH (ref 70–99)
Glucose-Capillary: 163 mg/dL — ABNORMAL HIGH (ref 70–99)
Glucose-Capillary: 200 mg/dL — ABNORMAL HIGH (ref 70–99)
Glucose-Capillary: 181 mg/dL — ABNORMAL HIGH (ref 70–99)
Glucose-Capillary: 290 mg/dL — ABNORMAL HIGH (ref 70–99)
Glucose-Capillary: 261 mg/dL — ABNORMAL HIGH (ref 70–99)
Glucose-Capillary: 204 mg/dL — ABNORMAL HIGH (ref 70–99)
Glucose-Capillary: 163 mg/dL — ABNORMAL HIGH (ref 70–99)
Glucose-Capillary: 175 mg/dL — ABNORMAL HIGH (ref 70–99)

## 2020-06-27 LAB — HEPATIC FUNCTION PANEL
ALT: 8 U/L (ref 0–44)
AST: 13 U/L — ABNORMAL LOW (ref 15–41)
Albumin: 2.3 g/dL — ABNORMAL LOW (ref 3.5–5.0)
Alkaline Phosphatase: 30 U/L — ABNORMAL LOW (ref 38–126)
Bilirubin, Direct: <0.1 mg/dL (ref 0.0–0.2)
Total Bilirubin: 0.5 mg/dL (ref 0.3–1.2)
Total Protein: 4 g/dL — ABNORMAL LOW (ref 6.5–8.1)

## 2020-06-27 LAB — HIV ANTIBODY (ROUTINE TESTING W REFLEX): HIV Screen 4th Generation wRfx: NONREACTIVE

## 2020-06-27 LAB — PREPARE RBC (CROSSMATCH)

## 2020-06-27 LAB — MAGNESIUM: Magnesium: 2 mg/dL (ref 1.7–2.4)

## 2020-06-27 LAB — PHOSPHORUS: Phosphorus: 3.1 mg/dL (ref 2.5–4.6)

## 2020-06-27 MED ORDER — FENTANYL CITRATE (PF) 100 MCG/2ML IJ SOLN: 25.0000 ug | INTRAMUSCULAR | Status: DC | PRN

## 2020-06-27 MED ORDER — INSULIN GLARGINE 100 UNIT/ML ~~LOC~~ SOLN
12.0000 [IU] | Freq: Every day | SUBCUTANEOUS | Status: DC
  Administered 2020-06-27: 12 [IU] via SUBCUTANEOUS
  Filled 2020-06-27 (×2): qty 0.12

## 2020-06-27 MED ORDER — ADULT MULTIVITAMIN W/MINERALS CH
1.0000 | ORAL_TABLET | Freq: Every day | ORAL | Status: DC
  Administered 2020-06-27 – 2020-06-30 (×3): 1 via ORAL
  Filled 2020-06-27 (×4): qty 1

## 2020-06-27 MED ORDER — LORAZEPAM 1 MG PO TABS
1.0000 mg | ORAL_TABLET | ORAL | Status: AC | PRN
  Administered 2020-06-27: 4 mg via ORAL
  Filled 2020-06-27: qty 4
  Filled 2020-06-27: qty 1

## 2020-06-27 MED ORDER — FUROSEMIDE 10 MG/ML IJ SOLN: 20.0000 mg | Freq: Once | INTRAMUSCULAR | Status: DC | PRN

## 2020-06-27 MED ORDER — INSULIN ASPART 100 UNIT/ML ~~LOC~~ SOLN
0.0000 [IU] | SUBCUTANEOUS | Status: DC
  Administered 2020-06-27: 8 [IU] via SUBCUTANEOUS
  Administered 2020-06-27: 3 [IU] via SUBCUTANEOUS
  Administered 2020-06-27: 5 [IU] via SUBCUTANEOUS
  Administered 2020-06-28: 8 [IU] via SUBCUTANEOUS
  Administered 2020-06-28: 3 [IU] via SUBCUTANEOUS
  Administered 2020-06-29 (×2): 2 [IU] via SUBCUTANEOUS
  Administered 2020-06-29 – 2020-06-30 (×4): 3 [IU] via SUBCUTANEOUS

## 2020-06-27 MED ORDER — SODIUM CHLORIDE 0.9% IV SOLUTION: Freq: Once | INTRAVENOUS | Status: AC

## 2020-06-27 MED ORDER — THIAMINE HCL 100 MG/ML IJ SOLN: 100.0000 mg | Freq: Every day | INTRAMUSCULAR | Status: DC

## 2020-06-27 MED ORDER — FOLIC ACID 1 MG PO TABS
1.0000 mg | ORAL_TABLET | Freq: Every day | ORAL | Status: DC
  Administered 2020-06-27 – 2020-06-30 (×3): 1 mg via ORAL
  Filled 2020-06-27 (×4): qty 1

## 2020-06-27 MED ORDER — LORAZEPAM 2 MG/ML IJ SOLN
1.0000 mg | INTRAMUSCULAR | Status: AC | PRN
Start: 2020-06-27 — End: 2020-06-30
  Filled 2020-06-27: qty 2

## 2020-06-27 MED ORDER — THIAMINE HCL 100 MG PO TABS
100.0000 mg | ORAL_TABLET | Freq: Every day | ORAL | Status: DC
  Administered 2020-06-27 – 2020-06-30 (×3): 100 mg via ORAL
  Filled 2020-06-27 (×4): qty 1

## 2020-06-27 NOTE — Plan of Care (Signed)
  Problem: Health Behavior/Discharge Planning: Goal: Ability to manage health-related needs will improve Outcome: Progressing   

## 2020-06-27 NOTE — Consult Note (Addendum)
[facility] Consult: 8:26 AM 06/27/2020  LOS: 1 day    Referring Provider: Dr Lanae Boast  Primary Care Physician:  Patient, No Pcp Per (Inactive) Primary Gastroenterologist:  unassigned    Reason for Consultation: Melena, anemia.   HPI: Jose Morrow is a 55 y.o. male.  History DM2, on metformin.  Hypertension. No prior EGD or colonoscopy.  Chronic right shoulder pain for several months.  Treating pain with 600 to 800 mg of OTC ibuprofen daily.  Admits to drinking a pint of vodka per day about 2 days/week.  ED visit on 4/25, had black stools, Hb 11.5 started on Protonix, blood glucose 349.  Ran out of metformin 2 weeks ago.    Denies abdominal pain, anorexia, nausea, vomiting, other unusual or excessive bleeding besides the black stools.  The stools themselves are black, formed, not tarry.  Has not seen gross blood in his bowel movements.  CBG was high per EMS, measured 500 after bolus of normal saline.  Hgb 6 >> 1 PRBC 6.8.  Platelets, INR normal BUN 53/1.6, was 33/1.3 the day before FOBT positive  Unaware of any family history of ulcer disease, GI bleeds, anemia, colorectal or other types of cancers.  Past Medical History:  Diagnosis Date  . Diabetes mellitus   . Hypertension     History reviewed. No pertinent surgical history.  Prior to Admission medications   Medication Sig Start Date End Date Taking? Authorizing Provider  amLODipine (NORVASC) 10 MG tablet Take 1 tablet (10 mg total) by mouth daily. 11/23/19 11/22/20 Yes Mare Ferrari, PA-C  ibuprofen (ADVIL) 600 MG tablet Take 1 tablet (600 mg total) by mouth every 6 (six) hours as needed. Patient taking differently: Take 600 mg by mouth every 6 (six) hours as needed for mild pain. 05/01/20  Yes Lamptey, Britta Mccreedy, MD  lovastatin (MEVACOR) 20 MG  tablet Take 1 tablet (20 mg total) by mouth at bedtime. 06/23/15  Yes Tomasita Crumble, MD  metFORMIN (GLUCOPHAGE) 500 MG tablet Take 1 tablet (500 mg total) by mouth at bedtime. 12/14/17 12/14/18 Yes Loleta Rose, MD  pantoprazole (PROTONIX) 20 MG tablet Take 1 tablet (20 mg total) by mouth 2 (two) times daily. 06/25/20  Yes Delo, Riley Lam, MD  predniSONE (DELTASONE) 10 MG tablet Take 2 tablets (20 mg total) by mouth 2 (two) times daily with a meal. 06/25/20  Yes Delo, Riley Lam, MD  traMADol (ULTRAM) 50 MG tablet Take 1 tablet (50 mg total) by mouth every 12 (twelve) hours as needed for moderate pain or severe pain. 05/01/20  Yes Lamptey, Britta Mccreedy, MD  triamterene-hydrochlorothiazide (MAXZIDE-25) 37.5-25 MG tablet Take 1 tablet by mouth daily. 05/01/20  Yes Lamptey, Britta Mccreedy, MD  hydrochlorothiazide (HYDRODIURIL) 25 MG tablet Take 1 tablet (25 mg total) by mouth daily. 11/23/19 05/01/20  Mare Ferrari, PA-C    Scheduled Meds: . nicotine  21 mg Transdermal Daily   Infusions: . sodium chloride    . dextrose 5% lactated ringers 125 mL/hr at 06/27/20 0005  . insulin 12 Units/hr (06/27/20 0749)  .  lactated ringers    . pantoprozole (PROTONIX) infusion 8 mg/hr (06/27/20 0454)   PRN Meds: dextrose, fentaNYL (SUBLIMAZE) injection   Allergies as of 06/26/2020 - Review Complete 06/26/2020  Allergen Reaction Noted  . Penicillins Anaphylaxis 11/09/2011    History reviewed. No pertinent family history.  Social History   Socioeconomic History  . Marital status: Legally Separated    Spouse name: Not on file  . Number of children: Not on file  . Years of education: Not on file  . Highest education level: Not on file  Occupational History  . Not on file  Tobacco Use  . Smoking status: Current Every Day Smoker  . Smokeless tobacco: Never Used  Substance and Sexual Activity  . Alcohol use: No  . Drug use: No  . Sexual activity: Not on file  Other Topics Concern  . Not on file  Social History  Narrative  . Not on file   Social Determinants of Health   Financial Resource Strain: Not on file  Food Insecurity: Not on file  Transportation Needs: Not on file  Physical Activity: Not on file  Stress: Not on file  Social Connections: Not on file  Intimate Partner Violence: Not on file    REVIEW OF SYSTEMS: Constitutional: Weakness, fatigue. ENT:  No nose bleeds Pulm: Not short of breath.  No cough. CV:  No palpitations, no LE edema.  No angina. GU:  No hematuria, no frequency GI: See HPI Heme: See HPI Transfusions: No prior transfusion with blood products. Neuro:  No headaches, no peripheral tingling or numbness.  No seizures, no syncope. Derm:  No itching, no rash or sores.  Endocrine:  No sweats or chills.  No polyuria or dysuria Immunization: No vaccines found in review of vaccination history. Travel:  None beyond local counties in last few months.    PHYSICAL EXAM: Vital signs in last 24 hours: Vitals:   06/27/20 0400 06/27/20 0600  BP: (!) 150/86 131/85  Pulse: 70 69  Resp: 20 (!) 21  Temp:    SpO2: 100% 99%   Wt Readings from Last 3 Encounters:  06/26/20 83.5 kg  06/25/20 83.5 kg  05/10/20 83.5 kg    General: Lethargic, somnolent, ill-appearing.  Required numerous aggressive nudges to get him to wake back up to participate in the conversation.  Slightly diaphoretic Head: No facial asymmetry or swelling.  No signs of head trauma. Eyes: Conjunctiva pale.  No scleral icterus. Ears: No obvious hearing deficit Nose: No congestion or discharge Mouth: Edentulous.  Tongue midline.  Mucosa moist, pink, clear. Neck: No masses or JVD. Lungs: Clear bilaterally.  No labored breathing or cough. Heart: RRR.  No MRG.  S1, S2 present. Abdomen: Soft.  Not tender.  Not distended.  No HSM, masses, bruits, hernias appreciated..   Rectal: Deferred.  DRE performed by PA Geiple with notation of black stool, FOBT positive. Musc/Skeltl: No obvious joint deformities,  contractures, swelling. Extremities: No CCE.  Feet are warm Neurologic: Somnolent.  No tremor.  No asterixis.  Moves all 4 limbs.  Oriented to self, place, had trouble remembering the date. Skin: No rash, no sores, no suspicious lesions. Nodes: No cervical adenopathy Psych: Flat affect.  Paucity of speech.  Intake/Output from previous day: 04/26 0701 - 04/27 0700 In: 2670 [IV Piggyback:2670] Out: -  Intake/Output this shift: No intake/output data recorded.  LAB RESULTS: Recent Labs    06/25/20 0103 06/25/20 0455 06/26/20 1730 06/27/20 0630  WBC 7.8  --  8.1  --  HGB 12.5* 11.5* 6.0* 6.8*  HCT 37.0* 33.6* 17.5* 21.2*  PLT 315  --  223  --    BMET Lab Results  Component Value Date   NA 139 06/27/2020   NA 130 (L) 06/26/2020   NA 133 (L) 06/25/2020   K 4.1 06/27/2020   K 3.9 06/26/2020   K 4.2 06/25/2020   CL 110 06/27/2020   CL 101 06/26/2020   CL 96 (L) 06/25/2020   CO2 23 06/27/2020   CO2 24 06/26/2020   CO2 29 06/25/2020   GLUCOSE 174 (H) 06/27/2020   GLUCOSE 523 (HH) 06/26/2020   GLUCOSE 349 (H) 06/25/2020   BUN 45 (H) 06/27/2020   BUN 53 (H) 06/26/2020   BUN 33 (H) 06/25/2020   CREATININE 1.32 (H) 06/27/2020   CREATININE 1.68 (H) 06/26/2020   CREATININE 1.33 (H) 06/25/2020   CALCIUM 7.8 (L) 06/27/2020   CALCIUM 7.7 (L) 06/26/2020   CALCIUM 9.1 06/25/2020   LFT Recent Labs    06/25/20 0103 06/27/20 0407  PROT 6.2* 4.0*  ALBUMIN 3.1* 2.3*  AST 9* 13*  ALT 9 8  ALKPHOS 47 30*  BILITOT 0.5 0.5  BILIDIR  --  <0.1  IBILI  --  NOT CALCULATED   PT/INR Lab Results  Component Value Date   INR 1.2 06/27/2020   INR 1.06 11/09/2009   Hepatitis Panel No results for input(s): HEPBSAG, HCVAB, HEPAIGM, HEPBIGM in the last 72 hours. C-Diff No components found for: CDIFF Lipase  No results found for: LIPASE  Drugs of Abuse     Component Value Date/Time   LABOPIA POSITIVE (A) 11/09/2009 1730   COCAINSCRNUR NONE DETECTED 11/09/2009 1730   LABBENZ  POSITIVE (A) 11/09/2009 1730   AMPHETMU NONE DETECTED 11/09/2009 1730   THCU NONE DETECTED 11/09/2009 1730   LABBARB  11/09/2009 1730    NONE DETECTED        DRUG SCREEN FOR MEDICAL PURPOSES ONLY.  IF CONFIRMATION IS NEEDED FOR ANY PURPOSE, NOTIFY LAB WITHIN 5 DAYS.        LOWEST DETECTABLE LIMITS FOR URINE DRUG SCREEN Drug Class       Cutoff (ng/mL) Amphetamine      1000 Barbiturate      200 Benzodiazepine   200 Tricyclics       300 Opiates          300 Cocaine          300 THC              50     RADIOLOGY STUDIES: DG Chest Port 1 View  Result Date: 06/26/2020 CLINICAL DATA:  Short of breath, malaise EXAM: PORTABLE CHEST 1 VIEW COMPARISON:  12/14/2017 FINDINGS: The heart size and mediastinal contours are within normal limits. Both lungs are clear. The visualized skeletal structures are unremarkable. IMPRESSION: No active disease. Electronically Signed   By: Marlan Palau M.D.   On: 06/26/2020 16:41     IMPRESSION:   *   Black, FOBT positive stool.  Presumed upper GI bleed in setting of NSAID use.  Suspect ulcer.   Protonix drip in place.  *     Acute blood loss anemia.   Hgb 6  >> 1 PRBC >> 6.8 >> transfusing 2nd PRBC.    *    AKI, ?baseline CKD?  *     Poorly controlled DM 2.  Ran out of metformin a few weeks ago.  Hemoglobin A1c 11.1.  *    Heavy alcohol use  periodically, admits to twice a week consumption of 1 pint vodka per day    PLAN:     *   EGD, tmrw at 0900.   Clears today.  Npo after MN.   Sarah Gribbin  06/27/2020, 8:26 AM Phone [phone]  GI ATTENDING  History, laboratories, x-rays reviewed.  Patient seen and examined.  Agree with comprehensive consultation note as outlined above.  The patient presents with hemodynamically stable upper GI bleed with significant acute blood loss anemia.  History of alcohol and NSAID use.  Suspect ulcer disease.  Agree with transfusions for symptomatic anemia.  Agree with IV PPI therapy.  Poorly controlled  diabetes noted.  Plans for upper endoscopy tomorrow.The nature of the procedure, as well as the risks, benefits, and alternatives were carefully and thoroughly reviewed with the patient. Ample time for discussion and questions allowed. The patient understood, was satisfied, and agreed to proceed.  Cainen Burnham N. Joneric Streight, Jr., M.D. [facility] Gastroenterology     

## 2020-06-27 NOTE — ED Notes (Signed)
Went into rm 17 for hourly rounding to find that patient was laying supine on floor after suffering unwitnessed fall. Patient alert, complaining of neck and back pain at this time. Pt v/s stable but appearing cool and clammy with all IV's removed. Pt stated "I was just trying to get to the other side of the room to use the bathroom". Pt was saturated with urine upon assessment. Dr. Dayna Barker notified. Pt placed back in bed and cleaned up without issue.

## 2020-06-27 NOTE — Progress Notes (Addendum)
Inpatient Diabetes Program Recommendations  AACE/ADA: New Consensus Statement on Inpatient Glycemic Control (2015)  Target Ranges:  Prepandial:   less than 140 mg/dL      Peak postprandial:   less than 180 mg/dL (1-2 hours)      Critically ill patients:  140 - 180 mg/dL   Results for Jose Morrow, Jose Morrow (MRN [id number]) as of 06/27/2020 08:54  Ref. Range 06/26/2020 15:10  Glucose Latest Ref Range: 70 - 99 mg/dL [facility])   Results for Jose Morrow, Jose Morrow (MRN [id number]) as of 06/27/2020 08:54  Ref. Range 06/26/2020 14:45 06/26/2020 15:55 06/26/2020 20:59 06/26/2020 22:16 06/26/2020 23:54 06/27/2020 01:00 06/27/2020 02:23 06/27/2020 03:41 06/27/2020 05:14 06/27/2020 06:32 06/27/2020 07:18  Glucose-Capillary Latest Ref Range: 70 - 99 mg/dL 500 (H) 475 (H) 435 (H)  IV Insulin drip Started 9:14pm 350 (H) 180 (H) 115 (H) 156 (H) 163 (H) 200 (H) 181 (H) 290 (H)   Results for Jose Morrow, Jose Morrow (MRN [id number]) as of 06/27/2020 08:54  Ref. Range 06/27/2020 04:10  Hemoglobin A1C Latest Ref Range: 4.8 - 5.6 % 11.1 (H)  (271 mg/dl)    Admit with: Acute upper GI bleed; symptomatic anemia/ Uncontrolled type II DM   History: DM  Home DM Meds: Metformin 500 mg QHS  Current Orders: IV Insulin Drip    Current A1c of 11.1% shows very poor glucose control at home--Will likely need Insulin for home  Plan to see pt today   MD- Note 4am BMET shows the following: Glucose 174 Anion Gap 6 CO2 level 23  When you allow pt to transition to SQ Insulin, please make sure to give basal insulin at least 1 hour prior to d/c of the IV Insulin Drip  Recommend the following: Lantus 20 units Daily (0.25 units/kg) Novolog Moderate Correction Scale/ SSI (0-15 units) TID AC + HS  Of note, pt is Uninsured and does NOT have PCP--TOC consult placed    Addendum 11:15am--Met w/ pt down in the ED.  Sister at bedside and Girlfriend (whom pt lives with) was on the phone.  Reviewed Admission glucose of 523 mg/dl--treatment with IVF and IV  Insulin and transition plan to SQ Insulin.  Also reviewed current A1c of 11.1% (which could be higher given his Hemoglobin level was low on admit).  Sister stated she knows that A1c is too high and told me she knows it needs to be 7%.  Pt was able to tell me he was diagnosed about 5 years ago--Was started on Lantus insulin at time of diagnosis.  Pt told me he stopped the Lantus insulin about 1 year ago--unsure why??  Told me the MD told him to stop the Lantus, however, I do not see any PCP notes in the chart to reflect this.  Used to see Dr. Glendale Chard with [facility].  Per sister, needs to seek care under a new PCP.  Discussed with pt and family that I have placed a TOC consult to get help for pt finding a PCP and also if pt needs help with meds when he d/c's home.  We also discussed the importance of good CBG control to help prevent both short-term and long-term complications and discussed with pt that we will have the RN's begin insulin refresher teaching with him to make sure he can safely administer insulin in case the MD decides to restart his insulin for home.  Pt agreeable.     --Will follow patient during hospitalization--  Wyn Quaker RN, MSN, CDE  Diabetes Coordinator Inpatient Glycemic Control Team Team Pager: [phone] (8a-5p)

## 2020-06-27 NOTE — ED Notes (Signed)
Dr. Dayna Barker at bedside evaluating patient.

## 2020-06-27 NOTE — Progress Notes (Signed)
PROGRESS NOTE    SAAHIL Morrow  FWY:[id number] DOB: [birth year] DOA: 06/26/2020 PCP: Patient, No Pcp Per (Inactive)   Chief Complaint  Patient presents with  . Hyperglycemia    Brief Narrative: 54yom w multiple complex medical issues- type 2 diabetes mellitus and hypertension, now presenting to the emergency department for evaluation of melena, blurred vision, lightheadedness, polyuria, and polydipsia. As per the report Patient suffered a right shoulder injury at work in early March, started on muscle relaxer and ibuprofen, has been taking 4 or 5 tablets of ibuprofen multiple times daily, and then developed heartburn and black stools 3 days ago.  He was seen in the emergency department yesterday, hemoglobin was fairly stable, he was instructed to discontinue NSAID, given Protonix and prednisone, and returned home.  He has continued to have dark stools and has also developed polyuria, polydipsia, lightheadedness, and blurred vision  In the ED vitals stable but tachycardic in 130s blood work showed AKI uncontrolled hyperglycemia with DKA acute blood loss anemia hemoglobin six-point Gramm from 12.5 g FOBT positive GI was consulted placed on PPI IV fluids 2 units PRBC ordered and admitted.  Subjective: Seen patient at the bedside urgently after being notified with the nursing that patient had a fall this morning. Appears mildly sleepy but reports he is tired, has pain in the neck but it was present before fall. Able to move all his extremities well.  No obvious bleeding. Has pain on the right shoulder but had previous injury.   Assessment & Plan:  Acute symptomatic blood loss anemia Presumed GI bleeding/FOBT positive melanotic stool Blood loss anemia due to upper GI in the setting of NSAID and steroid use Getting second unit PRBC.  Check serial H&H.  Continue serial H&H, Protonix GI has been consulted for possible EGD.  NSAIDs discontinued.  N.p.o. for now. Recent Labs  Lab 06/25/20 0103  06/25/20 0455 06/26/20 1730 06/27/20 0630  HGB 12.5* 11.5* 6.0* 6.8*  HCT 37.0* 33.6* 17.5* 21.2*   Poorly controlled diabetes mellitus, ran out of metformin at home.  With normal anion gap placed on insulin drip: Bicarb 23 CBG in 204 I will switch him to Lantus plus every 4 hours sliding scale insulin-while n.p.o. and adjust insulin accordingly.  HbA1c poorly controlled 11.1-likely need insulin. Recent Labs  Lab 06/27/20 0514 06/27/20 0632 06/27/20 0718 06/27/20 0854 06/27/20 1002  GLUCAP 200* 181* 290* 261* 204*   Heavy alcohol use periodically: We will add on CIWA scale Ativan, thiamine folate.  Watch for signs of withdrawal.Per sister he drinks every now and then but does not drink everyday-once a wk.He lives with sister, mostly outside but does get shaky at time.  Unwitnessed fall in the ED CT head CT C-spine no acute finding follow-up x-ray thoracic lumbar spine.  Monitor closely this with the nursing staff keep on fall precaution  AKI in the setting of NSAID use/uncontrolled diabetes.  Creatinine downtrending 1.3.  Continue IV fluid hydration. Recent Labs  Lab 06/25/20 0103 06/26/20 1510 06/27/20 0407  BUN 33* 53* 45*  CREATININE 1.33* 1.68* 1.32*   Hypertension: BP controlled this morning 97/71.  Continue to hold meds.  Overweight with BMI 27.8.  Will benefit with weight loss.  Diet Order            Diet NPO time specified Except for: Ice Chips  Diet effective now                Patient's Body mass index is 27.18 kg/m.  DVT  prophylaxis: SCDs Start: 06/26/20 2047 Code Status:   Code Status: Full Code Family Communication: plan of care discussed with patient at bedside. I called and discussed with patient's sister updated her about plan of care.  She is planning to visit him later today after work.  Status ZO:XWRUEAVWUis:Inpatient Remains inpatient appropriate because:Ongoing diagnostic testing needed not appropriate for outpatient work up, IV treatments appropriate due to  intensity of illness or inability to take PO and Inpatient level of care appropriate due to severity of illness  Dispo: The patient is from: Home              Anticipated d/c is to: Home              Patient currently is not medically stable to d/c.   Difficult to place patient No Unresulted Labs (From admission, onward)          Start     Ordered   06/27/20 0600  Hemoglobin and hematocrit, blood  Now then every 6 hours,   R (with STAT occurrences)      06/27/20 0559   06/27/20 0500  HIV Antibody (routine testing w rflx)  (HIV Antibody (Routine testing w reflex) panel)  Tomorrow morning,   R        06/26/20 2048   06/27/20 0500  Basic metabolic panel  (Hyperglycemia (not DKA or HHS))  Daily,   STAT      06/26/20 2048   06/26/20 1510  CBC with Differential (PNL)  (Hyperglycemia (not DKA or HHS))  ONCE - STAT,   STAT        06/26/20 1510   06/26/20 1510  Beta-hydroxybutyric acid  (Hyperglycemia (not DKA or HHS))  ONCE - STAT,   STAT        06/26/20 1510          Medications reviewed:  Scheduled Meds: . nicotine  21 mg Transdermal Daily   Continuous Infusions: . sodium chloride    . dextrose 5% lactated ringers 125 mL/hr at 06/27/20 0005  . insulin 12 Units/hr (06/27/20 0749)  . lactated ringers    . pantoprozole (PROTONIX) infusion 8 mg/hr (06/27/20 0454)    Consultants:see note  Procedures:see note  Antimicrobials: Anti-infectives (From admission, onward)   None     Culture/Microbiology No results found for: SDES, SPECREQUEST, CULT, REPTSTATUS  Other culture-see note  Objective: Vitals: Today's Vitals   06/27/20 0100 06/27/20 0310 06/27/20 0400 06/27/20 0600  BP: 132/83 115/67 (!) 150/86 131/85  Pulse: 75  70 69  Resp: (!) 23 18 20  (!) 21  Temp:      TempSrc:      SpO2: 99%  100% 99%  Weight:      Height:      PainSc:        Intake/Output Summary (Last 24 hours) at 06/27/2020 0800 Last data filed at 06/26/2020 1617 Gross per 24 hour  Intake 2670 ml   Output --  Net 2670 ml   Filed Weights   06/26/20 1608  Weight: 83.5 kg   Weight change:   Intake/Output from previous day: 04/26 0701 - 04/27 0700 In: 2670 [IV Piggyback:2670] Out: -  Intake/Output this shift: No intake/output data recorded. Filed Weights   06/26/20 1608  Weight: 83.5 kg    Examination: General exam: AA and oriented,NAD, weak appearing. HEENT:Oral mucosa moist, Ear/Nose WNL grossly,dentition normal. Respiratory system: bilaterally diminished,no use of accessory muscle, non tender. Cardiovascular system: S1 & S2 +, regular, No JVD. Gastrointestinal  system: Abdomen soft, NT,ND, BS+. Nervous System:Alert, awake, moving extremities and grossly nonfocal Extremities: No edema, distal peripheral pulses palpable.  Skin: No rashes,no icterus. MSK: Normal muscle bulk,tone, power mild tenderness in the right shoulder.  Data Reviewed: I have personally reviewed following labs and imaging studies CBC: Recent Labs  Lab 06/25/20 0103 06/25/20 0455 06/26/20 1730 06/27/20 0630  WBC 7.8  --  8.1  --   NEUTROABS  --   --  7.3  --   HGB 12.5* 11.5* 6.0* 6.8*  HCT 37.0* 33.6* 17.5* 21.2*  MCV 81.7  --  82.5  --   PLT 315  --  223  --    Basic Metabolic Panel: Recent Labs  Lab 06/25/20 0103 06/26/20 1510 06/27/20 0407  NA 133* 130* 139  K 4.2 3.9 4.1  CL 96* 101 110  CO2 29 24 23   GLUCOSE 349* 523* 174*  BUN 33* 53* 45*  CREATININE 1.33* 1.68* 1.32*  CALCIUM 9.1 7.7* 7.8*   GFR: Estimated Creatinine Clearance: 64 mL/min (A) (by C-G formula based on SCr of 1.32 mg/dL (H)). Liver Function Tests: Recent Labs  Lab 06/25/20 0103 06/27/20 0407  AST 9* 13*  ALT 9 8  ALKPHOS 47 30*  BILITOT 0.5 0.5  PROT 6.2* 4.0*  ALBUMIN 3.1* 2.3*   No results for input(s): LIPASE, AMYLASE in the last 168 hours. No results for input(s): AMMONIA in the last 168 hours. Coagulation Profile: Recent Labs  Lab 06/27/20 0407  INR 1.2   Cardiac Enzymes: No results  for input(s): CKTOTAL, CKMB, CKMBINDEX, TROPONINI in the last 168 hours. BNP (last 3 results) No results for input(s): PROBNP in the last 8760 hours. HbA1C: Recent Labs    06/27/20 0410  HGBA1C 11.1*   CBG: Recent Labs  Lab 06/27/20 0223 06/27/20 0341 06/27/20 0514 06/27/20 0632 06/27/20 0718  GLUCAP 156* 163* 200* 181* 290*   Lipid Profile: No results for input(s): CHOL, HDL, LDLCALC, TRIG, CHOLHDL, LDLDIRECT in the last 72 hours. Thyroid Function Tests: No results for input(s): TSH, T4TOTAL, FREET4, T3FREE, THYROIDAB in the last 72 hours. Anemia Panel: No results for input(s): VITAMINB12, FOLATE, FERRITIN, TIBC, IRON, RETICCTPCT in the last 72 hours. Sepsis Labs: No results for input(s): PROCALCITON, LATICACIDVEN in the last 168 hours.  Recent Results (from the past 240 hour(s))  Resp Panel by RT-PCR (Flu A&B, Covid) Nasopharyngeal Swab     Status: None   Collection Time: 06/26/20  8:10 PM   Specimen: Nasopharyngeal Swab; Nasopharyngeal(NP) swabs in vial transport medium  Result Value Ref Range Status   SARS Coronavirus 2 by RT PCR NEGATIVE NEGATIVE Final    Comment: (NOTE) SARS-CoV-2 target nucleic acids are NOT DETECTED.  The SARS-CoV-2 RNA is generally detectable in upper respiratory specimens during the acute phase of infection. The lowest concentration of SARS-CoV-2 viral copies this assay can detect is 138 copies/mL. A negative result does not preclude SARS-Cov-2 infection and should not be used as the sole basis for treatment or other patient management decisions. A negative result may occur with  improper specimen collection/handling, submission of specimen other than nasopharyngeal swab, presence of viral mutation(s) within the areas targeted by this assay, and inadequate number of viral copies(<138 copies/mL). A negative result must be combined with clinical observations, patient history, and epidemiological information. The expected result is  Negative.  Fact Sheet for Patients:  06/28/20  Fact Sheet for Healthcare Providers:  BloggerCourse.com  This test is no t yet approved or cleared by the  [place] [employer] and  has been authorized for detection and/or diagnosis of SARS-CoV-2 by FDA under an TEFL teacher (EUA). This EUA will remain  in effect (meaning this test can be used) for the duration of the COVID-19 declaration under Section 564(b)(1) of the Act, 21 U.S.C.section 360bbb-3(b)(1), unless the authorization is terminated  or revoked sooner.       Influenza A by PCR NEGATIVE NEGATIVE Final   Influenza B by PCR NEGATIVE NEGATIVE Final    Comment: (NOTE) The Xpert Xpress SARS-CoV-2/FLU/RSV plus assay is intended as an aid in the diagnosis of influenza from Nasopharyngeal swab specimens and should not be used as a sole basis for treatment. Nasal washings and aspirates are unacceptable for Xpert Xpress SARS-CoV-2/FLU/RSV testing.  Fact Sheet for Patients: BloggerCourse.com  Fact Sheet for Healthcare Providers: SeriousBroker.it  This test is not yet approved or cleared by the [place] FDA and has been authorized for detection and/or diagnosis of SARS-CoV-2 by FDA under an Emergency Use Authorization (EUA). This EUA will remain in effect (meaning this test can be used) for the duration of the COVID-19 declaration under Section 564(b)(1) of the Act, 21 U.S.C. section 360bbb-3(b)(1), unless the authorization is terminated or revoked.  Performed at [facility], [address] [place], [place] [address]      Radiology Studies: DG Chest Port 1 View  Result Date: 06/26/2020 CLINICAL DATA:  Short of breath, malaise EXAM: PORTABLE CHEST 1 VIEW COMPARISON:  12/14/2017 FINDINGS: The heart size and mediastinal contours are within normal limits. Both lungs are clear. The visualized  skeletal structures are unremarkable. IMPRESSION: No active disease. Electronically Signed   By: Marlan Palau M.D.   On: 06/26/2020 16:41     LOS: 1 day   Lanae Boast, MD [facility]  06/27/2020, 8:00 AM

## 2020-06-27 NOTE — H&P (View-Only) (Signed)
[facility] Consult: 8:26 AM 06/27/2020  LOS: 1 day    Referring Provider: Dr Lanae Boast  Primary Care Physician:  Patient, No Pcp Per (Inactive) Primary Gastroenterologist:  unassigned    Reason for Consultation: Melena, anemia.   HPI: Jose Morrow is a 55 y.o. male.  History DM2, on metformin.  Hypertension. No prior EGD or colonoscopy.  Chronic right shoulder pain for several months.  Treating pain with 600 to 800 mg of OTC ibuprofen daily.  Admits to drinking a pint of vodka per day about 2 days/week.  ED visit on 4/25, had black stools, Hb 11.5 started on Protonix, blood glucose 349.  Ran out of metformin 2 weeks ago.    Denies abdominal pain, anorexia, nausea, vomiting, other unusual or excessive bleeding besides the black stools.  The stools themselves are black, formed, not tarry.  Has not seen gross blood in his bowel movements.  CBG was high per EMS, measured 500 after bolus of normal saline.  Hgb 6 >> 1 PRBC 6.8.  Platelets, INR normal BUN 53/1.6, was 33/1.3 the day before FOBT positive  Unaware of any family history of ulcer disease, GI bleeds, anemia, colorectal or other types of cancers.  Past Medical History:  Diagnosis Date  . Diabetes mellitus   . Hypertension     History reviewed. No pertinent surgical history.  Prior to Admission medications   Medication Sig Start Date End Date Taking? Authorizing Provider  amLODipine (NORVASC) 10 MG tablet Take 1 tablet (10 mg total) by mouth daily. 11/23/19 11/22/20 Yes Mare Ferrari, PA-C  ibuprofen (ADVIL) 600 MG tablet Take 1 tablet (600 mg total) by mouth every 6 (six) hours as needed. Patient taking differently: Take 600 mg by mouth every 6 (six) hours as needed for mild pain. 05/01/20  Yes Lamptey, Britta Mccreedy, MD  lovastatin (MEVACOR) 20 MG  tablet Take 1 tablet (20 mg total) by mouth at bedtime. 06/23/15  Yes Tomasita Crumble, MD  metFORMIN (GLUCOPHAGE) 500 MG tablet Take 1 tablet (500 mg total) by mouth at bedtime. 12/14/17 12/14/18 Yes Loleta Rose, MD  pantoprazole (PROTONIX) 20 MG tablet Take 1 tablet (20 mg total) by mouth 2 (two) times daily. 06/25/20  Yes Delo, Riley Lam, MD  predniSONE (DELTASONE) 10 MG tablet Take 2 tablets (20 mg total) by mouth 2 (two) times daily with a meal. 06/25/20  Yes Delo, Riley Lam, MD  traMADol (ULTRAM) 50 MG tablet Take 1 tablet (50 mg total) by mouth every 12 (twelve) hours as needed for moderate pain or severe pain. 05/01/20  Yes Lamptey, Britta Mccreedy, MD  triamterene-hydrochlorothiazide (MAXZIDE-25) 37.5-25 MG tablet Take 1 tablet by mouth daily. 05/01/20  Yes Lamptey, Britta Mccreedy, MD  hydrochlorothiazide (HYDRODIURIL) 25 MG tablet Take 1 tablet (25 mg total) by mouth daily. 11/23/19 05/01/20  Mare Ferrari, PA-C    Scheduled Meds: . nicotine  21 mg Transdermal Daily   Infusions: . sodium chloride    . dextrose 5% lactated ringers 125 mL/hr at 06/27/20 0005  . insulin 12 Units/hr (06/27/20 0749)  .  lactated ringers    . pantoprozole (PROTONIX) infusion 8 mg/hr (06/27/20 0454)   PRN Meds: dextrose, fentaNYL (SUBLIMAZE) injection   Allergies as of 06/26/2020 - Review Complete 06/26/2020  Allergen Reaction Noted  . Penicillins Anaphylaxis 11/09/2011    History reviewed. No pertinent family history.  Social History   Socioeconomic History  . Marital status: Legally Separated    Spouse name: Not on file  . Number of children: Not on file  . Years of education: Not on file  . Highest education level: Not on file  Occupational History  . Not on file  Tobacco Use  . Smoking status: Current Every Day Smoker  . Smokeless tobacco: Never Used  Substance and Sexual Activity  . Alcohol use: No  . Drug use: No  . Sexual activity: Not on file  Other Topics Concern  . Not on file  Social History  Narrative  . Not on file   Social Determinants of Health   Financial Resource Strain: Not on file  Food Insecurity: Not on file  Transportation Needs: Not on file  Physical Activity: Not on file  Stress: Not on file  Social Connections: Not on file  Intimate Partner Violence: Not on file    REVIEW OF SYSTEMS: Constitutional: Weakness, fatigue. ENT:  No nose bleeds Pulm: Not short of breath.  No cough. CV:  No palpitations, no LE edema.  No angina. GU:  No hematuria, no frequency GI: See HPI Heme: See HPI Transfusions: No prior transfusion with blood products. Neuro:  No headaches, no peripheral tingling or numbness.  No seizures, no syncope. Derm:  No itching, no rash or sores.  Endocrine:  No sweats or chills.  No polyuria or dysuria Immunization: No vaccines found in review of vaccination history. Travel:  None beyond local counties in last few months.    PHYSICAL EXAM: Vital signs in last 24 hours: Vitals:   06/27/20 0400 06/27/20 0600  BP: (!) 150/86 131/85  Pulse: 70 69  Resp: 20 (!) 21  Temp:    SpO2: 100% 99%   Wt Readings from Last 3 Encounters:  06/26/20 83.5 kg  06/25/20 83.5 kg  05/10/20 83.5 kg    General: Lethargic, somnolent, ill-appearing.  Required numerous aggressive nudges to get him to wake back up to participate in the conversation.  Slightly diaphoretic Head: No facial asymmetry or swelling.  No signs of head trauma. Eyes: Conjunctiva pale.  No scleral icterus. Ears: No obvious hearing deficit Nose: No congestion or discharge Mouth: Edentulous.  Tongue midline.  Mucosa moist, pink, clear. Neck: No masses or JVD. Lungs: Clear bilaterally.  No labored breathing or cough. Heart: RRR.  No MRG.  S1, S2 present. Abdomen: Soft.  Not tender.  Not distended.  No HSM, masses, bruits, hernias appreciated..   Rectal: Deferred.  DRE performed by PA Geiple with notation of black stool, FOBT positive. Musc/Skeltl: No obvious joint deformities,  contractures, swelling. Extremities: No CCE.  Feet are warm Neurologic: Somnolent.  No tremor.  No asterixis.  Moves all 4 limbs.  Oriented to self, place, had trouble remembering the date. Skin: No rash, no sores, no suspicious lesions. Nodes: No cervical adenopathy Psych: Flat affect.  Paucity of speech.  Intake/Output from previous day: 04/26 0701 - 04/27 0700 In: 2670 [IV Piggyback:2670] Out: -  Intake/Output this shift: No intake/output data recorded.  LAB RESULTS: Recent Labs    06/25/20 0103 06/25/20 0455 06/26/20 1730 06/27/20 0630  WBC 7.8  --  8.1  --  HGB 12.5* 11.5* 6.0* 6.8*  HCT 37.0* 33.6* 17.5* 21.2*  PLT 315  --  223  --    BMET Lab Results  Component Value Date   NA 139 06/27/2020   NA 130 (L) 06/26/2020   NA 133 (L) 06/25/2020   K 4.1 06/27/2020   K 3.9 06/26/2020   K 4.2 06/25/2020   CL 110 06/27/2020   CL 101 06/26/2020   CL 96 (L) 06/25/2020   CO2 23 06/27/2020   CO2 24 06/26/2020   CO2 29 06/25/2020   GLUCOSE 174 (H) 06/27/2020   GLUCOSE 523 (HH) 06/26/2020   GLUCOSE 349 (H) 06/25/2020   BUN 45 (H) 06/27/2020   BUN 53 (H) 06/26/2020   BUN 33 (H) 06/25/2020   CREATININE 1.32 (H) 06/27/2020   CREATININE 1.68 (H) 06/26/2020   CREATININE 1.33 (H) 06/25/2020   CALCIUM 7.8 (L) 06/27/2020   CALCIUM 7.7 (L) 06/26/2020   CALCIUM 9.1 06/25/2020   LFT Recent Labs    06/25/20 0103 06/27/20 0407  PROT 6.2* 4.0*  ALBUMIN 3.1* 2.3*  AST 9* 13*  ALT 9 8  ALKPHOS 47 30*  BILITOT 0.5 0.5  BILIDIR  --  <0.1  IBILI  --  NOT CALCULATED   PT/INR Lab Results  Component Value Date   INR 1.2 06/27/2020   INR 1.06 11/09/2009   Hepatitis Panel No results for input(s): HEPBSAG, HCVAB, HEPAIGM, HEPBIGM in the last 72 hours. C-Diff No components found for: CDIFF Lipase  No results found for: LIPASE  Drugs of Abuse     Component Value Date/Time   LABOPIA POSITIVE (A) 11/09/2009 1730   COCAINSCRNUR NONE DETECTED 11/09/2009 1730   LABBENZ  POSITIVE (A) 11/09/2009 1730   AMPHETMU NONE DETECTED 11/09/2009 1730   THCU NONE DETECTED 11/09/2009 1730   LABBARB  11/09/2009 1730    NONE DETECTED        DRUG SCREEN FOR MEDICAL PURPOSES ONLY.  IF CONFIRMATION IS NEEDED FOR ANY PURPOSE, NOTIFY LAB WITHIN 5 DAYS.        LOWEST DETECTABLE LIMITS FOR URINE DRUG SCREEN Drug Class       Cutoff (ng/mL) Amphetamine      1000 Barbiturate      200 Benzodiazepine   200 Tricyclics       300 Opiates          300 Cocaine          300 THC              50     RADIOLOGY STUDIES: DG Chest Port 1 View  Result Date: 06/26/2020 CLINICAL DATA:  Short of breath, malaise EXAM: PORTABLE CHEST 1 VIEW COMPARISON:  12/14/2017 FINDINGS: The heart size and mediastinal contours are within normal limits. Both lungs are clear. The visualized skeletal structures are unremarkable. IMPRESSION: No active disease. Electronically Signed   By: Marlan Palau M.D.   On: 06/26/2020 16:41     IMPRESSION:   *   Black, FOBT positive stool.  Presumed upper GI bleed in setting of NSAID use.  Suspect ulcer.   Protonix drip in place.  *     Acute blood loss anemia.   Hgb 6  >> 1 PRBC >> 6.8 >> transfusing 2nd PRBC.    *    AKI, ?baseline CKD?  *     Poorly controlled DM 2.  Ran out of metformin a few weeks ago.  Hemoglobin A1c 11.1.  *    Heavy alcohol use  periodically, admits to twice a week consumption of 1 pint vodka per day    PLAN:     *   EGD, tmrw at 0900.   Clears today.  Npo after MN.   Jennye MoccasinSarah Gribbin  06/27/2020, 8:26 AM Phone [phone]  GI ATTENDING  History, laboratories, x-rays reviewed.  Patient seen and examined.  Agree with comprehensive consultation note as outlined above.  The patient presents with hemodynamically stable upper GI bleed with significant acute blood loss anemia.  History of alcohol and NSAID use.  Suspect ulcer disease.  Agree with transfusions for symptomatic anemia.  Agree with IV PPI therapy.  Poorly controlled  diabetes noted.  Plans for upper endoscopy tomorrow.The nature of the procedure, as well as the risks, benefits, and alternatives were carefully and thoroughly reviewed with the patient. Ample time for discussion and questions allowed. The patient understood, was satisfied, and agreed to proceed.  Wilhemina BonitoJohn N. Eda KeysPerry, Jr., M.D. [facility]

## 2020-06-28 ENCOUNTER — Inpatient Hospital Stay (HOSPITAL_COMMUNITY): Payer: Self-pay | Admitting: Anesthesiology

## 2020-06-28 ENCOUNTER — Encounter (HOSPITAL_COMMUNITY): Payer: Self-pay | Admitting: Family Medicine

## 2020-06-28 ENCOUNTER — Encounter (HOSPITAL_COMMUNITY)
Admission: EM | Disposition: A | Discharge status: Home / Self Care | Payer: Self-pay | Source: Home / Self Care | Attending: Internal Medicine

## 2020-06-28 DIAGNOSIS — K259 Gastric ulcer, unspecified as acute or chronic, without hemorrhage or perforation: Secondary | ICD-10-CM

## 2020-06-28 DIAGNOSIS — K250 Acute gastric ulcer with hemorrhage: Secondary | ICD-10-CM

## 2020-06-28 DIAGNOSIS — K269 Duodenal ulcer, unspecified as acute or chronic, without hemorrhage or perforation: Secondary | ICD-10-CM

## 2020-06-28 HISTORY — PX: ESOPHAGOGASTRODUODENOSCOPY (EGD) WITH PROPOFOL: SHX5813

## 2020-06-28 HISTORY — PX: BIOPSY: SHX5522

## 2020-06-28 LAB — BASIC METABOLIC PANEL
Anion gap: 6 (ref 5–15)
BUN: 29 mg/dL — ABNORMAL HIGH (ref 6–20)
CO2: 25 mmol/L (ref 22–32)
Calcium: 7.6 mg/dL — ABNORMAL LOW (ref 8.9–10.3)
Chloride: 109 mmol/L (ref 98–111)
Creatinine, Ser: 1.23 mg/dL (ref 0.61–1.24)
GFR, Estimated: >60 mL/min (ref >60)
Glucose, Bld: 131 mg/dL — ABNORMAL HIGH (ref 70–99)
Potassium: 3.7 mmol/L (ref 3.5–5.1)
Sodium: 140 mmol/L (ref 135–145)

## 2020-06-28 LAB — HEMOGLOBIN AND HEMATOCRIT, BLOOD
HCT: 22.8 % — ABNORMAL LOW (ref 39.0–52.0)
HCT: 22.4 % — ABNORMAL LOW (ref 39.0–52.0)
Hemoglobin: 8.1 g/dL — ABNORMAL LOW (ref 13.0–17.0)
Hemoglobin: 7.7 g/dL — ABNORMAL LOW (ref 13.0–17.0)

## 2020-06-28 LAB — GLUCOSE, CAPILLARY
Glucose-Capillary: 114 mg/dL — ABNORMAL HIGH (ref 70–99)
Glucose-Capillary: 98 mg/dL (ref 70–99)
Glucose-Capillary: 142 mg/dL — ABNORMAL HIGH (ref 70–99)
Glucose-Capillary: 153 mg/dL — ABNORMAL HIGH (ref 70–99)
Glucose-Capillary: 118 mg/dL — ABNORMAL HIGH (ref 70–99)
Glucose-Capillary: 94 mg/dL (ref 70–99)
Glucose-Capillary: 260 mg/dL — ABNORMAL HIGH (ref 70–99)

## 2020-06-28 SURGERY — ESOPHAGOGASTRODUODENOSCOPY (EGD) WITH PROPOFOL
Anesthesia: Monitor Anesthesia Care

## 2020-06-28 MED ORDER — LACTATED RINGERS IV SOLN: INTRAVENOUS | Status: DC | PRN

## 2020-06-28 MED ORDER — PROPOFOL 500 MG/50ML IV EMUL
INTRAVENOUS | Status: DC | PRN
  Administered 2020-06-28: 75 ug/kg/min via INTRAVENOUS

## 2020-06-28 MED ORDER — INSULIN GLARGINE 100 UNIT/ML ~~LOC~~ SOLN
5.0000 [IU] | Freq: Every day | SUBCUTANEOUS | Status: DC
  Administered 2020-06-29: 5 [IU] via SUBCUTANEOUS
  Filled 2020-06-28 (×2): qty 0.05

## 2020-06-28 MED ORDER — LIDOCAINE 2% (20 MG/ML) 5 ML SYRINGE
INTRAMUSCULAR | Status: DC | PRN
  Administered 2020-06-28: 60 mg via INTRAVENOUS

## 2020-06-28 MED ORDER — PROPOFOL 10 MG/ML IV BOLUS
INTRAVENOUS | Status: DC | PRN
  Administered 2020-06-28: 20 mg via INTRAVENOUS

## 2020-06-28 SURGICAL SUPPLY — 15 items

## 2020-06-28 NOTE — Anesthesia Procedure Notes (Signed)
Procedure Name: MAC Date/Time: 06/28/2020 1:12 PM Performed by: Trinna Post., CRNA Pre-anesthesia Checklist: Patient identified, Emergency Drugs available, Suction available, Patient being monitored and Timeout performed Patient Re-evaluated:Patient Re-evaluated prior to induction Oxygen Delivery Method: Nasal cannula Preoxygenation: Pre-oxygenation with 100% oxygen Induction Type: IV induction Placement Confirmation: positive ETCO2

## 2020-06-28 NOTE — Anesthesia Preprocedure Evaluation (Addendum)
Anesthesia Evaluation  Patient identified by MRN, date of birth, ID band Patient confused    Reviewed: Allergy & Precautions, NPO status , Patient's Chart, lab work & pertinent test results  Airway Mallampati: III  TM Distance: >3 FB Neck ROM: Full    Dental  (+) Edentulous Upper, Dental Advisory Given, Poor Dentition   Pulmonary Current Smoker,    Pulmonary exam normal breath sounds clear to auscultation       Cardiovascular hypertension, Pt. on medications Normal cardiovascular exam Rhythm:Regular Rate:Normal     Neuro/Psych negative neurological ROS  negative psych ROS   GI/Hepatic (+)     substance abuse (etOH abuse)  alcohol use, Pint of vodka per day about 2 days/week. GIB 2/2 chronic NSAID use   Endo/Other  diabetes  Renal/GU Renal InsufficiencyRenal diseaseCr 1.32  negative genitourinary   Musculoskeletal negative musculoskeletal ROS (+)   Abdominal   Peds  Hematology  (+) Blood dyscrasia, anemia , H/H 8/22   Anesthesia Other Findings   Reproductive/Obstetrics negative OB ROS                           Anesthesia Physical Anesthesia Plan  ASA: IV  Anesthesia Plan: MAC   Post-op Pain Management:    Induction:   PONV Risk Score and Plan: 2 and Propofol infusion and TIVA  Airway Management Planned: Natural Airway and Simple Face Mask  Additional Equipment: None  Intra-op Plan:   Post-operative Plan:   Informed Consent: I have reviewed the patients History and Physical, chart, labs and discussed the procedure including the risks, benefits and alternatives for the proposed anesthesia with the patient or authorized representative who has indicated his/her understanding and acceptance.       Plan Discussed with: CRNA  Anesthesia Plan Comments: (Hb 8.1 after pRBCs D/w sister on phone consent for anesthesia Pt very sedated, confused in preop)       Anesthesia  Quick Evaluation

## 2020-06-28 NOTE — Progress Notes (Signed)
PROGRESS NOTE    MALCOME AMBROCIO  XBJ:[id number] DOB: Nov 04, 1965 DOA: 06/26/2020 PCP: Patient, No Pcp Per (Inactive)   Chief Complaint  Patient presents with  . Hyperglycemia    Brief Narrative: 55yom w multiple complex medical issues- type 2 diabetes mellitus and hypertension, now presenting to the emergency department for evaluation of melena, blurred vision, lightheadedness, polyuria, and polydipsia. As per the report Patient suffered a right shoulder injury at work in early March, started on muscle relaxer and ibuprofen, has been taking 4 or 5 tablets of ibuprofen multiple times daily, and then developed heartburn and black stools 3 days ago.  He was seen in the emergency department yesterday, hemoglobin was fairly stable, he was instructed to discontinue NSAID, given Protonix and prednisone, and returned home.  He has continued to have dark stools and has also developed polyuria, polydipsia, lightheadedness, and blurred vision In the ED vitals stable but tachycardic in 130s blood work showed AKI uncontrolled hyperglycemia with DKA acute blood loss anemia hemoglobin six-point Gramm from 12.5 g FOBT positive GI was consulted placed on PPI IV fluids 2 units PRBC ordered and admitted.  Subjective: Seen this morning girlfriend at the bedside.   Patient sleepy able to wake up told me his name answer few questions, but sleepy again.  Overnight received Ativan. Additional 2 units PRBC completed overnight.   Assessment & Plan:  Acute severe symptomatic blood loss anemia Presumed GI bleeding/FOBT positive melanotic stool S/p 4 unit PRBC H&H this morning improved 8.1.   Blood loss anemia likely due to upper GI in the setting of NSAID and steroid use GI on board for EGD, continue Protonix continue serial H&H monitoring. Recent Labs  Lab 06/26/20 1730 06/27/20 0630 06/27/20 1551 06/27/20 2216 06/28/20 0832  HGB 6.0* 6.8* 6.1* 5.5* 8.1*  HCT 17.5* 21.2* 17.7* 15.8* 22.8*   Poorly controlled  diabetes mellitus, ran out of metformin at home.  With normal anion gap placed on insulin drip:Switched to Lantus plus every 4 hours sliding scale insulin-this morning blood sugar downtrending nursing following Lantus dose has been decreased, continue sliding scale, HbA1c poorly controlled 11.1-will need insulin on discharge. Recent Labs  Lab 06/27/20 1621 06/27/20 1958 06/27/20 2313 06/28/20 0312 06/28/20 0753  GLUCAP 200* 261* 220* 114* 98   Heavy alcohol use periodically: added on CIWA scale Ativan, thiamine folate.  Monitor for signs of withdrawal.Per sister he drinks every now and then but does not drink everyday-once a wk?[name]  Overnight received Ativan.  This morning nursing holding Ativan for lethargic.  Monitor  Unwitnessed fall in the ED 4/27 am- CT head CT C-spine and x-ray thoracic spine and lumbar spine and right shoulder no acute finding.  Continue fall precaution supportive care.   AKI in the setting of NSAID use/uncontrolled diabetes.  Resolved with fluids. Recent Labs  Lab 06/25/20 0103 06/26/20 1510 06/27/20 0407 06/28/20 0832  BUN 33* 53* 45* 29*  CREATININE 1.33* 1.68* 1.32* 1.23   Hypertension: BP trending up but fluctuating continue to hold hold meds.  Overweight with BMI 27.8.  Will benefit with weight loss.  Diet Order            Diet NPO time specified  Diet effective midnight                Patient's Body mass index is 27.18 kg/m.  DVT prophylaxis: SCDs Start: 06/26/20 2047 Code Status:   Code Status: Full Code Family Communication: plan of care discussed with patient at bedside. I called  and discussed with patient's sister  4/27, girlfriend updated at the bedside.    Status GN:[id number] Remains inpatient appropriate because:Ongoing diagnostic testing needed not appropriate for outpatient work up, IV treatments appropriate due to intensity of illness or inability to take PO and Inpatient level of care appropriate due to severity of illness  Dispo:  The patient is from: Home              Anticipated d/c is to: Home              Patient currently is not medically stable to d/c.   Difficult to place patient No Unresulted Labs (From admission, onward)          Start     Ordered   06/27/20 1400  Hemoglobin and hematocrit, blood  Now then every 8 hours,   R (with TIMED occurrences)      06/27/20 1040   06/27/20 0500  Basic metabolic panel  (Hyperglycemia (not DKA or HHS))  Daily,   STAT      06/26/20 2048   06/26/20 1510  CBC with Differential (PNL)  (Hyperglycemia (not DKA or HHS))  ONCE - STAT,   STAT        06/26/20 1510          Medications reviewed:  Scheduled Meds: . folic acid  1 mg Oral Daily  . insulin aspart  0-15 Units Subcutaneous Q4H  . insulin glargine  5 Units Subcutaneous Daily  . multivitamin with minerals  1 tablet Oral Daily  . nicotine  21 mg Transdermal Daily  . thiamine  100 mg Oral Daily   Continuous Infusions: . sodium chloride    . insulin Stopped (06/27/20 1416)  . lactated ringers 100 mL/hr at 06/28/20 0825  . pantoprozole (PROTONIX) infusion 8 mg/hr (06/28/20 1015)    Consultants:see note  Procedures:see note  Antimicrobials: Anti-infectives (From admission, onward)   None     Culture/Microbiology No results found for: SDES, SPECREQUEST, CULT, REPTSTATUS  Other culture-see note  Objective: Vitals: Today's Vitals   06/28/20 0542 06/28/20 0749 06/28/20 0800 06/28/20 0821  BP: (!) 145/78 (!) 160/80 (!) 162/85   Pulse: 82 85 86   Resp: 20 20    Temp: 98.1 F (36.7 C) 98 F (36.7 C)    TempSrc: Axillary Oral    SpO2: 99% 99% 91%   Weight:      Height:      PainSc:    0-No pain    Intake/Output Summary (Last 24 hours) at 06/28/2020 1027 Last data filed at 06/28/2020 1015 Gross per 24 hour  Intake 2860.14 ml  Output 250 ml  Net 2610.14 ml   Filed Weights   06/26/20 1608  Weight: 83.5 kg   Weight change:   Intake/Output from previous day: 04/27 0701 - 04/28 0700 In: 2108.2  [I.V.:850.2; Blood:1258] Out: 250 [Urine:250] Intake/Output this shift: Total I/O In: 1055.9 [I.V.:1055.9] Out: -  Filed Weights   06/26/20 1608  Weight: 83.5 kg    Examination: General exam: AAOx self, place, people, but goes back to sleep easily 55 than stated age, weak appearing. HEENT:Oral mucosa moist, Ear/Nose WNL grossly, dentition normal. Respiratory system: bilaterally diminished, no use of accessory muscle Cardiovascular system: S1 & S2 +, No JVD,. Gastrointestinal system: Abdomen soft,obese NT,ND, BS+ Nervous System:Alert, awake, moving extremities and grossly nonfocal Extremities: no edema, distal peripheral pulses palpable.  Skin: No rashes,no icterus. MSK: Normal muscle bulk,tone, power    Data Reviewed: I have personally  reviewed following labs and imaging studies CBC: Recent Labs  Lab 06/25/20 0103 06/25/20 0455 06/26/20 1730 06/27/20 0630 06/27/20 1551 06/27/20 2216 06/28/20 0832  WBC 7.8  --  8.1  --   --   --   --   NEUTROABS  --   --  7.3  --   --   --   --   HGB 12.5*   < > 6.0* 6.8* 6.1* 5.5* 8.1*  HCT 37.0*   < > 17.5* 21.2* 17.7* 15.8* 22.8*  MCV 81.7  --  82.5  --   --   --   --   PLT 315  --  223  --   --   --   --    < > = values in this interval not displayed.   Basic Metabolic Panel: Recent Labs  Lab 06/25/20 0103 06/26/20 1510 06/27/20 0407 06/28/20 0832  NA 133* 130* 139 140  K 4.2 3.9 4.1 3.7  CL 96* 101 110 109  CO2 29 24 23 25   GLUCOSE 349* 523* 174* 131*  BUN 33* 53* 45* 29*  CREATININE 1.33* 1.68* 1.32* 1.23  CALCIUM 9.1 7.7* 7.8* 7.6*  MG  --   --  2.0  --   PHOS  --   --  3.1  --    GFR: Estimated Creatinine Clearance: 68.7 mL/min (by C-G formula based on SCr of 1.23 mg/dL). Liver Function Tests: Recent Labs  Lab 06/25/20 0103 06/27/20 0407  AST 9* 13*  ALT 9 8  ALKPHOS 47 30*  BILITOT 0.5 0.5  PROT 6.2* 4.0*  ALBUMIN 3.1* 2.3*   No results for input(s): LIPASE, AMYLASE in the last 168 hours. No  results for input(s): AMMONIA in the last 168 hours. Coagulation Profile: Recent Labs  Lab 06/27/20 0407  INR 1.2   Cardiac Enzymes: No results for input(s): CKTOTAL, CKMB, CKMBINDEX, TROPONINI in the last 168 hours. BNP (last 3 results) No results for input(s): PROBNP in the last 8760 hours. HbA1C: Recent Labs    06/27/20 0410  HGBA1C 11.1*   CBG: Recent Labs  Lab 06/27/20 1621 06/27/20 1958 06/27/20 2313 06/28/20 0312 06/28/20 0753  GLUCAP 200* 261* 220* 114* 98   Lipid Profile: No results for input(s): CHOL, HDL, LDLCALC, TRIG, CHOLHDL, LDLDIRECT in the last 72 hours. Thyroid Function Tests: No results for input(s): TSH, T4TOTAL, FREET4, T3FREE, THYROIDAB in the last 72 hours. Anemia Panel: No results for input(s): VITAMINB12, FOLATE, FERRITIN, TIBC, IRON, RETICCTPCT in the last 72 hours. Sepsis Labs: No results for input(s): PROCALCITON, LATICACIDVEN in the last 168 hours.  Recent Results (from the past 240 hour(s))  Resp Panel by RT-PCR (Flu A&B, Covid) Nasopharyngeal Swab     Status: None   Collection Time: 06/26/20  8:10 PM   Specimen: Nasopharyngeal Swab; Nasopharyngeal(NP) swabs in vial transport medium  Result Value Ref Range Status   SARS Coronavirus 2 by RT PCR NEGATIVE NEGATIVE Final    Comment: (NOTE) SARS-CoV-2 target nucleic acids are NOT DETECTED.  The SARS-CoV-2 RNA is generally detectable in upper respiratory specimens during the acute phase of infection. The lowest concentration of SARS-CoV-2 viral copies this assay can detect is 138 copies/mL. A negative result does not preclude SARS-Cov-2 infection and should not be used as the sole basis for treatment or other patient management decisions. A negative result may occur with  improper specimen collection/handling, submission of specimen other than nasopharyngeal swab, presence of viral mutation(s) within the areas targeted by this  assay, and inadequate number of viral copies(<138 copies/mL). A  negative result must be combined with clinical observations, patient history, and epidemiological information. The expected result is Negative.  Fact Sheet for Patients:  BloggerCourse.com  Fact Sheet for Healthcare Providers:  SeriousBroker.it  This test is no t yet approved or cleared by the [place] FDA and  has been authorized for detection and/or diagnosis of SARS-CoV-2 by FDA under an Emergency Use Authorization (EUA). This EUA will remain  in effect (meaning this test can be used) for the duration of the COVID-19 declaration under Section 564(b)(1) of the Act, 21 U.S.C.section 360bbb-3(b)(1), unless the authorization is terminated  or revoked sooner.       Influenza A by PCR NEGATIVE NEGATIVE Final   Influenza B by PCR NEGATIVE NEGATIVE Final    Comment: (NOTE) The Xpert Xpress SARS-CoV-2/FLU/RSV plus assay is intended as an aid in the diagnosis of influenza from Nasopharyngeal swab specimens and should not be used as a sole basis for treatment. Nasal washings and aspirates are unacceptable for Xpert Xpress SARS-CoV-2/FLU/RSV testing.  Fact Sheet for Patients: BloggerCourse.com  Fact Sheet for Healthcare Providers: SeriousBroker.it  This test is not yet approved or cleared by the [place] FDA and has been authorized for detection and/or diagnosis of SARS-CoV-2 by FDA under an Emergency Use Authorization (EUA). This EUA will remain in effect (meaning this test can be used) for the duration of the COVID-19 declaration under Section 564(b)(1) of the Act, 21 U.S.C. section 360bbb-3(b)(1), unless the authorization is terminated or revoked.  Performed at [facility], [address] [place], [place] [address]      Radiology Studies: DG Thoracic Spine 2 View  Result Date: 06/27/2020 CLINICAL DATA:  55 year old male status post unwitnessed fall. Pain.  EXAM: THORACIC SPINE 2 VIEWS COMPARISON:  Chest radiographs 06/26/2020, 12/14/2017. FINDINGS: Normal thoracic segmentation. Stable thoracic kyphosis. Normal vertebral height and alignment. Relatively preserved disc spaces. Cervicothoracic junction alignment is within normal limits. No acute osseous abnormality identified. Posterior ribs appear grossly intact. Stable visible chest and abdomen. IMPRESSION: No acute osseous abnormality identified in the thoracic spine. Electronically Signed   By: Odessa Fleming M.D.   On: 06/27/2020 08:56   DG Lumbar Spine 2-3 Views  Result Date: 06/27/2020 CLINICAL DATA:  55 year old male status post unwitnessed fall. Pain. EXAM: LUMBAR SPINE - 2-3 VIEW COMPARISON:  Thoracic radiographs today. FINDINGS: Normal lumbar segmentation. Straightening of lumbar lordosis. No spondylolisthesis. Preserved lumbar vertebral height. Relatively preserved disc spaces. No acute osseous abnormality identified. Visible sacrum, SI joints and pelvis appear intact. Calcified abdominal aortic atherosclerosis. Negative other abdominal and pelvic visceral contours. IMPRESSION: 1. No acute osseous abnormality identified in the lumbar spine. 2.  Aortic Atherosclerosis (ICD10-I70.0). Electronically Signed   By: Odessa Fleming M.D.   On: 06/27/2020 08:57   DG Shoulder Right  Result Date: 06/27/2020 CLINICAL DATA:  55 year old male status post unwitnessed fall. Pain. EXAM: RIGHT SHOULDER - 2+ VIEW COMPARISON:  Right shoulder series 05/10/2020. FINDINGS: No glenohumeral joint dislocation. Bone mineralization is within normal limits. Intact proximal right humerus. Degenerative spurring at the inferior glenoid and right AC joint again noted. Visible right clavicle and scapula appear intact. No acute osseous abnormality identified. Negative visible right ribs and chest. IMPRESSION: No acute fracture or dislocation identified about the right shoulder. Electronically Signed   By: Odessa Fleming M.D.   On: 06/27/2020 08:55   CT  HEAD WO CONTRAST  Result Date: 06/27/2020 CLINICAL DATA:  Head trauma,  moderate/severe. Fall. Neck pain, acute, no red flags. Additional history provided: Found down in ER by nurse. EXAM: CT HEAD WITHOUT CONTRAST CT CERVICAL SPINE WITHOUT CONTRAST TECHNIQUE: Multidetector CT imaging of the head and cervical spine was performed following the standard protocol without intravenous contrast. Multiplanar CT image reconstructions of the cervical spine were also generated. COMPARISON:  No pertinent prior exams available for comparison. FINDINGS: CT HEAD FINDINGS Brain: Motion degraded exam. Cerebral volume is normal. There is no acute intracranial hemorrhage. No demarcated cortical infarct. No extra-axial fluid collection. No evidence of intracranial mass. No midline shift. Vascular: No hyperdense vessel Skull: Normal. Negative for fracture or focal lesion. Sinuses/Orbits: Visualized orbits show no acute finding. Trace mucosal thickening within the imaged right maxillary sinus. Other: Bilateral middle ear/mastoid effusions. A small left parietal scalp hematoma is questioned. CT CERVICAL SPINE FINDINGS Alignment: Leftward rotation of C1 upon C2. Straightening of the expected cervical lordosis. Mild cervical levocurvature. Trace C3-C4 and C4-C5 grade 1 anterolisthesis. Skull base and vertebrae: The basion-dental and atlanto-dental intervals are maintained.No evidence of acute fracture to the cervical spine. Soft tissues and spinal canal: No prevertebral fluid or swelling. No visible canal hematoma. Disc levels: Cervical spondylosis cervical spondylosis without high-grade bony spinal canal or neural foraminal narrowing. Notably, there is moderate facet arthrosis at C2-C3, C3-C4 and C4-C5. Upper chest: No consolidation within the imaged lung apices. No visible pneumothorax. 2 cm left apical bulla. IMPRESSION: CT head: 1. Motion degraded exam. 2. No evidence of acute intracranial abnormality. 3. A small left parietal scalp  hematoma is questioned. 4. Bilateral middle ear/mastoid effusions. CT cervical spine: 1. No evidence of acute fracture to the cervical spine. 2. Leftward rotation of C1 upon C2, which may be related to patient head positioning at the time of image acquisition. Clinical correlation is recommended. 3. Mild C3-C4 and C4-C5 grade 1 anterolisthesis. 4. Mild cervical levocurvature. 5. Cervical spondylosis, as described. Electronically Signed   By: Jackey Loge DO   On: 06/27/2020 09:19   CT CERVICAL SPINE WO CONTRAST  Result Date: 06/27/2020 CLINICAL DATA:  Head trauma, moderate/severe. Fall. Neck pain, acute, no red flags. Additional history provided: Found down in ER by nurse. EXAM: CT HEAD WITHOUT CONTRAST CT CERVICAL SPINE WITHOUT CONTRAST TECHNIQUE: Multidetector CT imaging of the head and cervical spine was performed following the standard protocol without intravenous contrast. Multiplanar CT image reconstructions of the cervical spine were also generated. COMPARISON:  No pertinent prior exams available for comparison. FINDINGS: CT HEAD FINDINGS Brain: Motion degraded exam. Cerebral volume is normal. There is no acute intracranial hemorrhage. No demarcated cortical infarct. No extra-axial fluid collection. No evidence of intracranial mass. No midline shift. Vascular: No hyperdense vessel Skull: Normal. Negative for fracture or focal lesion. Sinuses/Orbits: Visualized orbits show no acute finding. Trace mucosal thickening within the imaged right maxillary sinus. Other: Bilateral middle ear/mastoid effusions. A small left parietal scalp hematoma is questioned. CT CERVICAL SPINE FINDINGS Alignment: Leftward rotation of C1 upon C2. Straightening of the expected cervical lordosis. Mild cervical levocurvature. Trace C3-C4 and C4-C5 grade 1 anterolisthesis. Skull base and vertebrae: The basion-dental and atlanto-dental intervals are maintained.No evidence of acute fracture to the cervical spine. Soft tissues and spinal  canal: No prevertebral fluid or swelling. No visible canal hematoma. Disc levels: Cervical spondylosis cervical spondylosis without high-grade bony spinal canal or neural foraminal narrowing. Notably, there is moderate facet arthrosis at C2-C3, C3-C4 and C4-C5. Upper chest: No consolidation within the imaged lung apices. No visible pneumothorax. 2 cm  left apical bulla. IMPRESSION: CT head: 1. Motion degraded exam. 2. No evidence of acute intracranial abnormality. 3. A small left parietal scalp hematoma is questioned. 4. Bilateral middle ear/mastoid effusions. CT cervical spine: 1. No evidence of acute fracture to the cervical spine. 2. Leftward rotation of C1 upon C2, which may be related to patient head positioning at the time of image acquisition. Clinical correlation is recommended. 3. Mild C3-C4 and C4-C5 grade 1 anterolisthesis. 4. Mild cervical levocurvature. 5. Cervical spondylosis, as described. Electronically Signed   By: Jackey LogeKyle  Golden DO   On: 06/27/2020 09:19   DG Chest Port 1 View  Result Date: 06/26/2020 CLINICAL DATA:  Short of breath, malaise EXAM: PORTABLE CHEST 1 VIEW COMPARISON:  12/14/2017 FINDINGS: The heart size and mediastinal contours are within normal limits. Both lungs are clear. The visualized skeletal structures are unremarkable. IMPRESSION: No active disease. Electronically Signed   By: Marlan Palauharles  Clark M.D.   On: 06/26/2020 16:41     LOS: 2 days   Lanae Boastamesh Eydan Chianese, MD [facility]  06/28/2020, 10:27 AM

## 2020-06-28 NOTE — Op Note (Signed)
[facility] Patient Name: Jose Morrow Procedure Date : 06/28/2020 MRN: [id number] Attending MD: Wilhemina Bonito. Marina Goodell , MD Date of Birth: Feb 21, 1966 CSN: [id number] Age: 55 Admit Type: Inpatient Procedure:                Upper GI endoscopy with biopsies Indications:              Acute post hemorrhagic anemia, Melena Providers:                Wilhemina Bonito. Marina Goodell, MD, Charlett Lango, RN, Tillie Fantasia, RN, Beryle Beams, Technician, Harrington Challenger, Technician, Lillette Boxer, CRNA Referring MD:             [facility] Medicines:                Monitored Anesthesia Care Complications:            No immediate complications. Estimated Blood Loss:     Estimated blood loss: none. Procedure:                Pre-Anesthesia Assessment:                           - Prior to the procedure, a History and Physical                            was performed, and patient medications and                            allergies were reviewed. The patient's tolerance of                            previous anesthesia was also reviewed. The risks                            and benefits of the procedure and the sedation                            options and risks were discussed with the patient.                            All questions were answered, and informed consent                            was obtained. Prior Anticoagulants: The patient has                            taken no previous anticoagulant or antiplatelet                            agents. ASA Grade Assessment: III - A patient with  severe systemic disease. After reviewing the risks                            and benefits, the patient was deemed in                            satisfactory condition to undergo the procedure.                           After obtaining informed consent, the endoscope was                            passed under direct vision. Throughout  the                            procedure, the patient's blood pressure, pulse, and                            oxygen saturations were monitored continuously. The                            [id number]-[id number] ([id number]) Olympus gastroscope was                            introduced through the mouth, and advanced to the                            second part of duodenum. The upper GI endoscopy was                            accomplished without difficulty. The patient                            tolerated the procedure well. Scope In: Scope Out: Findings:      The esophagus was normal.      One gastric ulcer was found at the incisura. This was cratered and       nonbleeding. It was essentially clean based with trace pigmented       material noted. Not felt to require endoscopic hemostatic therapy. In       addition there was a small prepyloric ulcer which was clean-based.       Biopsies were taken with a cold forceps, from the antrum, for histology.      The examined duodenum revealed a clean-based 1 cm ulcer.      The cardia and gastric fundus were normal on retroflexion, save small       hiatal hernia. Impression:               1. Giant gastric ulcer and small gastric ulcer                            without bleeding or stigmata                           2. Clean-based duodenal ulcer  3. Otherwise normal EGD. Status post biopsies. Recommendation:           1. Pantoprazole 40 mg twice daily until further                            notice                           2. Avoid ALL NSAIDS                           3. Stop drinking alcohol                           4. Follow-up biopsies. Treat Helicobacter pylori if                            present                           5. Transfused to clinically optimal hemoglobin                           6. This patient will need repeat EGD in 8 weeks to                            assess for ulcer healing                           GI  will continue to follow                           . Procedure Code(s):        --- Professional ---                           [id number], Esophagogastroduodenoscopy, flexible,                            transoral; with biopsy, single or multiple Diagnosis Code(s):        --- Professional ---                           K25.9, Gastric ulcer, unspecified as acute or                            chronic, without hemorrhage or perforation                           D62, Acute posthemorrhagic anemia                           K92.1, Melena (includes Hematochezia) CPT copyright 2019 [employer] Association. All rights reserved. The codes documented in this report are preliminary and upon coder review may  be revised to meet current compliance requirements. Wilhemina Bonito. Marina Goodell, MD 06/28/2020 1:36:36 PM This report has been signed electronically. Number of Addenda: 0

## 2020-06-28 NOTE — Transfer of Care (Signed)
Immediate Anesthesia Transfer of Care Note  Patient: Jose Morrow  Procedure(s) Performed: ESOPHAGOGASTRODUODENOSCOPY (EGD) WITH PROPOFOL (N/A ) BIOPSY  Patient Location: PACU and Endoscopy Unit  Anesthesia Type:MAC  Level of Consciousness: drowsy  Airway & Oxygen Therapy: Patient Spontanous Breathing  Post-op Assessment: Report given to RN and Post -op Vital signs reviewed and stable  Post vital signs: Reviewed and stable  Last Vitals:  Vitals Value Taken Time  BP    Temp    Pulse    Resp    SpO2      Last Pain:  Vitals:   06/28/20 1231  TempSrc: Oral  PainSc: 8          Complications: No complications documented.

## 2020-06-28 NOTE — TOC Initial Note (Addendum)
Transition of Care [facility]) - Initial/Assessment Note    Patient Details  Name: Jose Morrow MRN: [id number] Date of Birth: 1966-02-10  Transition of [facility]) CM/SW Contact:    Lockie Pares, RN Phone Number: 06/28/2020, 8:51 AM  Clinical Narrative:                 31 uninsured male in with GI bleed, ETOH, DM hbg A1c 11, RAN OUT OF METFORMIN. Having EGD today. Will need MATCH for post hospitalization medications, as patient is uninsured. CM will follow for needs 0933: MATCH done please send DC meds to [facility]  Expected Discharge Plan: Home/Self Care Barriers to Discharge: Continued Medical Work up,Inadequate or no insurance   Patient Goals and CMS Choice        Expected Discharge Plan and Services Expected Discharge Plan: Home/Self Care   Discharge Planning Services: CM Consult,Indigent Health Clinic   Living arrangements for the past 2 months: Single Family Home                                      Prior Living Arrangements/Services Living arrangements for the past 2 months: Single Family Home   Patient language and need for interpreter reviewed:: Yes        Need for Family Participation in Patient Care: Yes (Comment) Care giver support system in place?: Yes (comment)   Criminal Activity/Legal Involvement Pertinent to Current Situation/Hospitalization: No - Comment as needed  Activities of Daily Living Home Assistive Devices/Equipment: None ADL Screening (condition at time of admission) Patient's cognitive ability adequate to safely complete daily activities?: Yes Is the patient deaf or have difficulty hearing?: No Does the patient have difficulty seeing, even when wearing glasses/contacts?: No Does the patient have difficulty concentrating, remembering, or making decisions?: No Patient able to express need for assistance with ADLs?: Yes Does the patient have difficulty dressing or bathing?: No Independently performs ADLs?: Yes (appropriate for  developmental age) Does the patient have difficulty walking or climbing stairs?: No Weakness of Legs: None Weakness of Arms/Hands: None  Permission Sought/Granted                  Emotional Assessment       Orientation: : Oriented to Self,Oriented to Place,Oriented to  Time,Oriented to Situation Alcohol / Substance Use: Alcohol Use Psych Involvement: No (comment)  Admission diagnosis:  Acute upper GI bleeding [K92.2] Fall [W19.XXXA] Acute upper GI bleed [K92.2] Acute kidney injury (HCC) [N17.9] Hyperglycemia without ketosis [R73.9] Patient Active Problem List   Diagnosis Date Noted  . Acute blood loss anemia   . Melena   . Acute upper GI bleed 06/26/2020  . Symptomatic anemia 06/26/2020  . Uncontrolled type 2 diabetes mellitus with hyperglycemia (HCC) 06/26/2020  . Acute kidney injury (HCC) 06/26/2020  . Hypertension   . Hyperglycemia without ketosis    PCP:  Patient, No Pcp Per (Inactive) Pharmacy:   [id number] - [place] ([place]), [place] - [address] [place] ([place]) [place] [address] Phone: [phone] Fax: [phone]     Social Determinants of Health (SDOH) Interventions    Readmission Risk Interventions No flowsheet data found.

## 2020-06-28 NOTE — Interval H&P Note (Signed)
History and Physical Interval Note:  06/28/2020 12:48 PM  Jose Morrow  has presented today for surgery, with the diagnosis of Heme positive, black stools.  Anemia.  Using NSAIDs for shoulder pain..  The various methods of treatment have been discussed with the patient and family. After consideration of risks, benefits and other options for treatment, the patient has consented to  Procedure(s): ESOPHAGOGASTRODUODENOSCOPY (EGD) WITH PROPOFOL (N/A) as a surgical intervention.  The patient's history has been reviewed, patient examined, no change in status, stable for surgery.  I have reviewed the patient's chart and labs.  Questions were answered to the patient's satisfaction.     Yancey Flemings

## 2020-06-28 NOTE — Anesthesia Postprocedure Evaluation (Signed)
Anesthesia Post Note  Patient: Jose Morrow  Procedure(s) Performed: ESOPHAGOGASTRODUODENOSCOPY (EGD) WITH PROPOFOL (N/A ) BIOPSY     Patient location during evaluation: PACU Anesthesia Type: MAC Level of consciousness: awake and alert Pain management: pain level controlled Vital Signs Assessment: post-procedure vital signs reviewed and stable Respiratory status: spontaneous breathing, nonlabored ventilation and respiratory function stable Cardiovascular status: blood pressure returned to baseline and stable Postop Assessment: no apparent nausea or vomiting Anesthetic complications: no   No complications documented.  Last Vitals:  Vitals:   06/28/20 1350 06/28/20 1407  BP: (!) 150/64 (!) 148/87  Pulse: 77 73  Resp: (!) 22 20  Temp:  36.6 C  SpO2: 99% 99%    Last Pain:  Vitals:   06/28/20 1407  TempSrc: Oral  PainSc: 0-No pain                 Pervis Hocking

## 2020-06-29 ENCOUNTER — Telehealth: Payer: Self-pay

## 2020-06-29 ENCOUNTER — Other Ambulatory Visit (HOSPITAL_COMMUNITY): Payer: Self-pay

## 2020-06-29 ENCOUNTER — Encounter (HOSPITAL_COMMUNITY): Payer: Self-pay | Admitting: Internal Medicine

## 2020-06-29 DIAGNOSIS — K269 Duodenal ulcer, unspecified as acute or chronic, without hemorrhage or perforation: Secondary | ICD-10-CM

## 2020-06-29 LAB — TYPE AND SCREEN
ABO/RH(D): B POS
Antibody Screen: NEGATIVE
Unit division: 0
Unit division: 0
Unit division: 0
Unit division: 0

## 2020-06-29 LAB — CBC
HCT: 22.1 % — ABNORMAL LOW (ref 39.0–52.0)
Hemoglobin: 7.8 g/dL — ABNORMAL LOW (ref 13.0–17.0)
MCH: 29.9 pg (ref 26.0–34.0)
MCHC: 35.3 g/dL (ref 30.0–36.0)
MCV: 84.7 fL (ref 80.0–100.0)
Platelets: 195 10*3/uL (ref 150–400)
RBC: 2.61 MIL/uL — ABNORMAL LOW (ref 4.22–5.81)
RDW: 13.6 % (ref 11.5–15.5)
WBC: 7.5 10*3/uL (ref 4.0–10.5)
nRBC: 0.7 % — ABNORMAL HIGH (ref 0.0–0.2)

## 2020-06-29 LAB — BASIC METABOLIC PANEL
Anion gap: 4 — ABNORMAL LOW (ref 5–15)
BUN: 15 mg/dL (ref 6–20)
CO2: 26 mmol/L (ref 22–32)
Calcium: 7.8 mg/dL — ABNORMAL LOW (ref 8.9–10.3)
Chloride: 106 mmol/L (ref 98–111)
Creatinine, Ser: 1.1 mg/dL (ref 0.61–1.24)
GFR, Estimated: >60 mL/min (ref >60)
Glucose, Bld: 106 mg/dL — ABNORMAL HIGH (ref 70–99)
Potassium: 3.4 mmol/L — ABNORMAL LOW (ref 3.5–5.1)
Sodium: 136 mmol/L (ref 135–145)

## 2020-06-29 LAB — BPAM RBC
Blood Product Expiration Date: 202205112359
Blood Product Expiration Date: 202205142359
Blood Product Expiration Date: 202205212359
Blood Product Expiration Date: 202205222359
ISSUE DATE / TIME: 202204261913
ISSUE DATE / TIME: 202204270947
ISSUE DATE / TIME: 202204272259
ISSUE DATE / TIME: 202204280320
Unit Type and Rh: 7300
Unit Type and Rh: 7300
Unit Type and Rh: 7300
Unit Type and Rh: 7300

## 2020-06-29 LAB — GLUCOSE, CAPILLARY
Glucose-Capillary: 120 mg/dL — ABNORMAL HIGH (ref 70–99)
Glucose-Capillary: 121 mg/dL — ABNORMAL HIGH (ref 70–99)
Glucose-Capillary: 166 mg/dL — ABNORMAL HIGH (ref 70–99)
Glucose-Capillary: 167 mg/dL — ABNORMAL HIGH (ref 70–99)
Glucose-Capillary: 179 mg/dL — ABNORMAL HIGH (ref 70–99)
Glucose-Capillary: 205 mg/dL — ABNORMAL HIGH (ref 70–99)
Glucose-Capillary: 95 mg/dL (ref 70–99)

## 2020-06-29 MED ORDER — INSULIN DETEMIR 100 UNIT/ML FLEXPEN
7.0000 [IU] | PEN_INJECTOR | Freq: Every day | SUBCUTANEOUS | 0 refills | Status: DC
  Filled 2020-06-29: qty 3, 28d supply, fill #0

## 2020-06-29 MED ORDER — PANTOPRAZOLE SODIUM 40 MG PO TBEC
40.0000 mg | DELAYED_RELEASE_TABLET | Freq: Two times a day (BID) | ORAL | Status: DC
  Administered 2020-06-29 – 2020-06-30 (×3): 40 mg via ORAL
  Filled 2020-06-29 (×3): qty 1

## 2020-06-29 MED ORDER — PANTOPRAZOLE SODIUM 40 MG PO TBEC: 40.0000 mg | DELAYED_RELEASE_TABLET | Freq: Two times a day (BID) | ORAL | Status: DC

## 2020-06-29 MED ORDER — ACCU-CHEK GUIDE VI STRP
ORAL_STRIP | 12 refills | Status: DC
  Filled 2020-06-29: qty 100, 30d supply, fill #0

## 2020-06-29 MED ORDER — INSULIN GLARGINE 100 UNIT/ML ~~LOC~~ SOLN
7.0000 [IU] | Freq: Every day | SUBCUTANEOUS | Status: DC
  Administered 2020-06-30: 7 [IU] via SUBCUTANEOUS
  Filled 2020-06-29: qty 0.07

## 2020-06-29 MED ORDER — PANTOPRAZOLE SODIUM 40 MG PO TBEC
40.0000 mg | DELAYED_RELEASE_TABLET | Freq: Two times a day (BID) | ORAL | 1 refills | Status: DC
  Filled 2020-06-29: qty 60, 30d supply, fill #0

## 2020-06-29 MED ORDER — ACCU-CHEK GUIDE W/DEVICE KIT
PACK | 0 refills | Status: DC
  Filled 2020-06-29: qty 1, 1d supply, fill #0

## 2020-06-29 MED ORDER — INSULIN PEN NEEDLE 32G X 4 MM MISC
1.0000 | Freq: Every day | 0 refills | Status: DC
  Filled 2020-06-29: qty 100, 30d supply, fill #0

## 2020-06-29 MED ORDER — ACCU-CHEK SOFTCLIX LANCETS MISC
5 refills | Status: AC
  Filled 2020-06-29: qty 100, 30d supply, fill #0

## 2020-06-29 MED ORDER — BLOOD GLUCOSE MONITOR KIT
PACK | 0 refills | Status: DC
  Filled 2020-06-29: qty 1, fill #0

## 2020-06-29 NOTE — Telephone Encounter (Signed)
-----   Message from Unk Lightning, [place] sent at 06/29/2020 10:30 AM EDT ----- Regarding: Follow up This patient needs follow-up in the clinic with one of the apps, can be me if I have an opening within the next 3 to 4 weeks for follow-up of gastric ulcer.  He will need EGD arranged at that time.  Thank you, JLL

## 2020-06-29 NOTE — Progress Notes (Signed)
PROGRESS NOTE    MURAT RIDEOUT  EXB:[id number] DOB: 02-14-1966 DOA: 06/26/2020 PCP: Patient, No Pcp Per (Inactive)   Chief Complaint  Patient presents with  . Hyperglycemia    Brief Narrative: 54yom w multiple complex medical issues- type 2 diabetes mellitus and hypertension, now presenting to the emergency department for evaluation of melena, blurred vision, lightheadedness, polyuria, and polydipsia. As per the report Patient suffered a right shoulder injury at work in early March, started on muscle relaxer and ibuprofen, has been taking 4 or 5 tablets of ibuprofen multiple times daily, and then developed heartburn and black stools 3 days ago.  He was seen in the emergency department yesterday, hemoglobin was fairly stable, he was instructed to discontinue NSAID, given Protonix and prednisone, and returned home.  He has continued to have dark stools and has also developed polyuria, polydipsia, lightheadedness, and blurred vision In the ED vitals stable but tachycardic in 130s blood work showed AKI uncontrolled hyperglycemia with DKA acute blood loss anemia hemoglobin six-point Gramm from 12.5 g FOBT positive GI was consulted placed on PPI IV fluids 2 units PRBC ordered and admitted.  Subjective: Seen and examined this morning.  He is oriented not in acute distress no new complaint.   Appears alert awake. Having bowel movement but blackish stool still present-likely old blood  Assessment & Plan:  Acute severe symptomatic blood loss anemia Presumed GI bleeding/FOBT positive melanotic stool Giant gastric ulcer,small gastric ulcer-without stigmata of bleeding and duodenal ulcer clean-based: S/p 4 unit PRBC H&H this morning improved 7.8 gm EGD report noted patient has giant gastric ulcer without bleeding or stigmata, clean-based duodenal ulcer.  GI advised to continue PPI twice daily indefinitely, avoid NSAIDs, stop drinking follow-up biopsy and H. Pylori.  Will need repeat EGD in 8 weeks.   Changed to soft diet advance as tolerated. CBC in am. Recent Labs  Lab 06/27/20 1551 06/27/20 2216 06/28/20 0832 06/28/20 1446 06/29/20 0247  HGB 6.1* 5.5* 8.1* 7.7* 7.8*  HCT 17.7* 15.8* 22.8* 22.4* 22.1*   Poorly controlled diabetes mellitus, ran out of metformin at home.  With normal anion gap placed on insulin drip:Switched to Lantus plus ssi- but lantus was held due to n.p.o. for EGD 4/28- Blood sugar fluctuating not in 95> 205- on Lantus 5 units> increased to 7 units from tomorrow, cont sliding scale insulin.HbA1c poorly controlled 11.1-will likely need insulin on discharge. Recent Labs  Lab 06/28/20 2005 06/29/20 0023 06/29/20 0336 06/29/20 0815 06/29/20 1233  GLUCAP 260* 167* 95 120* 205*   Heavy alcohol use periodically: On CIWA scale Ativan, thiamine folate.  Monitor for signs of withdrawal.Per sister he drinks every now and then but does not drink everyday-once a wk?[name]  Overnight received Ativan 4/27.  This morning appears stable.  Unwitnessed fall in the ED 4/27 am- CT head CT C-spine and x-ray thoracic spine and lumbar spine and right shoulder no acute finding.  Continue fall precaution supportive care.   AKI in the setting of NSAID use/uncontrolled diabetes.  It has resolved with IV fluids.  Encourage oral intake Recent Labs  Lab 06/25/20 0103 06/26/20 1510 06/27/20 0407 06/28/20 0832 06/29/20 0247  BUN 33* 53* 45* 29* 15  CREATININE 1.33* 1.68* 1.32* 1.23 1.10   Hypertension: BP appears to stable continue to hold oral meds   Overweight with BMI 27.8.  Will benefit with weight loss.  Transition of care: I will send prescription for PPI twice daily and Lantus 7 units FlexPen to transition care pharmacy today  in anticipation of discharge tomorrow but may need to change the insulin regimen tomorrow ( if  He needs more) and will need to adjust/modify the label  Diet Order            DIET SOFT Room service appropriate? Yes; Fluid consistency: Thin  Diet effective  now                Patient's Body mass index is 27.18 kg/m.  DVT prophylaxis: SCDs Start: 06/26/20 2047 Code Status:   Code Status: Full Code Family Communication: plan of care discussed with patient at bedside. I called and discussed with patient's sister  4/27, girlfriend updated at the bedside 4/28.   Discussed patient's sister at the bedside they will  work on getting his insurance through [employer].  Status HQ:PRFFMBWGY Remains inpatient appropriate because:Ongoing diagnostic testing needed not appropriate for outpatient work up, IV treatments appropriate due to intensity of illness or inability to take PO and Inpatient level of care appropriate due to severity of illness  Dispo: The patient is from: Home              Anticipated d/c is to: Home likely tomorrow if blood sugar remains stable              Patient currently is not medically stable to d/c.   Difficult to place patient No Unresulted Labs (From admission, onward)          Start     Ordered   06/29/20 0500  CBC  Daily,   R     Question:  Specimen collection method  Answer:  Lab=Lab collect   06/28/20 1433   06/27/20 0500  Basic metabolic panel  (Hyperglycemia (not DKA or HHS))  Daily,   STAT      06/26/20 2048   06/26/20 1510  CBC with Differential (PNL)  (Hyperglycemia (not DKA or HHS))  ONCE - STAT,   STAT        06/26/20 1510          Medications reviewed:  Scheduled Meds: . folic acid  1 mg Oral Daily  . insulin aspart  0-15 Units Subcutaneous Q4H  . [START ON 06/30/2020] insulin glargine  7 Units Subcutaneous Daily  . multivitamin with minerals  1 tablet Oral Daily  . nicotine  21 mg Transdermal Daily  . pantoprazole  40 mg Oral BID  . thiamine  100 mg Oral Daily   Continuous Infusions: . sodium chloride      Consultants: Cardiology    Procedures:  EGD report as below 4/28 Impression:               1. Giant gastric ulcer and small gastric ulcer                            without  bleeding or stigmata                           2. Clean-based duodenal ulcer                           3. Otherwise normal EGD. Status post biopsies. Recommendation:           1. Pantoprazole 40 mg twice daily until further  notice                           2. Avoid ALL NSAIDS                           3. Stop drinking alcohol                           4. Follow-up biopsies. Treat Helicobacter pylori if                            present                           5. Transfused to clinically optimal hemoglobin                           6. This patient will need repeat EGD in 8 weeks to                            assess for ulcer healing                           GI will continue to follow  Antimicrobials: Anti-infectives (From admission, onward)   None     Culture/Microbiology No results found for: SDES, SPECREQUEST, CULT, REPTSTATUS  Other culture-see note  Objective: Vitals: Today's Vitals   06/29/20 0338 06/29/20 0817 06/29/20 0856 06/29/20 1234  BP: 123/76 124/75  136/77  Pulse: 71 69  67  Resp: (!) 22 20  (!) 22  Temp: 98.2 F (36.8 C) 97.8 F (36.6 C)  98.7 F (37.1 C)  TempSrc: Oral Oral  Oral  SpO2: 96% 98%  99%  Weight:      Height:      PainSc:   0-No pain     Intake/Output Summary (Last 24 hours) at 06/29/2020 1406 Last data filed at 06/29/2020 1130 Gross per 24 hour  Intake 2319.56 ml  Output 760 ml  Net 1559.56 ml   Filed Weights   06/26/20 1608  Weight: 83.5 kg   Weight change:   Intake/Output from previous day: 04/28 0701 - 04/29 0700 In: 2206.8 [I.V.:2206.8] Out: 760 [Urine:760] Intake/Output this shift: Total I/O In: 1668.7 [I.V.:1668.7] Out: -  Filed Weights   06/26/20 1608  Weight: 83.5 kg    Examination: eneral exam: AAOx xolder than stated age, weak appearing. HEENT:Oral mucosa moist, Ear/Nose WNL grossly, dentition normal. Respiratory system: bilaterally diminished, ,no use of accessory  muscle Cardiovascular system: S1 & S2 +, No JVD,. Gastrointestinal system: Abdomen soft,flat NT,ND, BS+ Nervous System:Alert, awake, moving extremities and grossly nonfocal Extremities: no edema, distal peripheral pulses palpable.  Skin: No rashes,no icterus. MSK: Normal muscle bulk,tone, power Data Reviewed: I have personally reviewed following labs and imaging studies CBC: Recent Labs  Lab 06/25/20 0103 06/25/20 0455 06/26/20 1730 06/27/20 0630 06/27/20 1551 06/27/20 2216 06/28/20 0832 06/28/20 1446 06/29/20 0247  WBC 7.8  --  8.1  --   --   --   --   --  7.5  NEUTROABS  --   --  7.3  --   --   --   --   --   --  HGB 12.5*   < > 6.0*   < > 6.1* 5.5* 8.1* 7.7* 7.8*  HCT 37.0*   < > 17.5*   < > 17.7* 15.8* 22.8* 22.4* 22.1*  MCV 81.7  --  82.5  --   --   --   --   --  84.7  PLT 315  --  223  --   --   --   --   --  195   < > = values in this interval not displayed.   Basic Metabolic Panel: Recent Labs  Lab 06/25/20 0103 06/26/20 1510 06/27/20 0407 06/28/20 0832 06/29/20 0247  NA 133* 130* 139 140 136  K 4.2 3.9 4.1 3.7 3.4*  CL 96* 101 110 109 106  CO2 29 24 23 25 26   GLUCOSE 349* 523* 174* 131* 106*  BUN 33* 53* 45* 29* 15  CREATININE 1.33* 1.68* 1.32* 1.23 1.10  CALCIUM 9.1 7.7* 7.8* 7.6* 7.8*  MG  --   --  2.0  --   --   PHOS  --   --  3.1  --   --    GFR: Estimated Creatinine Clearance: 76.8 mL/min (by C-G formula based on SCr of 1.1 mg/dL). Liver Function Tests: Recent Labs  Lab 06/25/20 0103 06/27/20 0407  AST 9* 13*  ALT 9 8  ALKPHOS 47 30*  BILITOT 0.5 0.5  PROT 6.2* 4.0*  ALBUMIN 3.1* 2.3*   No results for input(s): LIPASE, AMYLASE in the last 168 hours. No results for input(s): AMMONIA in the last 168 hours. Coagulation Profile: Recent Labs  Lab 06/27/20 0407  INR 1.2   Cardiac Enzymes: No results for input(s): CKTOTAL, CKMB, CKMBINDEX, TROPONINI in the last 168 hours. BNP (last 3 results) No results for input(s): PROBNP in the last  8760 hours. HbA1C: Recent Labs    06/27/20 0410  HGBA1C 11.1*   CBG: Recent Labs  Lab 06/28/20 2005 06/29/20 0023 06/29/20 0336 06/29/20 0815 06/29/20 1233  GLUCAP 260* 167* 95 120* 205*   Lipid Profile: No results for input(s): CHOL, HDL, LDLCALC, TRIG, CHOLHDL, LDLDIRECT in the last 72 hours. Thyroid Function Tests: No results for input(s): TSH, T4TOTAL, FREET4, T3FREE, THYROIDAB in the last 72 hours. Anemia Panel: No results for input(s): VITAMINB12, FOLATE, FERRITIN, TIBC, IRON, RETICCTPCT in the last 72 hours. Sepsis Labs: No results for input(s): PROCALCITON, LATICACIDVEN in the last 168 hours.  Recent Results (from the past 240 hour(s))  Resp Panel by RT-PCR (Flu A&B, Covid) Nasopharyngeal Swab     Status: None   Collection Time: 06/26/20  8:10 PM   Specimen: Nasopharyngeal Swab; Nasopharyngeal(NP) swabs in vial transport medium  Result Value Ref Range Status   SARS Coronavirus 2 by RT PCR NEGATIVE NEGATIVE Final    Comment: (NOTE) SARS-CoV-2 target nucleic acids are NOT DETECTED.  The SARS-CoV-2 RNA is generally detectable in upper respiratory specimens during the acute phase of infection. The lowest concentration of SARS-CoV-2 viral copies this assay can detect is 138 copies/mL. A negative result does not preclude SARS-Cov-2 infection and should not be used as the sole basis for treatment or other patient management decisions. A negative result may occur with  improper specimen collection/handling, submission of specimen other than nasopharyngeal swab, presence of viral mutation(s) within the areas targeted by this assay, and inadequate number of viral copies(<138 copies/mL). A negative result must be combined with clinical observations, patient history, and epidemiological information. The expected result is Negative.  Fact Sheet for Patients:  BloggerCourse.com  Fact Sheet for Healthcare Providers:   SeriousBroker.it  This test is no t yet approved or cleared by the [place] FDA and  has been authorized for detection and/or diagnosis of SARS-CoV-2 by FDA under an Emergency Use Authorization (EUA). This EUA will remain  in effect (meaning this test can be used) for the duration of the COVID-19 declaration under Section 564(b)(1) of the Act, 21 U.S.C.section 360bbb-3(b)(1), unless the authorization is terminated  or revoked sooner.       Influenza A by PCR NEGATIVE NEGATIVE Final   Influenza B by PCR NEGATIVE NEGATIVE Final    Comment: (NOTE) The Xpert Xpress SARS-CoV-2/FLU/RSV plus assay is intended as an aid in the diagnosis of influenza from Nasopharyngeal swab specimens and should not be used as a sole basis for treatment. Nasal washings and aspirates are unacceptable for Xpert Xpress SARS-CoV-2/FLU/RSV testing.  Fact Sheet for Patients: BloggerCourse.com  Fact Sheet for Healthcare Providers: SeriousBroker.it  This test is not yet approved or cleared by the [place] FDA and has been authorized for detection and/or diagnosis of SARS-CoV-2 by FDA under an Emergency Use Authorization (EUA). This EUA will remain in effect (meaning this test can be used) for the duration of the COVID-19 declaration under Section 564(b)(1) of the Act, 21 U.S.C. section 360bbb-3(b)(1), unless the authorization is terminated or revoked.  Performed at [facility], [address], [place], [place] [address]      Radiology Studies: No results found.   LOS: 3 days   Lanae Boast, MD [facility]  06/29/2020, 2:06 PM

## 2020-06-29 NOTE — Progress Notes (Addendum)
Progress Note   Subjective  Chief Complaint: Melena, anemia  Status post EGD yesterday with large gastric and smaller duodenal ulcers  This morning patient has no complaints.  He tells me he did  Have 1 small bowel movement yesterday evening which was still black, none this morning.  Denies any abdominal pain or any complaints.   Objective   Vital signs in last 24 hours: Temp:  [97.8 F (36.6 C)-98.2 F (36.8 C)] 97.8 F (36.6 C) (04/29 0817) Pulse Rate:  [69-79] 69 (04/29 0817) Resp:  [20-26] 20 (04/29 0817) BP: (113-160)/(63-95) 124/75 (04/29 0817) SpO2:  [96 %-100 %] 98 % (04/29 0817) Last BM Date: 06/29/20 General:    AA male in NAD Heart:  Regular rate and rhythm; no murmurs Lungs: Respirations even and unlabored, lungs CTA bilaterally Abdomen:  Soft, nontender and nondistended. Normal bowel sounds. Extremities:  Without edema. Psych:  Cooperative. Normal mood and affect.  Intake/Output from previous day: 04/28 0701 - 04/29 0700 In: 2206.8 [I.V.:2206.8] Out: 760 [Urine:760]  Lab Results: Recent Labs    06/26/20 1730 06/27/20 0630 06/28/20 0832 06/28/20 1446 06/29/20 0247  WBC 8.1  --   --   --  7.5  HGB 6.0*   < > 8.1* 7.7* 7.8*  HCT 17.5*   < > 22.8* 22.4* 22.1*  PLT 223  --   --   --  195   < > = values in this interval not displayed.   BMET Recent Labs    06/27/20 0407 06/28/20 0832 06/29/20 0247  NA 139 140 136  K 4.1 3.7 3.4*  CL 110 109 106  CO2 23 25 26   GLUCOSE 174* 131* 106*  BUN 45* 29* 15  CREATININE 1.32* 1.23 1.10  CALCIUM 7.8* 7.6* 7.8*   LFT Recent Labs    06/27/20 0407  PROT 4.0*  ALBUMIN 2.3*  AST 13*  ALT 8  ALKPHOS 30*  BILITOT 0.5  BILIDIR <0.1  IBILI NOT CALCULATED   PT/INR Recent Labs    06/27/20 0407  LABPROT 14.7  INR 1.2   EGD 06/28/2020 Findings:      The esophagus was normal.      One gastric ulcer was found at the incisura. This was cratered and       nonbleeding. It was essentially clean based  with trace pigmented       material noted. Not felt to require endoscopic hemostatic therapy. In       addition there was a small prepyloric ulcer which was clean-based.       Biopsies were taken with a cold forceps, from the antrum, for histology.      The examined duodenum revealed a clean-based 1 cm ulcer.      The cardia and gastric fundus were normal on retroflexion, save small       hiatal hernia. Impression:       1. Giant gastric ulcer and small gastric ulcer                            without bleeding or stigmata                           2. Clean-based duodenal ulcer                           3. Otherwise normal EGD. Status post  biopsies. Recommendation:                                      1. Pantoprazole 40 mg twice daily until further                            notice                           2. Avoid ALL NSAIDS                           3. Stop drinking alcohol                           4. Follow-up biopsies. Treat Helicobacter pylori if                            present                           5. Transfused to clinically optimal hemoglobin                           6. This patient will need repeat EGD in 8 weeks to                            assess for ulcer healing                           GI will continue to follow   Assessment / Plan:   Assessment: 1.  Upper GI bleed with acute blood loss anemia and melena: EGD 06/28/2020 with giant gastric ulcer and small gastric ulcer as well as a clean-based duodenal ulcer, hemoglobin stable overnight 8.1--> 7.8 2.  Alcohol abuse  Plan: 1.  Continue Pantoprazole 40 mg twice daily 2.  Discussed with the patient that he needs to stop drinking alcohol and avoid all NSAIDs 3.  We will follow-up on biopsies and if H. pylori is present will call the patient and treat. 4.  Continue to monitor hemoglobin while the patient is hospitalized 5.  Patient will need repeat EGD in 8 weeks to assess for ulcer healing.  We will arrange for  clinic follow-up in the next 3 to 4 weeks so we can set that appointment. 6.  We will sign off.  Please let us know if we could be of any further assistance.  Thank you for your kind consultation.   LOS: 3 days   Unk Lightning  06/29/2020, 10:25 AM  GI ATTENDING  Interval history data reviewed.  Agree with comprehensive interval progress note as outlined above without additions or deletions.  Patient will be seen in our office by one of the advanced practitioners.  Thereafter set up for follow-up endoscopy to assess ulcer healing.  We will sign off.  Wilhemina Bonito. Eda Keys., M.D. [facility] Division of Gastroenterology

## 2020-06-29 NOTE — Telephone Encounter (Signed)
Message sent to schedulers for appointment to be scheduled.

## 2020-06-30 DIAGNOSIS — W19XXXS Unspecified fall, sequela: Secondary | ICD-10-CM

## 2020-06-30 DIAGNOSIS — D62 Acute posthemorrhagic anemia: Secondary | ICD-10-CM

## 2020-06-30 LAB — BASIC METABOLIC PANEL
Anion gap: 6 (ref 5–15)
BUN: 14 mg/dL (ref 6–20)
CO2: 25 mmol/L (ref 22–32)
Calcium: 7.7 mg/dL — ABNORMAL LOW (ref 8.9–10.3)
Chloride: 106 mmol/L (ref 98–111)
Creatinine, Ser: 1.12 mg/dL (ref 0.61–1.24)
GFR, Estimated: >60 mL/min (ref >60)
Glucose, Bld: 99 mg/dL (ref 70–99)
Potassium: 3.2 mmol/L — ABNORMAL LOW (ref 3.5–5.1)
Sodium: 137 mmol/L (ref 135–145)

## 2020-06-30 LAB — CBC
HCT: 21.5 % — ABNORMAL LOW (ref 39.0–52.0)
Hemoglobin: 7.5 g/dL — ABNORMAL LOW (ref 13.0–17.0)
MCH: 29.5 pg (ref 26.0–34.0)
MCHC: 34.9 g/dL (ref 30.0–36.0)
MCV: 84.6 fL (ref 80.0–100.0)
Platelets: 208 10*3/uL (ref 150–400)
RBC: 2.54 MIL/uL — ABNORMAL LOW (ref 4.22–5.81)
RDW: 13.8 % (ref 11.5–15.5)
WBC: 7.2 10*3/uL (ref 4.0–10.5)
nRBC: 0 % (ref 0.0–0.2)

## 2020-06-30 LAB — GLUCOSE, CAPILLARY
Glucose-Capillary: 96 mg/dL (ref 70–99)
Glucose-Capillary: 120 mg/dL — ABNORMAL HIGH (ref 70–99)
Glucose-Capillary: 168 mg/dL — ABNORMAL HIGH (ref 70–99)

## 2020-06-30 MED ORDER — POTASSIUM CHLORIDE CRYS ER 20 MEQ PO TBCR
40.0000 meq | EXTENDED_RELEASE_TABLET | Freq: Once | ORAL | Status: AC
  Administered 2020-06-30: 40 meq via ORAL
  Filled 2020-06-30: qty 2

## 2020-06-30 MED ORDER — FERROUS SULFATE 325 (65 FE) MG PO TBEC: 325.0000 mg | DELAYED_RELEASE_TABLET | Freq: Every day | ORAL | 0 refills | Status: DC

## 2020-06-30 NOTE — Discharge Summary (Signed)
PATIENT DETAILS Name: Jose Morrow Age: 55 y.o. Sex: male Date of Birth: Jan 30, 1966 MRN: [id number]. Admitting Physician: Vianne Bulls, MD [id number], No Pcp Per (Inactive)  Admit Date: 06/26/2020 Discharge date: 06/30/2020  Recommendations for Outpatient Follow-up:  1. Follow up with PCP in 1-2 weeks 2. Please obtain CMP/CBC in one week 3. Please ensure follow-up with gastroenterology 4. Please follow EGD/biopsy results.  Admitted From:  Home  Disposition: [place]: No  Equipment/Devices: None  Discharge Condition: Stable  CODE STATUS: FULL CODE  Diet recommendation:  Diet Order            Diet - low sodium heart healthy           Diet Carb Modified           DIET SOFT Room service appropriate? Yes; Fluid consistency: Thin  Diet effective now                  Brief Summary: See H&P, Labs, Consult and Test reports for all details in brief, patient is a 55 year old male with history of DM-2, HTN-who presented with upper GI bleeding-uncontrolled hyperglycemia with nonketotic hyperglycemic state.  Started on IV insulin-given PRBC-placed on PPI-and subsequently admitted to the hospitalist service.  See below for further details.  Brief Hospital Course: Upper GI bleeding with acute blood loss anemia: Managed with PPI infusion-required a total of 4 units of PRBC transfusion.  Underwent EGD which showed gastric/duodenal ulcers.  H. pylori/biopsies pending at the time of discharge-Per gastroenterology they will follow on those results.  Hemoglobin has been stable for the past several days-his last BM was on 4/29-still somewhat dark but clearing up-likely reflecting old blood rather than any active bleeding.  No further recommendations from GI-they have signed off.  Plans are to continue PPI at discharge.  He has been counseled extensively regarding avoiding NSAIDs and alcohol use.  GI will arrange for outpatient follow-up and perhaps a second look  endoscopy in a few weeks.  PCP to repeat CBC in 1 week.  Hyperglycemic nonketotic state: Managed with IV insulin.  Has been transitioned to subcutaneous insulin.  See below.  Uncontrolled DM-2 (A1c 11.1 on 4/27): CBGs stable overnight-plans are to continue Lantus 7 units daily on discharge-resume metformin as well.  Follow with primary care practitioner for further optimization.  Note-Per patient-he feels very comfortable injecting insulin-I have asked RN to ensure that he is able to inject insulin prior to discharge.  He is aware of the symptoms of hypoglycemia and the needed interventions.  HTN: BP slowly creeping up-all antihypertensive medications were on hold-since BP now slowly starting to creep up-we will resume amlodipine-and continue to hold Maxide until seen by PCP.  HLD: Resume statin on discharge.  Intermittent heavy EtOH use: No signs of withdrawal.  Counseled that given peptic ulcer findings on EGD-he refrain from further alcohol use.  Mechanical fall: Imaging studies negative-Per chart review-this occurred while patient was in the emergency room.  Hypokalemia: This will be repleted prior to discharge.  PCP to recheck electrolytes in 1 week  Procedures 4/28 >>EGD: Giant gastric ulcer, smaller gastric ulcer without bleeding or stigmata, clean-based duodenal ulcer.  Discharge Diagnoses:  Principal Problem:   Acute upper GI bleed Active Problems:   Symptomatic anemia   Uncontrolled type 2 diabetes mellitus with hyperglycemia (HCC)   Acute kidney injury ([place])   Hypertension   Acute blood loss anemia   Melena   Acute gastric ulcer with hemorrhage  Duodenal ulcer   Discharge Instructions:  Activity:  As tolerated   Discharge Instructions    Call MD for:  difficulty breathing, headache or visual disturbances   Complete by: As directed    Diet - low sodium heart healthy   Complete by: As directed    Diet Carb Modified   Complete by: As directed    Discharge  instructions   Complete by: As directed    Follow with Primary MD in 1-2 weeks  Gastroenterologist's office will call you with a follow-up appointment-they will also set you up for a follow-up endoscopy to assess ulcer healing.  Your endoscopy biopsy results are pending-gastroenterology will call you with the results.  Stop all alcohol use  No further NSAID use (medications like Motrin, Aleve, naproxen, Advil etc.)  Please check your sugars multiple times a day-keep a record of these readings and take it with you to your next appointment with your primary care practitioner.   Please get a complete blood count and chemistry panel checked by your Primary MD at your next visit, and again as instructed by your Primary MD.  Get Medicines reviewed and adjusted: Please take all your medications with you for your next visit with your Primary MD  Laboratory/radiological data: Please request your Primary MD to go over all hospital tests and procedure/radiological results at the follow up, please ask your Primary MD to get all Hospital records sent to his/her office.  In some cases, they will be blood work, cultures and biopsy results pending at the time of your discharge. Please request that your primary care M.D. follows up on these results.  Also Note the following: If you experience worsening of your admission symptoms, develop shortness of breath, life threatening emergency, suicidal or homicidal thoughts you must seek medical attention immediately by calling 911 or calling your MD immediately  if symptoms less severe.  You must read complete instructions/literature along with all the possible adverse reactions/side effects for all the Medicines you take and that have been prescribed to you. Take any new Medicines after you have completely understood and accpet all the possible adverse reactions/side effects.   Do not drive when taking Pain medications or sleeping medications  (Benzodaizepines)  Do not take more than prescribed Pain, Sleep and Anxiety Medications. It is not advisable to combine anxiety,sleep and pain medications without talking with your primary care practitioner  Special Instructions: If you have smoked or chewed Tobacco  in the last 2 yrs please stop smoking, stop any regular Alcohol  and or any Recreational drug use.  Wear Seat belts while driving.  Please note: You were cared for by a hospitalist during your hospital stay. Once you are discharged, your primary care physician will handle any further medical issues. Please note that NO REFILLS for any discharge medications will be authorized once you are discharged, as it is imperative that you return to your primary care physician (or establish a relationship with a primary care physician if you do not have one) for your post hospital discharge needs so that they can reassess your need for medications and monitor your lab values.   Increase activity slowly   Complete by: As directed      Allergies as of 06/30/2020      Reactions   Penicillins Anaphylaxis      Medication List    STOP taking these medications   ibuprofen 600 MG tablet Commonly known as: ADVIL   predniSONE 10 MG tablet Commonly known as:  DELTASONE   triamterene-hydrochlorothiazide 37.5-25 MG tablet Commonly known as: MAXZIDE-25     TAKE these medications   Accu-Chek Guide test strip Generic drug: glucose blood Use as instructed   Accu-Chek Guide w/Device Kit Use as directed   Accu-Chek Softclix Lancets lancets Use as directed.   amLODipine 10 MG tablet Commonly known as: NORVASC Take 1 tablet (10 mg total) by mouth daily.   ferrous sulfate 325 (65 FE) MG EC tablet Take 1 tablet (325 mg total) by mouth daily with breakfast.   Levemir FlexTouch 100 UNIT/ML FlexPen Generic drug: insulin detemir Inject 7 Units into the skin daily.   lovastatin 20 MG tablet Commonly known as: MEVACOR Take 1 tablet (20 mg  total) by mouth at bedtime.   metFORMIN 500 MG tablet Commonly known as: Glucophage Take 1 tablet (500 mg total) by mouth at bedtime.   pantoprazole 40 MG tablet Commonly known as: PROTONIX Take 1 tablet (40 mg total) by mouth 2 (two) times daily before a meal. What changed:   medication strength  how much to take  when to take this   PenTips 32G X 4 MM Misc Generic drug: Insulin Pen Needle 1 each by Does not apply route daily.   traMADol 50 MG tablet Commonly known as: ULTRAM Take 1 tablet (50 mg total) by mouth every 12 (twelve) hours as needed for moderate pain or severe pain.       Follow-up Information    [facility]. Call.   Why: establish primary MD, they have pharmacy, and finacial councelling Contact information: [place] [id number] [facility] Follow up.   Specialty: Gastroenterology Why: Office will call you with a follow-up appointment in the next few days, if you do not hear from them-please give them a call. Contact information: [place] [id number] [phone]             Allergies  Allergen Reactions  . Penicillins Anaphylaxis    Consultations:   GI   Other Procedures/Studies: DG Thoracic Spine 2 View  Result Date: 06/27/2020 CLINICAL DATA:  56 year old male status post unwitnessed fall. Pain. EXAM: THORACIC SPINE 2 VIEWS COMPARISON:  Chest radiographs 06/26/2020, 12/14/2017. FINDINGS: Normal thoracic segmentation. Stable thoracic kyphosis. Normal vertebral height and alignment. Relatively preserved disc spaces. Cervicothoracic junction alignment is within normal limits. No acute osseous abnormality identified. Posterior ribs appear grossly intact. Stable visible chest and abdomen. IMPRESSION: No acute osseous abnormality identified in the thoracic spine. Electronically Signed   By: Genevie Ann M.D.   On:  06/27/2020 08:56   DG Lumbar Spine 2-3 Views  Result Date: 06/27/2020 CLINICAL DATA:  55 year old male status post unwitnessed fall. Pain. EXAM: LUMBAR SPINE - 2-3 VIEW COMPARISON:  Thoracic radiographs today. FINDINGS: Normal lumbar segmentation. Straightening of lumbar lordosis. No spondylolisthesis. Preserved lumbar vertebral height. Relatively preserved disc spaces. No acute osseous abnormality identified. Visible sacrum, SI joints and pelvis appear intact. Calcified abdominal aortic atherosclerosis. Negative other abdominal and pelvic visceral contours. IMPRESSION: 1. No acute osseous abnormality identified in the lumbar spine. 2.  Aortic Atherosclerosis (ICD10-I70.0). Electronically Signed   By: Genevie Ann M.D.   On: 06/27/2020 08:57   DG Shoulder Right  Result Date: 06/27/2020 CLINICAL DATA:  55 year old male status post unwitnessed fall. Pain. EXAM: RIGHT SHOULDER - 2+ VIEW COMPARISON:  Right shoulder series 05/10/2020. FINDINGS: No glenohumeral joint dislocation. Bone mineralization is within normal  limits. Intact proximal right humerus. Degenerative spurring at the inferior glenoid and right AC joint again noted. Visible right clavicle and scapula appear intact. No acute osseous abnormality identified. Negative visible right ribs and chest. IMPRESSION: No acute fracture or dislocation identified about the right shoulder. Electronically Signed   By: Genevie Ann M.D.   On: 06/27/2020 08:55   CT HEAD WO CONTRAST  Result Date: 06/27/2020 CLINICAL DATA:  Head trauma, moderate/severe. Fall. Neck pain, acute, no red flags. Additional history provided: Found down in ER by nurse. EXAM: CT HEAD WITHOUT CONTRAST CT CERVICAL SPINE WITHOUT CONTRAST TECHNIQUE: Multidetector CT imaging of the head and cervical spine was performed following the standard protocol without intravenous contrast. Multiplanar CT image reconstructions of the cervical spine were also generated. COMPARISON:  No pertinent prior exams available  for comparison. FINDINGS: CT HEAD FINDINGS Brain: Motion degraded exam. Cerebral volume is normal. There is no acute intracranial hemorrhage. No demarcated cortical infarct. No extra-axial fluid collection. No evidence of intracranial mass. No midline shift. Vascular: No hyperdense vessel Skull: Normal. Negative for fracture or focal lesion. Sinuses/Orbits: Visualized orbits show no acute finding. Trace mucosal thickening within the imaged right maxillary sinus. Other: Bilateral middle ear/mastoid effusions. A small left parietal scalp hematoma is questioned. CT CERVICAL SPINE FINDINGS Alignment: Leftward rotation of C1 upon C2. Straightening of the expected cervical lordosis. Mild cervical levocurvature. Trace C3-C4 and C4-C5 grade 1 anterolisthesis. Skull base and vertebrae: The basion-dental and atlanto-dental intervals are maintained.No evidence of acute fracture to the cervical spine. Soft tissues and spinal canal: No prevertebral fluid or swelling. No visible canal hematoma. Disc levels: Cervical spondylosis cervical spondylosis without high-grade bony spinal canal or neural foraminal narrowing. Notably, there is moderate facet arthrosis at C2-C3, C3-C4 and C4-C5. Upper chest: No consolidation within the imaged lung apices. No visible pneumothorax. 2 cm left apical bulla. IMPRESSION: CT head: 1. Motion degraded exam. 2. No evidence of acute intracranial abnormality. 3. A small left parietal scalp hematoma is questioned. 4. Bilateral middle ear/mastoid effusions. CT cervical spine: 1. No evidence of acute fracture to the cervical spine. 2. Leftward rotation of C1 upon C2, which may be related to patient head positioning at the time of image acquisition. Clinical correlation is recommended. 3. Mild C3-C4 and C4-C5 grade 1 anterolisthesis. 4. Mild cervical levocurvature. 5. Cervical spondylosis, as described. Electronically Signed   By: Kellie Simmering DO   On: 06/27/2020 09:19   CT CERVICAL SPINE WO  CONTRAST  Result Date: 06/27/2020 CLINICAL DATA:  Head trauma, moderate/severe. Fall. Neck pain, acute, no red flags. Additional history provided: Found down in ER by nurse. EXAM: CT HEAD WITHOUT CONTRAST CT CERVICAL SPINE WITHOUT CONTRAST TECHNIQUE: Multidetector CT imaging of the head and cervical spine was performed following the standard protocol without intravenous contrast. Multiplanar CT image reconstructions of the cervical spine were also generated. COMPARISON:  No pertinent prior exams available for comparison. FINDINGS: CT HEAD FINDINGS Brain: Motion degraded exam. Cerebral volume is normal. There is no acute intracranial hemorrhage. No demarcated cortical infarct. No extra-axial fluid collection. No evidence of intracranial mass. No midline shift. Vascular: No hyperdense vessel Skull: Normal. Negative for fracture or focal lesion. Sinuses/Orbits: Visualized orbits show no acute finding. Trace mucosal thickening within the imaged right maxillary sinus. Other: Bilateral middle ear/mastoid effusions. A small left parietal scalp hematoma is questioned. CT CERVICAL SPINE FINDINGS Alignment: Leftward rotation of C1 upon C2. Straightening of the expected cervical lordosis. Mild cervical levocurvature. Trace C3-C4 and C4-C5 grade  1 anterolisthesis. Skull base and vertebrae: The basion-dental and atlanto-dental intervals are maintained.No evidence of acute fracture to the cervical spine. Soft tissues and spinal canal: No prevertebral fluid or swelling. No visible canal hematoma. Disc levels: Cervical spondylosis cervical spondylosis without high-grade bony spinal canal or neural foraminal narrowing. Notably, there is moderate facet arthrosis at C2-C3, C3-C4 and C4-C5. Upper chest: No consolidation within the imaged lung apices. No visible pneumothorax. 2 cm left apical bulla. IMPRESSION: CT head: 1. Motion degraded exam. 2. No evidence of acute intracranial abnormality. 3. A small left parietal scalp hematoma  is questioned. 4. Bilateral middle ear/mastoid effusions. CT cervical spine: 1. No evidence of acute fracture to the cervical spine. 2. Leftward rotation of C1 upon C2, which may be related to patient head positioning at the time of image acquisition. Clinical correlation is recommended. 3. Mild C3-C4 and C4-C5 grade 1 anterolisthesis. 4. Mild cervical levocurvature. 5. Cervical spondylosis, as described. Electronically Signed   By: Kellie Simmering DO   On: 06/27/2020 09:19   DG Chest Port 1 View  Result Date: 06/26/2020 CLINICAL DATA:  Short of breath, malaise EXAM: PORTABLE CHEST 1 VIEW COMPARISON:  12/14/2017 FINDINGS: The heart size and mediastinal contours are within normal limits. Both lungs are clear. The visualized skeletal structures are unremarkable. IMPRESSION: No active disease. Electronically Signed   By: Franchot Gallo M.D.   On: 06/26/2020 16:41     TODAY-DAY OF DISCHARGE:  Subjective:   Jose Morrow today has no headache,no chest abdominal pain,no new weakness tingling or numbness, feels much better wants to go home today.   Objective:   Blood pressure 120/81, pulse 64, temperature 98.7 F (37.1 C), temperature source Oral, resp. rate 16, height _0  (1.753 m), weight 83.5 kg, SpO2 100 %.  Intake/Output Summary (Last 24 hours) at 06/30/2020 1003 Last data filed at 06/29/2020 2318 Gross per 24 hour  Intake 1668.67 ml  Output 310 ml  Net 1358.67 ml   Filed Weights   06/26/20 1608  Weight: 83.5 kg    Exam: Awake Alert, Oriented *3, No new F.N deficits, Normal affect [place].AT,PERRAL Supple Neck,No JVD, No cervical lymphadenopathy appriciated.  Symmetrical Chest wall movement, Good air movement bilaterally, CTAB RRR,No Gallops,Rubs or new Murmurs, No Parasternal Heave +ve B.Sounds, Abd Soft, Non tender, No organomegaly appriciated, No rebound -guarding or rigidity. No Cyanosis, Clubbing or edema, No new Rash or bruise   PERTINENT RADIOLOGIC STUDIES: No results  found.   PERTINENT LAB RESULTS: CBC: Recent Labs    06/29/20 0247 06/30/20 0033  WBC 7.5 7.2  HGB 7.8* 7.5*  HCT 22.1* 21.5*  PLT 195 208   CMET CMP     Component Value Date/Time   NA 137 06/30/2020 0033   K 3.2 (L) 06/30/2020 0033   CL 106 06/30/2020 0033   CO2 25 06/30/2020 0033   GLUCOSE 99 06/30/2020 0033   BUN 14 06/30/2020 0033   CREATININE 1.12 06/30/2020 0033   CALCIUM 7.7 (L) 06/30/2020 0033   PROT 4.0 (L) 06/27/2020 0407   ALBUMIN 2.3 (L) 06/27/2020 0407   AST 13 (L) 06/27/2020 0407   ALT 8 06/27/2020 0407   ALKPHOS 30 (L) 06/27/2020 0407   BILITOT 0.5 06/27/2020 0407   GFRNONAA >60 06/30/2020 0033   GFRAA >60 12/14/2017 0222    GFR Estimated Creatinine Clearance: 75.4 mL/min (by C-G formula based on SCr of 1.12 mg/dL). No results for input(s): LIPASE, AMYLASE in the last 72 hours. No results for input(s): CKTOTAL,  CKMB, CKMBINDEX, TROPONINI in the last 72 hours. Invalid input(s): POCBNP No results for input(s): DDIMER in the last 72 hours. No results for input(s): HGBA1C in the last 72 hours. No results for input(s): CHOL, HDL, LDLCALC, TRIG, CHOLHDL, LDLDIRECT in the last 72 hours. No results for input(s): TSH, T4TOTAL, T3FREE, THYROIDAB in the last 72 hours.  Invalid input(s): FREET3 No results for input(s): VITAMINB12, FOLATE, FERRITIN, TIBC, IRON, RETICCTPCT in the last 72 hours. Coags: No results for input(s): INR in the last 72 hours.  Invalid input(s): PT Microbiology: Recent Results (from the past 240 hour(s))  Resp Panel by RT-PCR (Flu A&B, Covid) Nasopharyngeal Swab     Status: None   Collection Time: 06/26/20  8:10 PM   Specimen: Nasopharyngeal Swab; Nasopharyngeal(NP) swabs in vial transport medium  Result Value Ref Range Status   SARS Coronavirus 2 by RT PCR NEGATIVE NEGATIVE Final    Comment: (NOTE) SARS-CoV-2 target nucleic acids are NOT DETECTED.  The SARS-CoV-2 RNA is generally detectable in upper respiratory specimens during  the acute phase of infection. The lowest concentration of SARS-CoV-2 viral copies this assay can detect is 138 copies/mL. A negative result does not preclude SARS-Cov-2 infection and should not be used as the sole basis for treatment or other patient management decisions. A negative result may occur with  improper specimen collection/handling, submission of specimen other than nasopharyngeal swab, presence of viral mutation(s) within the areas targeted by this assay, and inadequate number of viral copies(<138 copies/mL). A negative result must be combined with clinical observations, patient history, and epidemiological information. The expected result is Negative.  Fact Sheet for Patients:  EntrepreneurPulse.com.au  Fact Sheet for Healthcare Providers:  IncredibleEmployment.be  This test is no t yet approved or cleared by the [place] FDA and  has been authorized for detection and/or diagnosis of SARS-CoV-2 by FDA under an Emergency Use Authorization (EUA). This EUA will remain  in effect (meaning this test can be used) for the duration of the COVID-19 declaration under Section 564(b)(1) of the Act, 21 U.S.C.section 360bbb-3(b)(1), unless the authorization is terminated  or revoked sooner.       Influenza A by PCR NEGATIVE NEGATIVE Final   Influenza B by PCR NEGATIVE NEGATIVE Final    Comment: (NOTE) The Xpert Xpress SARS-CoV-2/FLU/RSV plus assay is intended as an aid in the diagnosis of influenza from Nasopharyngeal swab specimens and should not be used as a sole basis for treatment. Nasal washings and aspirates are unacceptable for Xpert Xpress SARS-CoV-2/FLU/RSV testing.  Fact Sheet for Patients: EntrepreneurPulse.com.au  Fact Sheet for Healthcare Providers: IncredibleEmployment.be  This test is not yet approved or cleared by the [place] FDA and has been authorized for detection and/or  diagnosis of SARS-CoV-2 by FDA under an Emergency Use Authorization (EUA). This EUA will remain in effect (meaning this test can be used) for the duration of the COVID-19 declaration under Section 564(b)(1) of the Act, 21 U.S.C. section 360bbb-3(b)(1), unless the authorization is terminated or revoked.  Performed at [facility], [place] [address], [place], [place] [address]     FURTHER DISCHARGE INSTRUCTIONS:  Get Medicines reviewed and adjusted: Please take all your medications with you for your next visit with your Primary MD  Laboratory/radiological data: Please request your Primary MD to go over all hospital tests and procedure/radiological results at the follow up, please ask your Primary MD to get all Hospital records sent to his/her office.  In some cases, they will be blood work, cultures  and biopsy results pending at the time of your discharge. Please request that your primary care M.D. goes through all the records of your hospital data and follows up on these results.  Also Note the following: If you experience worsening of your admission symptoms, develop shortness of breath, life threatening emergency, suicidal or homicidal thoughts you must seek medical attention immediately by calling 911 or calling your MD immediately  if symptoms less severe.  You must read complete instructions/literature along with all the possible adverse reactions/side effects for all the Medicines you take and that have been prescribed to you. Take any new Medicines after you have completely understood and accpet all the possible adverse reactions/side effects.   Do not drive when taking Pain medications or sleeping medications (Benzodaizepines)  Do not take more than prescribed Pain, Sleep and Anxiety Medications. It is not advisable to combine anxiety,sleep and pain medications without talking with your primary care practitioner  Special Instructions: If you have smoked or chewed Tobacco  in  the last 2 yrs please stop smoking, stop any regular Alcohol  and or any Recreational drug use.  Wear Seat belts while driving.  Please note: You were cared for by a hospitalist during your hospital stay. Once you are discharged, your primary care physician will handle any further medical issues. Please note that NO REFILLS for any discharge medications will be authorized once you are discharged, as it is imperative that you return to your primary care physician (or establish a relationship with a primary care physician if you do not have one) for your post hospital discharge needs so that they can reassess your need for medications and monitor your lab values.  Total Time spent coordinating discharge including counseling, education and face to face time equals 35 minutes.  SignedOren Binet 06/30/2020 10:03 AM

## 2020-06-30 NOTE — TOC Transition Note (Signed)
Transition of Care [facility]) - CM/SW Discharge Note   Patient Details  Name: Jose Morrow MRN: [id number] Date of Birth: 1965/05/26  Transition of Care [facility]) CM/SW Contact:  Lawerance Sabal, RN Phone Number: 06/30/2020, 10:22 AM   Clinical Narrative:   DC meds filled by [facility] pharmacy, messaged nurse to make sure he gets sent home with them. [facility] added to AVS. Patient to call to establish primary care.       Barriers to Discharge: Continued Medical Work up,Inadequate or no insurance

## 2020-06-30 NOTE — Progress Notes (Signed)
Patient discharged home.  Discharge instructions explained and patient verbalizes understanding.  Medications given to patient.  Work letter given to patient.

## 2020-07-03 LAB — SURGICAL PATHOLOGY

## 2020-07-12 ENCOUNTER — Ambulatory Visit (INDEPENDENT_AMBULATORY_CARE_PROVIDER_SITE_OTHER): Payer: Self-pay | Admitting: Primary Care

## 2020-08-04 ENCOUNTER — Other Ambulatory Visit (HOSPITAL_COMMUNITY): Payer: Self-pay

## 2020-10-15 LAB — HM DIABETES EYE EXAM

## 2020-10-29 ENCOUNTER — Telehealth: Payer: Self-pay | Admitting: Internal Medicine

## 2020-10-29 ENCOUNTER — Ambulatory Visit (INDEPENDENT_AMBULATORY_CARE_PROVIDER_SITE_OTHER): Payer: 59 | Admitting: Internal Medicine

## 2020-10-29 ENCOUNTER — Encounter: Payer: Self-pay | Admitting: Internal Medicine

## 2020-10-29 ENCOUNTER — Other Ambulatory Visit: Payer: Self-pay

## 2020-10-29 VITALS — BP 186/104 | HR 74 | Temp 98.3°F | Resp 16 | Ht 69.0 in | Wt 184.0 lb

## 2020-10-29 DIAGNOSIS — E113213 Type 2 diabetes mellitus with mild nonproliferative diabetic retinopathy with macular edema, bilateral: Secondary | ICD-10-CM | POA: Diagnosis not present

## 2020-10-29 DIAGNOSIS — Z720 Tobacco use: Secondary | ICD-10-CM

## 2020-10-29 DIAGNOSIS — I10 Essential (primary) hypertension: Secondary | ICD-10-CM | POA: Diagnosis not present

## 2020-10-29 DIAGNOSIS — E1165 Type 2 diabetes mellitus with hyperglycemia: Secondary | ICD-10-CM | POA: Diagnosis not present

## 2020-10-29 DIAGNOSIS — D500 Iron deficiency anemia secondary to blood loss (chronic): Secondary | ICD-10-CM

## 2020-10-29 DIAGNOSIS — Z794 Long term (current) use of insulin: Secondary | ICD-10-CM

## 2020-10-29 DIAGNOSIS — Z1211 Encounter for screening for malignant neoplasm of colon: Secondary | ICD-10-CM

## 2020-10-29 DIAGNOSIS — E119 Type 2 diabetes mellitus without complications: Secondary | ICD-10-CM

## 2020-10-29 DIAGNOSIS — M25511 Pain in right shoulder: Secondary | ICD-10-CM

## 2020-10-29 DIAGNOSIS — Z23 Encounter for immunization: Secondary | ICD-10-CM

## 2020-10-29 DIAGNOSIS — G8929 Other chronic pain: Secondary | ICD-10-CM

## 2020-10-29 DIAGNOSIS — D649 Anemia, unspecified: Secondary | ICD-10-CM

## 2020-10-29 DIAGNOSIS — E785 Hyperlipidemia, unspecified: Secondary | ICD-10-CM | POA: Diagnosis not present

## 2020-10-29 LAB — BASIC METABOLIC PANEL
BUN: 14 mg/dL (ref 6–23)
CO2: 27 mEq/L (ref 19–32)
Calcium: 8.8 mg/dL (ref 8.4–10.5)
Chloride: 104 mEq/L (ref 96–112)
Creatinine, Ser: 1.47 mg/dL (ref 0.40–1.50)
GFR: 53.64 mL/min — ABNORMAL LOW (ref >60.00)
Glucose, Bld: 240 mg/dL — ABNORMAL HIGH (ref 70–99)
Potassium: 3.7 mEq/L (ref 3.5–5.1)
Sodium: 138 mEq/L (ref 135–145)

## 2020-10-29 LAB — CBC WITH DIFFERENTIAL/PLATELET
Basophils Absolute: 0 10*3/uL (ref 0.0–0.1)
Basophils Relative: 0.8 % (ref 0.0–3.0)
Eosinophils Absolute: 0.1 10*3/uL (ref 0.0–0.7)
Eosinophils Relative: 2.1 % (ref 0.0–5.0)
HCT: 37.4 % — ABNORMAL LOW (ref 39.0–52.0)
Hemoglobin: 11.9 g/dL — ABNORMAL LOW (ref 13.0–17.0)
Lymphocytes Relative: 30 % (ref 12.0–46.0)
Lymphs Abs: 1.8 10*3/uL (ref 0.7–4.0)
MCHC: 31.8 g/dL (ref 30.0–36.0)
MCV: 65.6 fl — ABNORMAL LOW (ref 78.0–100.0)
Monocytes Absolute: 0.5 10*3/uL (ref 0.1–1.0)
Monocytes Relative: 8.1 % (ref 3.0–12.0)
Neutro Abs: 3.6 10*3/uL (ref 1.4–7.7)
Neutrophils Relative %: 59 % (ref 43.0–77.0)
Platelets: 196 10*3/uL (ref 150.0–400.0)
RBC: 5.71 Mil/uL (ref 4.22–5.81)
RDW: 19.5 % — ABNORMAL HIGH (ref 11.5–15.5)
WBC: 6.1 10*3/uL (ref 4.0–10.5)

## 2020-10-29 LAB — URINALYSIS, ROUTINE W REFLEX MICROSCOPIC
Bilirubin Urine: NEGATIVE
Leukocytes,Ua: NEGATIVE
Nitrite: NEGATIVE
Specific Gravity, Urine: >=1.03 — AB (ref 1.000–1.030)
Total Protein, Urine: NEGATIVE
Urine Glucose: >=1000 — AB
Urobilinogen, UA: 0.2 (ref 0.0–1.0)
pH: 6 (ref 5.0–8.0)

## 2020-10-29 LAB — LIPID PANEL
Cholesterol: 149 mg/dL (ref 0–200)
HDL: 45.8 mg/dL (ref >39.00)
NonHDL: 102.84
Total CHOL/HDL Ratio: 3
Triglycerides: 303 mg/dL — ABNORMAL HIGH (ref 0.0–149.0)
VLDL: 60.6 mg/dL — ABNORMAL HIGH (ref 0.0–40.0)

## 2020-10-29 LAB — HEPATIC FUNCTION PANEL
ALT: 6 U/L (ref 0–53)
AST: 12 U/L (ref 0–37)
Albumin: 3.8 g/dL (ref 3.5–5.2)
Alkaline Phosphatase: 48 U/L (ref 39–117)
Bilirubin, Direct: 0.1 mg/dL (ref 0.0–0.3)
Total Bilirubin: 0.4 mg/dL (ref 0.2–1.2)
Total Protein: 6.7 g/dL (ref 6.0–8.3)

## 2020-10-29 LAB — VITAMIN B12: Vitamin B-12: 303 pg/mL (ref 211–911)

## 2020-10-29 LAB — IBC + FERRITIN
Ferritin: 5.5 ng/mL — ABNORMAL LOW (ref 22.0–322.0)
Iron: 31 ug/dL — ABNORMAL LOW (ref 42–165)
Saturation Ratios: 6.6 % — ABNORMAL LOW (ref 20.0–50.0)
TIBC: 467.6 ug/dL — ABNORMAL HIGH (ref 250.0–450.0)
Transferrin: 334 mg/dL (ref 212.0–360.0)

## 2020-10-29 LAB — MICROALBUMIN / CREATININE URINE RATIO
Creatinine,U: 268 mg/dL
Microalb Creat Ratio: 2.1 mg/g (ref 0.0–30.0)
Microalb, Ur: 5.7 mg/dL — ABNORMAL HIGH (ref 0.0–1.9)

## 2020-10-29 LAB — TSH: TSH: 1.03 u[IU]/mL (ref 0.35–5.50)

## 2020-10-29 LAB — HEMOGLOBIN A1C: Hgb A1c MFr Bld: 11.3 % — ABNORMAL HIGH (ref 4.6–6.5)

## 2020-10-29 LAB — LDL CHOLESTEROL, DIRECT: Direct LDL: 77 mg/dL

## 2020-10-29 LAB — FOLATE: Folate: 9.6 ng/mL (ref >5.9)

## 2020-10-29 MED ORDER — INSULIN DETEMIR 100 UNIT/ML FLEXPEN: 50.0000 [IU] | PEN_INJECTOR | Freq: Every day | SUBCUTANEOUS | 0 refills | Status: DC

## 2020-10-29 MED ORDER — GVOKE HYPOPEN 2-PACK 1 MG/0.2ML ~~LOC~~ SOAJ: 1.0000 | Freq: Every day | SUBCUTANEOUS | 5 refills | Status: DC | PRN

## 2020-10-29 MED ORDER — ACCRUFER 30 MG PO CAPS: 1.0000 | ORAL_CAPSULE | Freq: Two times a day (BID) | ORAL | 0 refills | Status: DC

## 2020-10-29 MED ORDER — HUMALOG KWIKPEN 200 UNIT/ML ~~LOC~~ SOPN: 15.0000 [IU] | PEN_INJECTOR | Freq: Three times a day (TID) | SUBCUTANEOUS | 0 refills | Status: DC

## 2020-10-29 MED ORDER — TRIAMTERENE-HCTZ 37.5-25 MG PO CAPS: 1.0000 | ORAL_CAPSULE | Freq: Every day | ORAL | 0 refills | Status: DC

## 2020-10-29 MED ORDER — AMLODIPINE BESYLATE 10 MG PO TABS: 10.0000 mg | ORAL_TABLET | Freq: Every day | ORAL | 0 refills | Status: DC

## 2020-10-29 MED ORDER — FREESTYLE LIBRE 2 SENSOR MISC: 1.0000 | Freq: Every day | 5 refills | Status: DC

## 2020-10-29 MED ORDER — METFORMIN HCL ER 750 MG PO TB24: 750.0000 mg | ORAL_TABLET | Freq: Every day | ORAL | 0 refills | Status: DC

## 2020-10-29 MED ORDER — INSULIN PEN NEEDLE 32G X 4 MM MISC: 1.0000 | Freq: Three times a day (TID) | 1 refills | Status: DC

## 2020-10-29 MED ORDER — FREESTYLE LIBRE 2 READER DEVI: 1.0000 | Freq: Every day | 5 refills | Status: DC

## 2020-10-29 NOTE — Telephone Encounter (Signed)
1.Medication Requested: insulin detemir (LEVEMIR) 100 UNIT/ML FlexPen  2. Pharmacy (Name, Street, [place]): [id number] - [place] ([place]), [place] - [address] Phone:  [phone]  Fax:  [phone]     3. On Med List: Y  4. Last Visit with PCP: 10/29/2020  5. Next visit date with PCP: n/a    Agent: Please be advised that RX refills may take up to 3 business days. We ask that you follow-up with your pharmacy.

## 2020-10-29 NOTE — Progress Notes (Signed)
Subjective:  Patient ID: Jose Morrow, male    DOB: 20-Nov-1965  Age: 55 y.o. MRN: [id number]  CC: Hypertension, Anemia, Hyperlipidemia, and Diabetes  This visit occurred during the SARS-CoV-2 public health emergency.  Safety protocols were in place, including screening questions prior to the visit, additional usage of staff PPE, and extensive cleaning of exam room while observing appropriate contact time as indicated for disinfecting solutions.    HPI Jose Morrow presents for establishing.  About 4 months ago he experienced right shoulder pain and was treating it with ibuprofen.  He was later admitted to the hospital for an upper GI bleed.  He has stopped taking ibuprofen but continues to complain of right shoulder pain.  He also has a history of hypertension and diabetes.  He complains of headache, blurred vision, dry mouth, and polys.  He denies chest pain, shortness of breath, diaphoresis, dizziness, or lightheadedness.  History Jose Morrow has a past medical history of Diabetes mellitus and Hypertension.   He has a past surgical history that includes Esophagogastroduodenoscopy (egd) with propofol (N/A, 06/28/2020) and biopsy (06/28/2020).   His family history is not on file.He reports that he has been smoking cigarettes. He has a 60.00 pack-year smoking history. He has never used smokeless tobacco. He reports that he does not drink alcohol and does not use drugs.  Outpatient Medications Prior to Visit  Medication Sig Dispense Refill   Accu-Chek Softclix Lancets lancets Use as directed. 100 each 5   Blood Glucose Monitoring Suppl (ACCU-CHEK GUIDE) w/Device KIT Use as directed 1 kit 0   glucose blood (ACCU-CHEK GUIDE) test strip Use as instructed 100 each 12   traMADol (ULTRAM) 50 MG tablet Take 1 tablet (50 mg total) by mouth every 12 (twelve) hours as needed for moderate pain or severe pain. 12 tablet 0   amLODipine (NORVASC) 10 MG tablet Take 1 tablet (10 mg total) by mouth daily. 30 tablet  11   Insulin Pen Needle 32G X 4 MM MISC 1 each by Does not apply route daily. 100 each 0   lovastatin (MEVACOR) 20 MG tablet Take 1 tablet (20 mg total) by mouth at bedtime. 30 tablet 3   pantoprazole (PROTONIX) 40 MG tablet Take 1 tablet (40 mg total) by mouth 2 (two) times daily before a meal. 60 tablet 1   ferrous sulfate 325 (65 FE) MG EC tablet Take 1 tablet (325 mg total) by mouth daily with breakfast. 30 tablet 0   insulin detemir (LEVEMIR) 100 UNIT/ML FlexPen Inject 7 Units into the skin daily. 3 mL 0   metFORMIN (GLUCOPHAGE) 500 MG tablet Take 1 tablet (500 mg total) by mouth at bedtime. 30 tablet 11   No facility-administered medications prior to visit.    ROS Review of Systems  Constitutional:  Negative for chills, diaphoresis, fatigue and fever.  HENT: Negative.    Eyes:  Positive for visual disturbance. Negative for photophobia.  Respiratory:  Negative for cough, chest tightness, shortness of breath and wheezing.   Cardiovascular:  Negative for chest pain, palpitations and leg swelling.  Gastrointestinal:  Negative for abdominal pain, constipation, diarrhea and nausea.  Endocrine: Positive for polydipsia, polyphagia and polyuria.  Genitourinary: Negative.  Negative for difficulty urinating, dysuria and hematuria.  Musculoskeletal: Negative.  Negative for back pain, joint swelling and myalgias.  Skin: Negative.   Neurological:  Positive for headaches. Negative for dizziness, weakness, light-headedness and numbness.  Hematological:  Negative for adenopathy. Does not bruise/bleed easily.  Psychiatric/Behavioral: Negative.  Objective:  BP (!) 186/104 (BP Location: Left Arm, Patient Position: Sitting, Cuff Size: Large)   Pulse 74   Temp 98.3 F (36.8 C) (Oral)   Resp 16   Ht 5' 9"  (1.753 m)   Wt 184 lb (83.5 kg)   SpO2 98%   BMI 27.17 kg/m   Physical Exam Vitals reviewed.  Cardiovascular:     Rate and Rhythm: Normal rate and regular rhythm.     Heart sounds:  Normal heart sounds, S1 normal and S2 normal. No murmur heard.    Comments: EKG- NSR, 63 bpm +LVH with early repol No Q waves Musculoskeletal:     Right lower leg: Pitting Edema (trace) present.     Left lower leg: Pitting Edema (trace) present.    Lab Results  Component Value Date   WBC 6.1 10/29/2020   HGB 11.9 (L) 10/29/2020   HCT 37.4 (L) 10/29/2020   PLT 196.0 10/29/2020   GLUCOSE 240 (H) 10/29/2020   CHOL 149 10/29/2020   TRIG 303.0 (H) 10/29/2020   HDL 45.80 10/29/2020   LDLDIRECT 77.0 10/29/2020   LDLCALC  11/09/2009    97        Total Cholesterol/HDL:CHD Risk Coronary Heart Disease Risk Table                     Men   Women  1/2 Average Risk   3.4   3.3  Average Risk       5.0   4.4  2 X Average Risk   9.6   7.1  3 X Average Risk  23.4   11.0        Use the calculated Patient Ratio above and the CHD Risk Table to determine the patient's CHD Risk.        ATP III CLASSIFICATION (LDL):  <100     mg/dL   Optimal  100-129  mg/dL   Near or Above                    Optimal  130-159  mg/dL   Borderline  160-189  mg/dL   High  >190     mg/dL   Very High   ALT 6 10/29/2020   AST 12 10/29/2020   NA 138 10/29/2020   K 3.7 10/29/2020   CL 104 10/29/2020   CREATININE 1.47 10/29/2020   BUN 14 10/29/2020   CO2 27 10/29/2020   TSH 1.03 10/29/2020   INR 1.2 06/27/2020   HGBA1C 11.3 (H) 10/29/2020   MICROALBUR 5.7 (H) 10/29/2020     Assessment & Plan:   Jose Morrow was seen today for hypertension, anemia, hyperlipidemia and diabetes.  Diagnoses and all orders for this visit:  Uncontrolled type 2 diabetes mellitus with hyperglycemia ([place])- His A1c is 11.3%.  I recommend that he start taking metformin and start using basal/bolus insulin. -     HM Diabetes Foot Exam -     CBC with Differential/Platelet; Future -     Microalbumin / creatinine urine ratio; Future -     Urinalysis, Routine w reflex microscopic; Future -     Hemoglobin A1c; Future -     metFORMIN  (GLUCOPHAGE XR) 750 MG 24 hr tablet; Take 1 tablet (750 mg total) by mouth daily with breakfast. -     Hemoglobin A1c -     Urinalysis, Routine w reflex microscopic -     Microalbumin / creatinine urine ratio -     CBC  with Differential/Platelet -     insulin detemir (LEVEMIR) 100 UNIT/ML FlexPen; Inject 50 Units into the skin daily. -     Ambulatory referral to Endocrinology -     Amb Referral to Nutrition and Diabetic Education -     Consult to [facility] -     Glucagon (GVOKE HYPOPEN 2-PACK) 1 MG/0.2ML SOAJ; Inject 1 Act into the skin daily as needed. -     Continuous Blood Gluc Receiver (FREESTYLE LIBRE 2 READER) DEVI; 1 Act by Does not apply route daily. -     Continuous Blood Gluc Sensor (FREESTYLE LIBRE 2 SENSOR) MISC; 1 Act by Does not apply route daily. -     insulin lispro (HUMALOG KWIKPEN) 200 UNIT/ML KwikPen; Inject 15 Units into the skin with breakfast, with lunch, and with evening meal. -     Insulin Pen Needle 32G X 4 MM MISC; 1 each by Does not apply route 4 (four) times daily -  with meals and at bedtime.  Hypertension, unspecified type- He has malignant hypertension.  I recommended that he undergo an abd MRA to screen for renal artery stenosis.  Will treat this with amlodipine, triamterene, and hydrochlorothiazide.  Will check labs to screen for secondary causes and endorgan damage. -     amLODipine (NORVASC) 10 MG tablet; Take 1 tablet (10 mg total) by mouth daily. -     Basic metabolic panel; Future -     Aldosterone + renin activity w/ ratio; Future -     TSH; Future -     Urinalysis, Routine w reflex microscopic; Future -     Hepatic function panel; Future -     EKG 12-Lead -     Hepatic function panel -     Urinalysis, Routine w reflex microscopic -     TSH -     Aldosterone + renin activity w/ ratio -     Basic metabolic panel -     triamterene-hydrochlorothiazide (DYAZIDE) 37.5-25 MG capsule; Take 1 each (1 capsule total) by mouth daily. -     MR ANGIO  ABDOMEN W WO CONTRAST; Future  Symptomatic anemia-I will screen for vitamin deficiencies. -     CBC with Differential/Platelet; Future -     Vitamin B12; Future -     IBC + Ferritin; Future -     Vitamin B1; Future -     Folate; Future -     Folate -     Vitamin B1 -     IBC + Ferritin -     Vitamin B12 -     CBC with Differential/Platelet  Hyperlipidemia with target LDL less than 100-recommended that he take a statin for the risk reduction. 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 -     Hemoglobin A1c  Continuous tobacco abuse -     Ambulatory Referral for Lung Cancer Scre  Chronic right shoulder pain -     Ambulatory referral to Sports Medicine  Need for vaccination -     Pneumococcal conjugate vaccine  20-valent (Prevnar 20)  Iron deficiency anemia due to chronic blood loss -     Ferric Maltol (ACCRUFER) 30 MG CAPS; Take 1 capsule by mouth in the morning and at bedtime.  Insulin-requiring or dependent type II diabetes mellitus (HCC) -     insulin detemir (LEVEMIR) 100 UNIT/ML FlexPen; Inject 50 Units into the skin daily. -     Ambulatory referral to Endocrinology -     Amb Referral to Nutrition and Diabetic Education -     Consult to [facility] -     Glucagon (GVOKE HYPOPEN 2-PACK) 1 MG/0.2ML SOAJ; Inject 1 Act into the skin daily as needed. -     Continuous Blood Gluc Receiver (FREESTYLE LIBRE 2 READER) DEVI; 1 Act by Does not apply route daily. -     Continuous Blood Gluc Sensor (FREESTYLE LIBRE 2 SENSOR) MISC; 1 Act by Does not apply route daily. -     insulin lispro (HUMALOG KWIKPEN) 200 UNIT/ML KwikPen; Inject 15 Units into the skin with  breakfast, with lunch, and with evening meal. -     Insulin Pen Needle 32G X 4 MM MISC; 1 each by Does not apply route 4 (four) times daily -  with meals and at bedtime.  Screen for colon cancer -     Ambulatory referral to Gastroenterology  Other orders -     Tdap vaccine greater than or equal to 7yo IM -     Cancel: Pneumococcal conjugate vaccine 13-valent -     LDL cholesterol, direct  I have discontinued Nicole Kindred L. Mcdill's lovastatin, metFORMIN, and ferrous sulfate. I have also changed his insulin detemir and Insulin Pen Needle. Additionally, I am having him start on metFORMIN, ACCRUFeR, Gvoke HypoPen 2-Pack, FreeStyle Libre 2 Reader, [employer] 2 Sensor, HumaLOG KwikPen, and triamterene-hydrochlorothiazide. Lastly, I am having him maintain his traMADol, pantoprazole, Accu-Chek Softclix Lancets, Accu-Chek Guide, Accu-Chek Guide, and amLODipine.  Meds ordered this encounter  Medications   amLODipine (NORVASC) 10 MG tablet    Sig: Take 1 tablet (10 mg total) by mouth daily.    Dispense:  90 tablet    Refill:  0   metFORMIN (GLUCOPHAGE XR) 750 MG 24 hr tablet    Sig: Take 1 tablet (750 mg total) by mouth daily with breakfast.    Dispense:  90 tablet    Refill:  0   Ferric Maltol (ACCRUFER) 30 MG CAPS    Sig: Take 1 capsule by mouth in the morning and at bedtime.    Dispense:  180 capsule    Refill:  0   insulin detemir (LEVEMIR) 100 UNIT/ML FlexPen    Sig: Inject 50 Units into the skin daily.    Dispense:  45 mL    Refill:  0   Glucagon (GVOKE HYPOPEN 2-PACK) 1 MG/0.2ML SOAJ    Sig: Inject 1 Act into the skin daily as needed.    Dispense:  2 mL    Refill:  5   Continuous Blood Gluc Receiver (FREESTYLE LIBRE 2 READER) DEVI    Sig: 1 Act by Does not apply route daily.    Dispense:  2 each    Refill:  5   Continuous Blood Gluc Sensor (FREESTYLE LIBRE 2 SENSOR) MISC    Sig: 1 Act by Does not apply route daily.    Dispense:  2 each    Refill:  5   insulin lispro (HUMALOG  KWIKPEN) 200 UNIT/ML KwikPen    Sig: Inject 15 Units into  the skin with breakfast, with lunch, and with evening meal.    Dispense:  21 mL    Refill:  0   Insulin Pen Needle 32G X 4 MM MISC    Sig: 1 each by Does not apply route 4 (four) times daily -  with meals and at bedtime.    Dispense:  300 each    Refill:  1   triamterene-hydrochlorothiazide (DYAZIDE) 37.5-25 MG capsule    Sig: Take 1 each (1 capsule total) by mouth daily.    Dispense:  90 capsule    Refill:  0      Follow-up: Return in about 4 weeks (around 11/26/2020).  Scarlette Calico, MD

## 2020-10-29 NOTE — Patient Instructions (Signed)

## 2020-10-30 ENCOUNTER — Encounter: Payer: Self-pay | Admitting: Internal Medicine

## 2020-10-30 ENCOUNTER — Other Ambulatory Visit: Payer: Self-pay

## 2020-10-30 DIAGNOSIS — Z794 Long term (current) use of insulin: Secondary | ICD-10-CM | POA: Insufficient documentation

## 2020-10-30 DIAGNOSIS — Z1211 Encounter for screening for malignant neoplasm of colon: Secondary | ICD-10-CM | POA: Insufficient documentation

## 2020-10-30 DIAGNOSIS — E119 Type 2 diabetes mellitus without complications: Secondary | ICD-10-CM | POA: Insufficient documentation

## 2020-10-30 DIAGNOSIS — E1165 Type 2 diabetes mellitus with hyperglycemia: Secondary | ICD-10-CM

## 2020-10-30 MED ORDER — ATORVASTATIN CALCIUM 20 MG PO TABS: 20.0000 mg | ORAL_TABLET | Freq: Every day | ORAL | 3 refills | Status: DC

## 2020-10-30 NOTE — Progress Notes (Signed)
I, Jose Morrow, LAT, ATC acting as a scribe for Jose Graham, MD.  Subjective:    CC: R shoulder pain  HPI: Pt is a 55 y/o male c/o R shoulder pain ongoing since end of Feb. Of noted, pt was seen in the ED c/o R shoulder pain on 05/10/20 and 06/24/20 and UC on 05/01/20 and 05/08/20. MOI: Pt was taking up some flooring and "jarred" his R arm and shoulder. Pt locates pain to lateral aspect of upper arm, into superior aspect of GH and upper back. Pt reports an incident from taking too much pain medication where he had to be hospitalized on 06/26/20. Pt c/o increased pain when trying to sleep at night. 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   Radiates: yes Mechanical symptoms: yes UE Numbness/tingling: yes- down to hand/fingers UE Weakness: yes Aggravates: side-lying, shoulder ABD Treatments tried: muscle relaxer, NSAIDs, Toradol IM, Vicodin, prednisone  Dx imaging: 06/27/20 C-spine CT & R shoulder XR  05/10/20 R shoulder XR  Pertinent review of Systems: No fevers or chills  Relevant historical information: Hypertension.  Uncontrolled diabetes.  History of GI bleed with NSAIDs.   Objective:    Vitals:   10/31/20 1249  BP: (!) 178/122  Pulse: 68  SpO2: 98%   General: Well Developed, well nourished, and in no acute distress.   MSK: C-spine normal-appearing Nontender midline. Tender palpation right trapezius and rhomboid. Decreased cervical motion with reproduced pain in the right trapezius with right lateral flexion and rotation. Upper extremity strength is intact. Negative Spurling's test. Reflexes are intact.  Right shoulder normal-appearing Nontender. Range of motion limited abduction to 110 degrees.  Internal rotation lumbar spine external rotation full. Strength is intact. Positive Hawkins and Neer's test. Negative empty can test. Negative Yergason's and speeds test.  Lab and Radiology Results  Diagnostic  Limited MSK Ultrasound of: Right shoulder Biceps tendon intact normal-appearing Subscapularis tendon is intact. Supraspinatus tendon is intact. Mild subacromial bursitis is present. Infraspinatus tendon is intact. AC joint normal-appearing Impression: Subacromial bursitis   CT cervical spine:   1. No evidence of acute fracture to the cervical spine. 2. Leftward rotation of C1 upon C2, which may be related to patient head positioning at the time of image acquisition. Clinical correlation is recommended. 3. Mild C3-C4 and C4-C5 grade 1 anterolisthesis. 4. Mild cervical levocurvature. 5. Cervical spondylosis, as described.     Electronically Signed   By: Jackey Loge DO   On: 06/27/2020 09:19   EXAM: RIGHT SHOULDER - 2+ VIEW   COMPARISON:  Right shoulder series 05/10/2020.   FINDINGS: No glenohumeral joint dislocation. Bone mineralization is within normal limits. Intact proximal right humerus. Degenerative spurring at the inferior glenoid and right AC joint again noted. Visible right clavicle and scapula appear intact. No acute osseous abnormality identified. Negative visible right ribs and chest.   IMPRESSION: No acute fracture or dislocation identified about the right shoulder.     Electronically Signed   By: Odessa Fleming M.D.   On: 06/27/2020 08:55    I, Jose Morrow, personally (independently) visualized and performed the interpretation of the images attached in this note.   Lab Results  Component Value Date   HGBA1C 11.3 (H) 10/29/2020      Impression and Recommendations:    Assessment and Plan: 55 y.o. male with chronic right shoulder pain.  Patient suffered an injury to the right shoulder in March  of this year and became quite ill in April with a GI bleed and acute kidney injury after a bunch of NSAIDs and his access to care was somewhat delayed until recently.  Currently he has a significant amount of trapezius and periscapular dysfunction as well as what  appears to be some rotator cuff tendinopathy or adhesive capsulitis type pain.  His detailed shoulder exam is somewhat limited by guarding from periscapular motion.  He is an excellent candidate for physical therapy which should be the primary means of improvement.  Plan for referral to PT.  He notes difficulty sleeping because of pain.  Will use tizanidine at bedtime.  Also recommend heating pad TENS unit and percussive massager.  Recheck in 4 to 6 weeks.  His A1c diabetes will need to be under better control prior to proceeding with steroid injection if needed..  Fortunately he starting Lantus today.  PDMP not reviewed this encounter. Orders Placed This Encounter  Procedures   [place] LIMITED JOINT SPACE STRUCTURES UP RIGHT(NO LINKED CHARGES)    Standing Status:   Future    Number of Occurrences:   1    Standing Expiration Date:   04/30/2021    Order Specific Question:   Reason for Exam (SYMPTOM  OR DIAGNOSIS REQUIRED)    Answer:   right shoulder pain    Order Specific Question:   Preferred imaging location?    Answer:   [facility] referral to Physical Therapy    Referral Priority:   Routine    Referral Type:   Physical Medicine    Referral Reason:   Specialty Services Required    Requested Specialty:   Physical Therapy   Meds ordered this encounter  Medications   tiZANidine (ZANAFLEX) 4 MG tablet    Sig: Take 1 tablet (4 mg total) by mouth every 6 (six) hours as needed for muscle spasms.    Dispense:  30 tablet    Refill:  1    Discussed warning signs or symptoms. Please see discharge instructions. Patient expresses understanding.   The above documentation has been reviewed and is accurate and complete Jose Morrow, M.D.

## 2020-10-31 ENCOUNTER — Ambulatory Visit (INDEPENDENT_AMBULATORY_CARE_PROVIDER_SITE_OTHER): Payer: 59 | Admitting: Family Medicine

## 2020-10-31 ENCOUNTER — Ambulatory Visit: Payer: Self-pay

## 2020-10-31 ENCOUNTER — Other Ambulatory Visit: Payer: Self-pay

## 2020-10-31 VITALS — BP 178/122 | HR 68 | Ht 69.0 in | Wt 182.8 lb

## 2020-10-31 DIAGNOSIS — G8929 Other chronic pain: Secondary | ICD-10-CM | POA: Diagnosis not present

## 2020-10-31 DIAGNOSIS — M62838 Other muscle spasm: Secondary | ICD-10-CM | POA: Diagnosis not present

## 2020-10-31 DIAGNOSIS — M25511 Pain in right shoulder: Secondary | ICD-10-CM

## 2020-10-31 MED ORDER — TIZANIDINE HCL 4 MG PO TABS: 4.0000 mg | ORAL_TABLET | Freq: Four times a day (QID) | ORAL | 1 refills | Status: DC | PRN

## 2020-10-31 NOTE — Patient Instructions (Addendum)
Thank you for coming in today.   I've referred you to Physical Therapy.  Let us know if you don't hear from them in one week.   Heating pad can help.   TENS unit can help.   Massage gun can help.   The muscle relaxer tizanidine can take 3x daily but it can make you sleepy so mostly take it bedtime.   Recheck with me in about 1 month or 6 weeks.    TENS UNIT: This is helpful for muscle pain and spasm.   Search and Purchase a TENS 7000 2nd edition at  www.tenspros.com or www.Amazon.com It should be less than $30.     TENS unit instructions: Do not shower or bathe with the unit on Turn the unit off before removing electrodes or batteries If the electrodes lose stickiness add a drop of water to the electrodes after they are disconnected from the unit and place on plastic sheet. If you continued to have difficulty, call the TENS unit company to purchase more electrodes. Do not apply lotion on the skin area prior to use. Make sure the skin is clean and dry as this will help prolong the life of the electrodes. After use, always check skin for unusual red areas, rash or other skin difficulties. If there are any skin problems, does not apply electrodes to the same area. Never remove the electrodes from the unit by pulling the wires. Do not use the TENS unit or electrodes other than as directed. Do not change electrode placement without consultating your therapist or physician. Keep 2 fingers with between each electrode. Wear time ratio is 2:1, on to off times.    For example on for 30 minutes off for 15 minutes and then on for 30 minutes off for 15 minutes

## 2020-11-02 ENCOUNTER — Encounter: Payer: Self-pay | Admitting: Internal Medicine

## 2020-11-02 LAB — ALDOSTERONE + RENIN ACTIVITY W/ RATIO
ALDO / PRA Ratio: 18.2 Ratio (ref 0.9–28.9)
Aldosterone: 4 ng/dL
Renin Activity: 0.22 ng/mL/h — ABNORMAL LOW (ref 0.25–5.82)

## 2020-11-02 LAB — VITAMIN B1: Vitamin B1 (Thiamine): 14 nmol/L (ref 8–30)

## 2020-11-09 NOTE — Telephone Encounter (Signed)
   Patient calling to report medications are too costly. He states the Levemir is $500 after insurance. He also states ALL the  medications that were called to pharmacy come to a grand total of $2700+ after insurance. The only reasonably  priced medication was the amlodipine. He was able to use a GoodRX card for that.  Patient seeking alternative

## 2020-11-12 ENCOUNTER — Other Ambulatory Visit: Payer: Self-pay | Admitting: Internal Medicine

## 2020-11-12 DIAGNOSIS — Z794 Long term (current) use of insulin: Secondary | ICD-10-CM

## 2020-11-12 DIAGNOSIS — E119 Type 2 diabetes mellitus without complications: Secondary | ICD-10-CM

## 2020-11-12 DIAGNOSIS — E113213 Type 2 diabetes mellitus with mild nonproliferative diabetic retinopathy with macular edema, bilateral: Secondary | ICD-10-CM

## 2020-11-12 DIAGNOSIS — E1165 Type 2 diabetes mellitus with hyperglycemia: Secondary | ICD-10-CM

## 2020-11-12 DIAGNOSIS — D500 Iron deficiency anemia secondary to blood loss (chronic): Secondary | ICD-10-CM | POA: Insufficient documentation

## 2020-11-12 MED ORDER — NOVOLOG FLEXPEN 100 UNIT/ML ~~LOC~~ SOPN: 15.0000 [IU] | PEN_INJECTOR | Freq: Three times a day (TID) | SUBCUTANEOUS | 1 refills | Status: DC

## 2020-11-12 MED ORDER — IRON (FERROUS SULFATE) 325 (65 FE) MG PO TABS: 325.0000 mg | ORAL_TABLET | Freq: Two times a day (BID) | ORAL | 1 refills | Status: AC

## 2020-11-12 NOTE — Telephone Encounter (Signed)
Spoke with patient and pharmacy   Per [facility]:  Patient picked up levemir - 90 day supply for $120, tizanidine, metformin, atorvastatin, and amlodipine - 90 day supplied  Patient did not pick up triamterene/hctz - unsure as to why, advised patient to pick up prescription and continue  Humalog and accrufer not covered by insurance - cash priced for each medication was ~$1500 - likely the reason for the high total for his copay, patient did not pick up either medication  Recommending for humalog to be replaced by novolog as it is preferred by insurance, could then recommend iron supplement with ferrous sulfate 65mg  tablet as accrufer is non-formulary and the ferrous sulfate formulations are the only covered products on the insurance formulary  Patient to reach out should copays remain unaffordable to discuss alternative options  , PharmD Clinical Pharmacist, Ellin Saba

## 2020-11-28 ENCOUNTER — Ambulatory Visit: Payer: 59 | Admitting: Family Medicine

## 2020-11-28 NOTE — Progress Notes (Deleted)
   I, Philbert Riser, LAT, ATC acting as a scribe for Clementeen Graham, MD.  Jose Morrow is a 55 y.o. male who presents to [facility] today for f/u chronic R shoulder pain ongoing since the end of Feb after he "jarred" his R shoulder/arm when taking up some flooring. Pt drives a fork lift for work. Pt was last seen by Dr. Denyse Amass on 10/31/20 and was prescribed tizanidine, and was advised to use a massage gun, TENS unit, heating pad, and was referred to PT, but he has not yet completed/scheduled any visits. Today, pt reports  Dx imaging: 06/27/20 C-spine CT & R shoulder XR             05/10/20 R shoulder XR  Pertinent review of systems: ***  Relevant historical information: ***   Exam:  There were no vitals taken for this visit. General: Well Developed, well nourished, and in no acute distress.   MSK: ***    Lab and Radiology Results No results found for this or any previous visit (from the past 72 hour(s)). No results found.     Assessment and Plan: 55 y.o. male with ***   PDMP not reviewed this encounter. No orders of the defined types were placed in this encounter.  No orders of the defined types were placed in this encounter.    Discussed warning signs or symptoms. Please see discharge instructions. Patient expresses understanding.   ***

## 2021-06-11 ENCOUNTER — Ambulatory Visit: Payer: 59 | Admitting: Internal Medicine

## 2021-06-11 NOTE — Progress Notes (Deleted)
Name: Jose Morrow  MRN/ DOB: [id number], Dec 12, 1965   Age/ Sex: 56 y.o., male    PCP: Etta Grandchild, MD   Reason for Endocrinology Evaluation: Type 2 Diabetes Mellitus     Date of Initial Endocrinology Visit: 06/11/2021     PATIENT IDENTIFIER: Mr. Jose Morrow is a 56 y.o. male with a past medical history of HTN, T2DM, and dyslipidemia. The patient presented for initial endocrinology clinic visit on 06/11/2021 for consultative assistance with his diabetes management.    HPI: Mr. Kimpson was    Diagnosed with DM *** Prior Medications tried/Intolerance: *** Currently checking blood sugars *** x / day,  before breakfast and ***.  Hypoglycemia episodes : ***               Symptoms: ***                 Frequency: ***/  Hemoglobin A1c has ranged from 6.5% in 2019, peaking at 11.3% in 2022. Patient required assistance for hypoglycemia:  Patient has required hospitalization within the last 1 year from hyper or hypoglycemia:   In terms of diet, the patient ***   HOME DIABETES REGIMEN: Metformin 750 XR Levemir NovoLog   Statin: Yes ACE-I/ARB: No Prior Diabetic Education: {Yes/No:11203}   METER DOWNLOAD SUMMARY: Date range evaluated: *** Fingerstick Blood Glucose Tests = *** Average Number Tests/Day = *** Overall Mean FS Glucose = *** Standard Deviation = ***  BG Ranges: Low = *** High = ***   Hypoglycemic Events/30 Days: BG < 50 = *** Episodes of symptomatic severe hypoglycemia = ***   DIABETIC COMPLICATIONS: Microvascular complications:  *** Denies: CKD, Last eye exam: Completed   Macrovascular complications:   Denies: CAD, PVD, CVA   PAST HISTORY: Past Medical History:  Past Medical History:  Diagnosis Date   Diabetes mellitus    Hypertension    Past Surgical History:  Past Surgical History:  Procedure Laterality Date   BIOPSY  06/28/2020   Procedure: BIOPSY;  Surgeon: Hilarie Fredrickson, MD;  Location: [facility] ENDOSCOPY;  Service: Endoscopy;;    ESOPHAGOGASTRODUODENOSCOPY (EGD) WITH PROPOFOL N/A 06/28/2020   Procedure: ESOPHAGOGASTRODUODENOSCOPY (EGD) WITH PROPOFOL;  Surgeon: Hilarie Fredrickson, MD;  Location: [facility] ENDOSCOPY;  Service: Endoscopy;  Laterality: N/A;    Social History:  reports that he has been smoking cigarettes. He has a 60.00 pack-year smoking history. He has never used smokeless tobacco. He reports that he does not drink alcohol and does not use drugs. Family History: No family history on file.   HOME MEDICATIONS: Allergies as of 06/11/2021       Reactions   Penicillins Anaphylaxis        Medication List        Accurate as of June 11, 2021  7:34 AM. If you have any questions, ask your nurse or doctor.          Accu-Chek Guide test strip Generic drug: glucose blood Use as instructed   Accu-Chek Guide w/Device Kit Use as directed   Accu-Chek Softclix Lancets lancets Use as directed.   amLODipine 10 MG tablet Commonly known as: NORVASC Take 1 tablet (10 mg total) by mouth daily.   atorvastatin 20 MG tablet Commonly known as: LIPITOR Take 1 tablet (20 mg total) by mouth daily.   FreeStyle Libre 2 Reader [place] 1 Act by Does not apply route daily.   FreeStyle Libre 2 Sensor Misc 1 Act by Does not apply route daily.   Gvoke HypoPen 2-Pack 1  MG/0.2ML Soaj Generic drug: Glucagon Inject 1 Act into the skin daily as needed.   insulin detemir 100 UNIT/ML FlexPen Commonly known as: LEVEMIR Inject 50 Units into the skin daily.   Insulin Pen Needle 32G X 4 MM Misc 1 each by Does not apply route 4 (four) times daily -  with meals and at bedtime.   Iron (Ferrous Sulfate) 325 (65 Fe) MG Tabs Take 325 mg by mouth 2 (two) times daily.   metFORMIN 750 MG 24 hr tablet Commonly known as: Glucophage XR Take 1 tablet (750 mg total) by mouth daily with breakfast.   NovoLOG FlexPen 100 UNIT/ML FlexPen Generic drug: insulin aspart Inject 15 Units into the skin 3 (three) times daily with meals.    pantoprazole 40 MG tablet Commonly known as: PROTONIX Take 1 tablet (40 mg total) by mouth 2 (two) times daily before a meal.   tiZANidine 4 MG tablet Commonly known as: ZANAFLEX Take 1 tablet (4 mg total) by mouth every 6 (six) hours as needed for muscle spasms.   triamterene-hydrochlorothiazide 37.5-25 MG capsule Commonly known as: Dyazide Take 1 each (1 capsule total) by mouth daily.         ALLERGIES: Allergies  Allergen Reactions   Penicillins Anaphylaxis     REVIEW OF SYSTEMS: A comprehensive ROS was conducted with the patient and is negative except as per HPI and below:  ROS    OBJECTIVE:   VITAL SIGNS: There were no vitals taken for this visit.   PHYSICAL EXAM:  General: Pt appears well and is in NAD  Neck: General: Supple without adenopathy or carotid bruits. Thyroid: Thyroid size normal.  No goiter or nodules appreciated.   Lungs: Clear with good BS bilat with no rales, rhonchi, or wheezes  Heart: RRR   Abdomen: Normoactive bowel sounds, soft, nontender, without masses or organomegaly palpable  Extremities:  Lower extremities - No pretibial edema. No lesions.  Neuro: MS is good with appropriate affect, pt is alert and Ox3    DM foot exam:    DATA REVIEWED:  Lab Results  Component Value Date   HGBA1C 11.3 (H) 10/29/2020   HGBA1C 11.1 (H) 06/27/2020   HGBA1C 6.5 (H) 12/14/2017   Lab Results  Component Value Date   MICROALBUR 5.7 (H) 10/29/2020   LDLCALC  11/09/2009    97        Total Cholesterol/HDL:CHD Risk Coronary Heart Disease Risk Table                     Men   Women  1/2 Average Risk   3.4   3.3  Average Risk       5.0   4.4  2 X Average Risk   9.6   7.1  3 X Average Risk  23.4   11.0        Use the calculated Patient Ratio above and the CHD Risk Table to determine the patient's CHD Risk.        ATP III CLASSIFICATION (LDL):  <100     mg/dL   Optimal  347-425  mg/dL   Near or Above                    Optimal  130-159  mg/dL    Borderline  956-387  mg/dL   High  >564     mg/dL   Very High   CREATININE 1.47 10/29/2020   Lab Results  Component Value Date  MICRALBCREAT 2.1 10/29/2020    Lab Results  Component Value Date   CHOL 149 10/29/2020   HDL 45.80 10/29/2020   LDLCALC  11/09/2009    97        Total Cholesterol/HDL:CHD Risk Coronary Heart Disease Risk Table                     Men   Women  1/2 Average Risk   3.4   3.3  Average Risk       5.0   4.4  2 X Average Risk   9.6   7.1  3 X Average Risk  23.4   11.0        Use the calculated Patient Ratio above and the CHD Risk Table to determine the patient's CHD Risk.        ATP III CLASSIFICATION (LDL):  <100     mg/dL   Optimal  161-096  mg/dL   Near or Above                    Optimal  130-159  mg/dL   Borderline  045-409  mg/dL   High  >811     mg/dL   Very High   LDLDIRECT 77.0 10/29/2020   TRIG 303.0 (H) 10/29/2020   CHOLHDL 3 10/29/2020        ASSESSMENT / PLAN / RECOMMENDATIONS:   1) Type *** Diabetes Mellitus, ***controlled, With*** complications - Most recent A1c of *** %. Goal A1c <7.0%.    Plan: GENERAL: ***  MEDICATIONS: ***  EDUCATION / INSTRUCTIONS: BG monitoring instructions: Patient is instructed to check his blood sugars *** times a day, ***. Call [facility] if: BG persistently < 70  I reviewed the Rule of 15 for the treatment of hypoglycemia in detail with the patient. Literature supplied.   2) Diabetic complications:  Eye: Does *** have known diabetic retinopathy.  Neuro/ Feet: Does *** have known diabetic peripheral neuropathy. Renal: Patient does *** have known baseline CKD. He is *** on an ACEI/ARB at present.     3) Dyslipidemia:  -LDL at goal but his TG is elevated at 303 mg/DL   Medication Continue atorvastatin 20 mg daily   Signed electronically by: Lyndle Herrlich, MD  [facility]  [facility] [address] [place] [address] [place], [place]  [address] Phone: [phone] FAX: [phone]   CC: Etta Grandchild, MD [address] [place] [address] Phone: [phone]  Fax: [phone]    Return to Endocrinology clinic as below: Future Appointments  Date Time Provider Department Center  06/11/2021 11:30 AM Harlowe Dowler, Konrad Dolores, MD [id number] None
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
## 2021-11-10 ENCOUNTER — Ambulatory Visit (HOSPITAL_COMMUNITY)
Admission: EM | Admit: 2021-11-10 | Discharge: 2021-11-10 | Disposition: A | Discharge status: Home / Self Care | Payer: 59

## 2021-11-10 ENCOUNTER — Emergency Department (HOSPITAL_COMMUNITY)
Admission: EM | Admit: 2021-11-10 | Discharge: 2021-11-10 | Discharge status: Against Medical Advice | Payer: Commercial Managed Care - HMO | Attending: Emergency Medicine | Admitting: Emergency Medicine

## 2021-11-10 ENCOUNTER — Encounter (HOSPITAL_COMMUNITY): Payer: Self-pay

## 2021-11-10 ENCOUNTER — Other Ambulatory Visit: Payer: Self-pay

## 2021-11-10 ENCOUNTER — Emergency Department (HOSPITAL_COMMUNITY): Payer: Commercial Managed Care - HMO

## 2021-11-10 ENCOUNTER — Encounter (HOSPITAL_COMMUNITY): Payer: Self-pay | Admitting: *Deleted

## 2021-11-10 DIAGNOSIS — Y92410 Unspecified street and highway as the place of occurrence of the external cause: Secondary | ICD-10-CM | POA: Insufficient documentation

## 2021-11-10 DIAGNOSIS — S0993XA Unspecified injury of face, initial encounter: Secondary | ICD-10-CM | POA: Diagnosis present

## 2021-11-10 DIAGNOSIS — S0083XA Contusion of other part of head, initial encounter: Secondary | ICD-10-CM | POA: Diagnosis not present

## 2021-11-10 DIAGNOSIS — E119 Type 2 diabetes mellitus without complications: Secondary | ICD-10-CM | POA: Diagnosis not present

## 2021-11-10 DIAGNOSIS — I10 Essential (primary) hypertension: Secondary | ICD-10-CM | POA: Insufficient documentation

## 2021-11-10 DIAGNOSIS — E1165 Type 2 diabetes mellitus with hyperglycemia: Secondary | ICD-10-CM

## 2021-11-10 DIAGNOSIS — E113213 Type 2 diabetes mellitus with mild nonproliferative diabetic retinopathy with macular edema, bilateral: Secondary | ICD-10-CM

## 2021-11-10 DIAGNOSIS — S0990XA Unspecified injury of head, initial encounter: Secondary | ICD-10-CM

## 2021-11-10 DIAGNOSIS — E785 Hyperlipidemia, unspecified: Secondary | ICD-10-CM

## 2021-11-10 LAB — BASIC METABOLIC PANEL
Anion gap: 10 (ref 5–15)
BUN: 10 mg/dL (ref 6–20)
CO2: 30 mmol/L (ref 22–32)
Calcium: 9.3 mg/dL (ref 8.9–10.3)
Chloride: 102 mmol/L (ref 98–111)
Creatinine, Ser: 1.24 mg/dL (ref 0.61–1.24)
GFR, Estimated: >60 mL/min (ref >60)
Glucose, Bld: 186 mg/dL — ABNORMAL HIGH (ref 70–99)
Potassium: 3.5 mmol/L (ref 3.5–5.1)
Sodium: 142 mmol/L (ref 135–145)

## 2021-11-10 LAB — CBC WITH DIFFERENTIAL/PLATELET
Abs Immature Granulocytes: 0.01 10*3/uL (ref 0.00–0.07)
Basophils Absolute: 0.1 10*3/uL (ref 0.0–0.1)
Basophils Relative: 1 %
Eosinophils Absolute: 0.1 10*3/uL (ref 0.0–0.5)
Eosinophils Relative: 2 %
HCT: 54.7 % — ABNORMAL HIGH (ref 39.0–52.0)
Hemoglobin: 19.3 g/dL — ABNORMAL HIGH (ref 13.0–17.0)
Immature Granulocytes: 0 %
Lymphocytes Relative: 25 %
Lymphs Abs: 1.6 10*3/uL (ref 0.7–4.0)
MCH: 30.5 pg (ref 26.0–34.0)
MCHC: 35.3 g/dL (ref 30.0–36.0)
MCV: 86.4 fL (ref 80.0–100.0)
Monocytes Absolute: 0.6 10*3/uL (ref 0.1–1.0)
Monocytes Relative: 9 %
Neutro Abs: 4.1 10*3/uL (ref 1.7–7.7)
Neutrophils Relative %: 63 %
Platelets: 218 10*3/uL (ref 150–400)
RBC: 6.33 MIL/uL — ABNORMAL HIGH (ref 4.22–5.81)
RDW: 13.2 % (ref 11.5–15.5)
WBC: 6.4 10*3/uL (ref 4.0–10.5)
nRBC: 0 % (ref 0.0–0.2)

## 2021-11-10 LAB — URINALYSIS, ROUTINE W REFLEX MICROSCOPIC
Bacteria, UA: NONE SEEN
Bilirubin Urine: NEGATIVE
Glucose, UA: 50 mg/dL — AB
Ketones, ur: NEGATIVE mg/dL
Leukocytes,Ua: NEGATIVE
Nitrite: NEGATIVE
Protein, ur: 100 mg/dL — AB
Specific Gravity, Urine: 1.017 (ref 1.005–1.030)
pH: 6 (ref 5.0–8.0)

## 2021-11-10 LAB — TROPONIN I (HIGH SENSITIVITY)
Troponin I (High Sensitivity): 39 ng/L — ABNORMAL HIGH (ref <18)
Troponin I (High Sensitivity): 42 ng/L — ABNORMAL HIGH (ref <18)

## 2021-11-10 MED ORDER — TRIAMTERENE-HCTZ 37.5-25 MG PO CAPS: 1.0000 | ORAL_CAPSULE | Freq: Every day | ORAL | 0 refills | Status: DC

## 2021-11-10 MED ORDER — ACCU-CHEK GUIDE W/DEVICE KIT: PACK | 0 refills | Status: AC

## 2021-11-10 MED ORDER — INSULIN DETEMIR 100 UNIT/ML FLEXPEN: 50.0000 [IU] | PEN_INJECTOR | Freq: Every day | SUBCUTANEOUS | 0 refills | Status: DC

## 2021-11-10 MED ORDER — AMLODIPINE BESYLATE 5 MG PO TABS
10.0000 mg | ORAL_TABLET | Freq: Once | ORAL | Status: AC
  Administered 2021-11-10: 10 mg via ORAL
  Filled 2021-11-10: qty 2

## 2021-11-10 MED ORDER — AMLODIPINE BESYLATE 10 MG PO TABS: 10.0000 mg | ORAL_TABLET | Freq: Every day | ORAL | 0 refills | Status: DC

## 2021-11-10 MED ORDER — CLINDAMYCIN HCL 300 MG PO CAPS: 300.0000 mg | ORAL_CAPSULE | Freq: Three times a day (TID) | ORAL | 0 refills | Status: AC

## 2021-11-10 MED ORDER — METFORMIN HCL ER 750 MG PO TB24: 750.0000 mg | ORAL_TABLET | Freq: Every day | ORAL | 0 refills | Status: DC

## 2021-11-10 MED ORDER — HYDRALAZINE HCL 25 MG PO TABS
25.0000 mg | ORAL_TABLET | Freq: Once | ORAL | Status: AC
  Administered 2021-11-10: 25 mg via ORAL
  Filled 2021-11-10: qty 1

## 2021-11-10 MED ORDER — ATORVASTATIN CALCIUM 20 MG PO TABS: 20.0000 mg | ORAL_TABLET | Freq: Every day | ORAL | 3 refills | Status: AC

## 2021-11-10 MED ORDER — INSULIN PEN NEEDLE 32G X 4 MM MISC: 1.0000 | Freq: Three times a day (TID) | 1 refills | Status: AC

## 2021-11-10 MED ORDER — PANTOPRAZOLE SODIUM 40 MG PO TBEC: 40.0000 mg | DELAYED_RELEASE_TABLET | Freq: Two times a day (BID) | ORAL | 1 refills | Status: DC

## 2021-11-10 MED ORDER — CLINDAMYCIN HCL 150 MG PO CAPS
300.0000 mg | ORAL_CAPSULE | Freq: Once | ORAL | Status: AC
  Administered 2021-11-10: 300 mg via ORAL
  Filled 2021-11-10: qty 2

## 2021-11-10 MED ORDER — ACCU-CHEK GUIDE VI STRP: ORAL_STRIP | 12 refills | Status: AC
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
## 2021-11-10 NOTE — ED Notes (Signed)
Pt called 2x no response  

## 2021-11-10 NOTE — Discharge Instructions (Addendum)
D/C to ED via POV 
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
## 2021-11-10 NOTE — ED Notes (Signed)
Pt discussed understanding with MD that he is leaving AMA, voices to RN again that he understands the risks associated with high blood pressure. Ensured pt understands medications sent to pharmacy and how to use as well as the community resources provided in packet. He is alert, oriented x4, NAD, ambulatory without assistance or device.
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
## 2022-01-10 ENCOUNTER — Ambulatory Visit (HOSPITAL_COMMUNITY)
Admission: EM | Admit: 2022-01-10 | Discharge: 2022-01-10 | Disposition: A | Discharge status: Home / Self Care | Payer: Commercial Managed Care - HMO | Attending: Family Medicine | Admitting: Family Medicine

## 2022-01-10 ENCOUNTER — Encounter (HOSPITAL_COMMUNITY): Payer: Self-pay | Admitting: *Deleted

## 2022-01-10 DIAGNOSIS — M79605 Pain in left leg: Secondary | ICD-10-CM | POA: Diagnosis not present

## 2022-01-10 DIAGNOSIS — M79604 Pain in right leg: Secondary | ICD-10-CM | POA: Diagnosis not present

## 2022-01-10 DIAGNOSIS — M779 Enthesopathy, unspecified: Secondary | ICD-10-CM

## 2022-01-10 MED ORDER — TIZANIDINE HCL 4 MG PO TABS: 4.0000 mg | ORAL_TABLET | Freq: Four times a day (QID) | ORAL | 0 refills | Status: DC | PRN

## 2022-01-10 MED ORDER — PREDNISONE 20 MG PO TABS: 40.0000 mg | ORAL_TABLET | Freq: Every day | ORAL | 0 refills | Status: AC

## 2022-01-10 NOTE — ED Provider Notes (Signed)
[place]    CSN: [id number] Arrival date & time: 01/10/22  0801      History   Chief Complaint Chief Complaint  Patient presents with   Leg Pain    HPI Jose Morrow is a 56 y.o. male.   Patient is here for bilateral leg pain with walking.  The elevator was broken at work, and had to use the stairs about a week ago.  Had to go up/down 4 flights of stairs.  He noted some pain/burning in his lower legs.  Now the LE feel tight, and the left LE appears swollen.  Yesterday he had so much pain he almost fell.  Pain mostly in the calves bilaterally.  He has not taken anything for pain.  He has h/o ulcer and unable to take nsaids.        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, with long-term current use of insulin ([place]) 10/29/2020   Continuous tobacco abuse 10/29/2020   Duodenal ulcer    Melena    Symptomatic anemia 06/26/2020   Uncontrolled type 2 diabetes mellitus with hyperglycemia ([place]) 06/26/2020   Hypertension     Past Surgical History:  Procedure Laterality Date   BIOPSY  06/28/2020   Procedure: BIOPSY;  Surgeon: Irene Shipper, MD;  Location: [place];  Service: Endoscopy;;   ESOPHAGOGASTRODUODENOSCOPY (EGD) WITH PROPOFOL N/A 06/28/2020   Procedure: ESOPHAGOGASTRODUODENOSCOPY (EGD) WITH PROPOFOL;  Surgeon: Irene Shipper, MD;  Location: [place];  Service: Endoscopy;  Laterality: N/A;       Home Medications    Prior to Admission medications   Medication Sig Start Date End Date Taking? Authorizing Provider   Accu-Chek Softclix Lancets lancets Use as directed. 06/29/20  Yes Antonieta Pert, MD  amLODipine (NORVASC) 10 MG tablet Take 1 tablet (10 mg total) by mouth daily. 11/10/21 02/08/22 Yes Curatolo, Adam, DO  atorvastatin (LIPITOR) 20 MG tablet Take 1 tablet (20 mg total) by mouth daily. 11/10/21  Yes Curatolo, Adam, DO  Blood Glucose Monitoring Suppl (ACCU-CHEK GUIDE) w/Device KIT Use as directed 11/10/21  Yes Curatolo, Adam, DO  Continuous Blood Gluc Receiver (FREESTYLE LIBRE 2 READER) DEVI 1 Act by Does not apply route daily. 10/29/20  Yes Janith Lima, MD  Continuous Blood Gluc Sensor (FREESTYLE LIBRE 2 SENSOR) MISC 1 Act by Does not apply route daily. 10/29/20  Yes Janith Lima, MD  Glucagon (GVOKE HYPOPEN 2-PACK) 1 MG/0.2ML SOAJ Inject 1 Act into the skin daily as needed. 10/29/20  Yes Janith Lima, MD  glucose blood (ACCU-CHEK GUIDE) test strip Use as instructed 11/10/21  Yes Curatolo, Adam, DO  insulin aspart (NOVOLOG FLEXPEN) 100 UNIT/ML FlexPen Inject 15 Units into the skin 3 (three) times daily with meals. 11/12/20  Yes Janith Lima, MD  insulin detemir (LEVEMIR) 100 UNIT/ML FlexPen Inject 50 Units into the skin daily. 11/10/21 02/09/22 Yes Curatolo, Adam, DO  Insulin Pen Needle 32G X 4 MM MISC 1 each by Does not apply route 4 (four) times daily -  with meals and at  bedtime. 11/10/21  Yes Curatolo, Adam, DO  Iron, Ferrous Sulfate, 325 (65 Fe) MG TABS Take 325 mg by mouth 2 (two) times daily. 11/12/20  Yes Janith Lima, MD  metFORMIN (GLUCOPHAGE XR) 750 MG 24 hr tablet Take 1 tablet (750 mg total) by mouth daily with breakfast. 11/10/21  Yes Curatolo, Adam, DO  triamterene-hydrochlorothiazide (DYAZIDE) 37.5-25 MG capsule Take 1 each (1 capsule total) by mouth daily. 11/10/21  Yes Curatolo, Adam, DO  pantoprazole (PROTONIX) 40 MG tablet Take 1 tablet (40 mg total) by mouth 2 (two) times daily before a meal. 11/10/21 01/09/22  Curatolo, Adam, DO  tiZANidine (ZANAFLEX) 4 MG tablet Take 1 tablet (4 mg  total) by mouth every 6 (six) hours as needed for muscle spasms. 10/31/20   Gregor Hams, MD  hydrochlorothiazide (HYDRODIURIL) 25 MG tablet Take 1 tablet (25 mg total) by mouth daily. 11/23/19 05/01/20  Garald Balding, PA-C    Family History History reviewed. No pertinent family history.  Social History Social History   Tobacco Use   Smoking status: Every Day    Packs/day: 1.50    Years: 40.00    Total pack years: 60.00    Types: Cigarettes   Smokeless tobacco: Never  Vaping Use   Vaping Use: Never used  Substance Use Topics   Alcohol use: Yes    Comment: occasionally   Drug use: No     Allergies   Penicillins and Ibuprofen   Review of Systems Review of Systems  Constitutional: Negative.   HENT: Negative.    Respiratory: Negative.    Cardiovascular: Negative.   Gastrointestinal: Negative.   Musculoskeletal:  Positive for gait problem and myalgias.  Psychiatric/Behavioral: Negative.       Physical Exam Triage Vital Signs ED Triage Vitals  Enc Vitals Group     BP 01/10/22 0823 (!) 219/107     Pulse Rate 01/10/22 0823 76     Resp 01/10/22 0823 18     Temp 01/10/22 0823 98 F (36.7 C)     Temp Source 01/10/22 0823 Oral     SpO2 01/10/22 0823 96 %     Weight --      Height --      Head Circumference --      Peak Flow --      Pain Score 01/10/22 0820 8     Pain Loc --      Pain Edu? --      Excl. in [place]? --    No data found.  Updated Vital Signs BP (!) 219/107 (BP Location: Right Arm)   Pulse 76   Temp 98 F (36.7 C) (Oral)   Resp 18   SpO2 96%   Visual Acuity Right Eye Distance:   Left Eye Distance:   Bilateral Distance:    Right Eye Near:   Left Eye Near:    Bilateral Near:     Physical Exam Constitutional:      Appearance: Normal appearance.  Cardiovascular:     Rate and Rhythm: Normal rate.  Pulmonary:     Effort: Pulmonary effort is normal.  Musculoskeletal:     Comments: TTP to the calf muscles bilaterally;  no swelling, cord like  lesions or erythema noted.  Slight swelling to the left lower leg;  full rom of the ankles, but c/o pain with movement.   Skin:    General: Skin is warm.  Neurological:     General: No focal deficit present.  Mental Status: He is alert.  Psychiatric:        Mood and Affect: Mood normal.      UC Treatments / Results  Labs (all labs ordered are listed, but only abnormal results are displayed) Labs Reviewed - No data to display  EKG   Radiology No results found.  Procedures Procedures (including critical care time)  Medications Ordered in UC Medications - No data to display  Initial Impression / Assessment and Plan / UC Course  I have reviewed the triage vital signs and the nursing notes.  Pertinent labs & imaging results that were available during my care of the patient were reviewed by me and considered in my medical decision making (see chart for details).   Final Clinical Impressions(s) / UC Diagnoses   Final diagnoses:  Bilateral leg pain  Tendonitis     Discharge Instructions      You were seen today for bilateral leg pain due to overuse.  I have sent out several medications to help you today.  I have sent out a muscle relaxer to help with the calf pain, as well as a steroid.  Please take the steroid with food to avoid upset stomach, and make sure you take protonix daily as well.  The muscle relaxer may make you sleepy so please take when home and not driving.  Please rest, keeping your legs elevated if possible.     ED Prescriptions     Medication Sig Dispense Auth. Provider   tiZANidine (ZANAFLEX) 4 MG tablet Take 1 tablet (4 mg total) by mouth every 6 (six) hours as needed for muscle spasms. 30 tablet Elesa Garman, MD   predniSONE (DELTASONE) 20 MG tablet Take 2 tablets (40 mg total) by mouth daily for 3 days. 6 tablet Rondel Oh, MD      PDMP not reviewed this encounter.   Rondel Oh, MD 01/10/22 [phone]

## 2022-01-10 NOTE — ED Triage Notes (Signed)
He has had bilateral leg pain x 1 week. He states the elevator was out at work and he had to take the stairs for three days. Now his legs are swelling. He isnt taking anything for the leg pain. He states he can't take IBU last time it caused a stomach ulcer.

## 2022-01-10 NOTE — Discharge Instructions (Addendum)
You were seen today for bilateral leg pain due to overuse.  I have sent out several medications to help you today.  I have sent out a muscle relaxer to help with the calf pain, as well as a steroid.  Please take the steroid with food to avoid upset stomach, and make sure you take protonix daily as well.  The muscle relaxer may make you sleepy so please take when home and not driving.  Please rest, keeping your legs elevated if possible.

## 2022-01-29 ENCOUNTER — Emergency Department (HOSPITAL_COMMUNITY): Payer: Commercial Managed Care - HMO

## 2022-01-29 ENCOUNTER — Other Ambulatory Visit: Payer: Self-pay

## 2022-01-29 ENCOUNTER — Emergency Department (HOSPITAL_COMMUNITY)
Admission: EM | Admit: 2022-01-29 | Discharge: 2022-01-29 | Disposition: A | Discharge status: Home / Self Care | Payer: Commercial Managed Care - HMO | Attending: Emergency Medicine | Admitting: Emergency Medicine

## 2022-01-29 DIAGNOSIS — Z794 Long term (current) use of insulin: Secondary | ICD-10-CM | POA: Insufficient documentation

## 2022-01-29 DIAGNOSIS — E119 Type 2 diabetes mellitus without complications: Secondary | ICD-10-CM | POA: Insufficient documentation

## 2022-01-29 DIAGNOSIS — M722 Plantar fascial fibromatosis: Secondary | ICD-10-CM | POA: Diagnosis not present

## 2022-01-29 DIAGNOSIS — Z79899 Other long term (current) drug therapy: Secondary | ICD-10-CM | POA: Diagnosis not present

## 2022-01-29 DIAGNOSIS — Z7984 Long term (current) use of oral hypoglycemic drugs: Secondary | ICD-10-CM | POA: Insufficient documentation

## 2022-01-29 DIAGNOSIS — M79671 Pain in right foot: Secondary | ICD-10-CM | POA: Diagnosis present

## 2022-01-29 DIAGNOSIS — I10 Essential (primary) hypertension: Secondary | ICD-10-CM | POA: Diagnosis not present

## 2022-01-29 NOTE — ED Triage Notes (Signed)
Pt reports right foot pain x 1 month. Denies trauma to foot

## 2022-01-29 NOTE — Discharge Instructions (Addendum)
As we discussed, your workup in the ER today was reassuring for acute findings.  I do suspect that you have plantar fasciitis.  I have attached rehab exercises that you can do at home for management of this.  I also recommend that you rest, ice, compress, and elevate your foot for additional symptomatic relief.  You may also take Tylenol/ibuprofen as needed for pain.  I have additionally given you a referral to orthopedics with a number to call to schedule an appointment for continued evaluation and management of your symptoms.  Return if development of any new or worsening symptoms.

## 2022-01-29 NOTE — ED Provider Notes (Signed)
[facility] DEPT Provider Note   CSN: [id number] Arrival date & time: 01/29/22  1029     History  Chief Complaint  Patient presents with   Foot Pain    Jose Morrow is a 56 y.o. male.  Patient with history of diabetes and hypertension presents today with complaints of right foot pain. He states that same has been ongoing and unchanged for the past month. Denies any known injuries. States that same hurts mostly at the base of his foot when he first gets out of bed in the morning. Does state that his job has been more physically demanding of late. He has not seen anyone for this previously and has not tried anything for his symptoms. Denies fevers or chills.  The history is provided by the patient. No language interpreter was used.  Foot Pain       Home Medications Prior to Admission medications   Medication Sig Start Date End Date Taking? Authorizing Provider  Accu-Chek Softclix Lancets lancets Use as directed. 06/29/20   Antonieta Pert, MD  amLODipine (NORVASC) 10 MG tablet Take 1 tablet (10 mg total) by mouth daily. 11/10/21 02/08/22  Curatolo, Adam, DO  atorvastatin (LIPITOR) 20 MG tablet Take 1 tablet (20 mg total) by mouth daily. 11/10/21   Curatolo, Adam, DO  Blood Glucose Monitoring Suppl (ACCU-CHEK GUIDE) w/Device KIT Use as directed 11/10/21   Lennice Sites, DO  Continuous Blood Gluc Receiver (FREESTYLE LIBRE 2 READER) DEVI 1 Act by Does not apply route daily. 10/29/20   Janith Lima, MD  Continuous Blood Gluc Sensor (FREESTYLE LIBRE 2 SENSOR) MISC 1 Act by Does not apply route daily. 10/29/20   Janith Lima, MD  Glucagon (GVOKE HYPOPEN 2-PACK) 1 MG/0.2ML SOAJ Inject 1 Act into the skin daily as needed. 10/29/20   Janith Lima, MD  glucose blood (ACCU-CHEK GUIDE) test strip Use as instructed 11/10/21   Curatolo, Adam, DO  insulin aspart (NOVOLOG FLEXPEN) 100 UNIT/ML FlexPen Inject 15 Units into the skin 3 (three) times daily with meals. 11/12/20    Janith Lima, MD  insulin detemir (LEVEMIR) 100 UNIT/ML FlexPen Inject 50 Units into the skin daily. 11/10/21 02/09/22  Curatolo, Adam, DO  Insulin Pen Needle 32G X 4 MM MISC 1 each by Does not apply route 4 (four) times daily -  with meals and at bedtime. 11/10/21   Curatolo, Adam, DO  Iron, Ferrous Sulfate, 325 (65 Fe) MG TABS Take 325 mg by mouth 2 (two) times daily. 11/12/20   Janith Lima, MD  metFORMIN (GLUCOPHAGE XR) 750 MG 24 hr tablet Take 1 tablet (750 mg total) by mouth daily with breakfast. 11/10/21   Curatolo, Adam, DO  pantoprazole (PROTONIX) 40 MG tablet Take 1 tablet (40 mg total) by mouth 2 (two) times daily before a meal. 11/10/21 01/09/22  Curatolo, Adam, DO  tiZANidine (ZANAFLEX) 4 MG tablet Take 1 tablet (4 mg total) by mouth every 6 (six) hours as needed for muscle spasms. 01/10/22   Piontek, Junie Panning, MD  triamterene-hydrochlorothiazide (DYAZIDE) 37.5-25 MG capsule Take 1 each (1 capsule total) by mouth daily. 11/10/21   Curatolo, Adam, DO  hydrochlorothiazide (HYDRODIURIL) 25 MG tablet Take 1 tablet (25 mg total) by mouth daily. 11/23/19 05/01/20  Garald Balding, PA-C      Allergies    Penicillins and Ibuprofen    Review of Systems   Review of Systems  Musculoskeletal:  Positive for arthralgias.  All other systems reviewed and  are negative.   Physical Exam Updated Vital Signs BP (!) 194/107 (BP Location: Right Arm)   Pulse 78   Temp 98.4 F (36.9 C) (Oral)   Resp 18   Ht _0  (1.753 m)   Wt 75 kg   SpO2 95%   BMI 24.42 kg/m  Physical Exam Vitals and nursing note reviewed.  Constitutional:      General: He is not in acute distress.    Appearance: Normal appearance. He is normal weight. He is not ill-appearing, toxic-appearing or diaphoretic.  HENT:     Head: Normocephalic and atraumatic.  Cardiovascular:     Rate and Rhythm: Normal rate.  Pulmonary:     Effort: Pulmonary effort is normal. No respiratory distress.  Musculoskeletal:        General: Normal  range of motion.     Cervical back: Normal range of motion.  Skin:    General: Skin is warm and dry.     Comments: Tenderness noted to palpation along the plantar fascia of the right foot. No erythema, warmth, fluctuance, induration. Capillary refill less than 2 seconds. ROM intact. DP and PT pulses intact and 2+. No obvious deformity.  Neurological:     General: No focal deficit present.     Mental Status: He is alert.  Psychiatric:        Mood and Affect: Mood normal.        Behavior: Behavior normal.     ED Results / Procedures / Treatments   Labs (all labs ordered are listed, but only abnormal results are displayed) Labs Reviewed - No data to display  EKG None  Radiology DG Foot Complete Right  Result Date: 01/29/2022 CLINICAL DATA:  Diffuse right foot pain 1 month.  No injury. EXAM: RIGHT FOOT COMPLETE - 3 VIEW COMPARISON:  Right ankle 03/02/2020 FINDINGS: Negative for fracture.  No osteomyelitis or acute bony abnormality Mild hallux valgus deformity with degenerative change in the first MTP. Widening between the fourth and fifth metatarsal likely chronic. Otherwise no significant arthropathy. IMPRESSION: 1. No acute abnormality. 2. Mild hallux valgus deformity with degenerative change first MTP. Electronically Signed   By: Franchot Gallo M.D.   On: 01/29/2022 12:23    Procedures Procedures    Medications Ordered in ED Medications - No data to display  ED Course/ Medical Decision Making/ A&P                           Medical Decision Making Amount and/or Complexity of Data Reviewed Radiology: ordered.   Patient presents today with complaints of right foot pain x 1 month. He is afebrile, non-toxic appearing, and in no acute distress with reassuring vital signs. Physical exam reveals no erythema or warmth. No concern for gout or septic arthritis. No obvious deformity. Tenderness noted to palpation of the plantar fascia consistent with plantar fascitis. X-ray imaging  reveals no acute findings. I have personally reviewed and interpreted this imaging and agree with radiology interpretation. Patient given bracing for management and ortho referral for continued evaluation and management. Also attached information about stretching exercises. No further emergent concerns, patient is stable for discharge. Will recommend RICE and anti-inflammatories. Patient is understanding and amenable with plan, educated on red flag symptoms that would prompt immediate return. Patient discharged in stable condition.     Final Clinical Impression(s) / ED Diagnoses Final diagnoses:  Plantar fasciitis of right foot    Rx / DC Orders ED Discharge  Orders     None     An After Visit Summary was printed and given to the patient.     Bud Face, PA-C 01/29/22 [place] A, DO 01/29/22 1606

## 2022-01-30 ENCOUNTER — Telehealth: Payer: Self-pay

## 2022-01-30 NOTE — Telephone Encounter (Signed)
Transition Care Management Unsuccessful Follow-up Telephone Call  Date of discharge and from where:  01/29/2022 from [facility] ED  Dx: Plantar Fascial Fibromatosis of right foot  Attempts:  1st Attempt  Reason for unsuccessful TCM follow-up call:  Left voice message  Brand Males. Reymundo Poll, LPN Nurse Health Advisor [facility]  Site: [facility] Direct Dial: [phone] fax

## 2022-01-31 ENCOUNTER — Telehealth: Payer: Self-pay

## 2022-01-31 NOTE — Telephone Encounter (Signed)
Transition Care Management Follow-up Telephone Call Date of discharge and from where: 01/29/2022 How have you been since you were released from the hospital? Still having foot pain Any questions or concerns? No  Items Reviewed: Did the pt receive and understand the discharge instructions provided? Yes  Medications obtained and verified? Yes  Other? No  Any new allergies since your discharge? No  Dietary orders reviewed? No Do you have support at home? Yes   Home Care and Equipment/Supplies: Were home health services ordered? no If so, what is the name of the agency? No  Has the agency set up a time to come to the patient's home? no Were any new equipment or medical supplies ordered?  No What is the name of the medical supply agency? No Were you able to get the supplies/equipment? no Do you have any questions related to the use of the equipment or supplies? No  Functional Questionnaire: (I = Independent and D = Dependent) ADLs: I  Bathing/Dressing- I  Meal Prep- I  Eating- I  Maintaining continence- I  Transferring/Ambulation- I  Managing Meds- I  Follow up appointments reviewed:  PCP Hospital f/u appt confirmed? Yes  Scheduled to see Dr.Jones on 02/05/2022@ 11:00am. Specialist Hospital f/u appt confirmed? No   Are transportation arrangements needed? No  If their condition worsens, is the pt aware to call PCP or go to the Emergency Dept.? Yes Was the patient provided with contact information for the PCP's office or ED? Yes Was to pt encouraged to call back with questions or concerns? Yes

## 2022-02-01 ENCOUNTER — Emergency Department (HOSPITAL_COMMUNITY): Payer: Commercial Managed Care - HMO

## 2022-02-01 ENCOUNTER — Emergency Department (HOSPITAL_COMMUNITY)
Admission: EM | Admit: 2022-02-01 | Discharge: 2022-02-01 | Disposition: A | Discharge status: Home / Self Care | Payer: Commercial Managed Care - HMO | Attending: Emergency Medicine | Admitting: Emergency Medicine

## 2022-02-01 ENCOUNTER — Other Ambulatory Visit: Payer: Self-pay

## 2022-02-01 DIAGNOSIS — F10120 Alcohol abuse with intoxication, uncomplicated: Secondary | ICD-10-CM | POA: Diagnosis not present

## 2022-02-01 DIAGNOSIS — E119 Type 2 diabetes mellitus without complications: Secondary | ICD-10-CM | POA: Insufficient documentation

## 2022-02-01 DIAGNOSIS — Z7984 Long term (current) use of oral hypoglycemic drugs: Secondary | ICD-10-CM | POA: Diagnosis not present

## 2022-02-01 DIAGNOSIS — Z794 Long term (current) use of insulin: Secondary | ICD-10-CM | POA: Diagnosis not present

## 2022-02-01 DIAGNOSIS — F10920 Alcohol use, unspecified with intoxication, uncomplicated: Secondary | ICD-10-CM

## 2022-02-01 DIAGNOSIS — R4182 Altered mental status, unspecified: Secondary | ICD-10-CM | POA: Diagnosis present

## 2022-02-01 LAB — CBC WITH DIFFERENTIAL/PLATELET
Abs Immature Granulocytes: 0.01 10*3/uL (ref 0.00–0.07)
Basophils Absolute: 0 10*3/uL (ref 0.0–0.1)
Basophils Relative: 1 %
Eosinophils Absolute: 0 10*3/uL (ref 0.0–0.5)
Eosinophils Relative: 0 %
HCT: 55 % — ABNORMAL HIGH (ref 39.0–52.0)
Hemoglobin: 18 g/dL — ABNORMAL HIGH (ref 13.0–17.0)
Immature Granulocytes: 0 %
Lymphocytes Relative: 28 %
Lymphs Abs: 1.4 10*3/uL (ref 0.7–4.0)
MCH: 28 pg (ref 26.0–34.0)
MCHC: 32.7 g/dL (ref 30.0–36.0)
MCV: 85.4 fL (ref 80.0–100.0)
Monocytes Absolute: 0.4 10*3/uL (ref 0.1–1.0)
Monocytes Relative: 8 %
Neutro Abs: 3.3 10*3/uL (ref 1.7–7.7)
Neutrophils Relative %: 63 %
Platelets: 172 10*3/uL (ref 150–400)
RBC: 6.44 MIL/uL — ABNORMAL HIGH (ref 4.22–5.81)
RDW: 17.2 % — ABNORMAL HIGH (ref 11.5–15.5)
WBC: 5.2 10*3/uL (ref 4.0–10.5)
nRBC: 0 % (ref 0.0–0.2)

## 2022-02-01 LAB — COMPREHENSIVE METABOLIC PANEL
ALT: 12 U/L (ref 0–44)
AST: 24 U/L (ref 15–41)
Albumin: 3.1 g/dL — ABNORMAL LOW (ref 3.5–5.0)
Alkaline Phosphatase: 48 U/L (ref 38–126)
Anion gap: 12 (ref 5–15)
BUN: 10 mg/dL (ref 6–20)
CO2: 22 mmol/L (ref 22–32)
Calcium: 8.5 mg/dL — ABNORMAL LOW (ref 8.9–10.3)
Chloride: 103 mmol/L (ref 98–111)
Creatinine, Ser: 1.19 mg/dL (ref 0.61–1.24)
GFR, Estimated: >60 mL/min (ref >60)
Glucose, Bld: 176 mg/dL — ABNORMAL HIGH (ref 70–99)
Potassium: 3.2 mmol/L — ABNORMAL LOW (ref 3.5–5.1)
Sodium: 137 mmol/L (ref 135–145)
Total Bilirubin: 0.2 mg/dL — ABNORMAL LOW (ref 0.3–1.2)
Total Protein: 6 g/dL — ABNORMAL LOW (ref 6.5–8.1)

## 2022-02-01 LAB — TROPONIN I (HIGH SENSITIVITY)
Troponin I (High Sensitivity): 34 ng/L — ABNORMAL HIGH (ref <18)
Troponin I (High Sensitivity): 6 ng/L (ref <18)

## 2022-02-01 LAB — ETHANOL: Alcohol, Ethyl (B): 313 mg/dL (ref <10)
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
## 2022-02-01 NOTE — ED Notes (Signed)
Patient refused vitals. Discharge instructions given to family members. Patient discharged with family members.
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
## 2022-02-01 NOTE — Discharge Instructions (Addendum)
Your blood test today showed that your alcohol level was extremely high.  This can be very dangerous situation.  People can die from drinking too much alcohol.  It is also dangerous (and illegal) to drive after drinking alcohol.  I strongly recommend you consider seeking help if you are drinking regularly, or heavily, or if your family has concerns about your drinking habits.  The rest of your blood test, including the CT scan your brain, did not show any medical emergency, or any other cause for your confusion and somnolence today.  I recommend that your family takes to home today, that you drink plenty of water at home, and that you rest at home for the rest of the evening.

## 2022-02-01 NOTE — ED Notes (Signed)
Patient removed all monitors and IV, patient walking around the emergency department. Patient redirected back to room. Patient getting dressed. Sister contacted about patient's wish to leave. MD at bedside speaking with patient and family.

## 2022-02-05 ENCOUNTER — Other Ambulatory Visit: Payer: Self-pay

## 2022-02-05 ENCOUNTER — Ambulatory Visit (INDEPENDENT_AMBULATORY_CARE_PROVIDER_SITE_OTHER): Payer: Commercial Managed Care - HMO | Admitting: Internal Medicine

## 2022-02-05 ENCOUNTER — Encounter: Payer: Self-pay | Admitting: Internal Medicine

## 2022-02-05 ENCOUNTER — Ambulatory Visit (INDEPENDENT_AMBULATORY_CARE_PROVIDER_SITE_OTHER): Payer: Commercial Managed Care - HMO

## 2022-02-05 ENCOUNTER — Telehealth: Payer: Self-pay

## 2022-02-05 ENCOUNTER — Emergency Department (HOSPITAL_COMMUNITY)
Admission: EM | Admit: 2022-02-05 | Discharge: 2022-02-06 | Discharge status: Against Medical Advice | Payer: Commercial Managed Care - HMO | Attending: Emergency Medicine | Admitting: Emergency Medicine

## 2022-02-05 VITALS — BP 200/120 | HR 71 | Temp 98.2°F | Resp 16 | Ht 69.0 in | Wt 164.0 lb

## 2022-02-05 DIAGNOSIS — I739 Peripheral vascular disease, unspecified: Secondary | ICD-10-CM | POA: Diagnosis not present

## 2022-02-05 DIAGNOSIS — Z0001 Encounter for general adult medical examination with abnormal findings: Secondary | ICD-10-CM | POA: Diagnosis not present

## 2022-02-05 DIAGNOSIS — Z23 Encounter for immunization: Secondary | ICD-10-CM

## 2022-02-05 DIAGNOSIS — E119 Type 2 diabetes mellitus without complications: Secondary | ICD-10-CM

## 2022-02-05 DIAGNOSIS — Z79899 Other long term (current) drug therapy: Secondary | ICD-10-CM | POA: Diagnosis not present

## 2022-02-05 DIAGNOSIS — I10 Essential (primary) hypertension: Secondary | ICD-10-CM | POA: Insufficient documentation

## 2022-02-05 DIAGNOSIS — R052 Subacute cough: Secondary | ICD-10-CM

## 2022-02-05 DIAGNOSIS — R200 Anesthesia of skin: Secondary | ICD-10-CM

## 2022-02-05 DIAGNOSIS — N182 Chronic kidney disease, stage 2 (mild): Secondary | ICD-10-CM

## 2022-02-05 DIAGNOSIS — Z7984 Long term (current) use of oral hypoglycemic drugs: Secondary | ICD-10-CM | POA: Insufficient documentation

## 2022-02-05 DIAGNOSIS — E785 Hyperlipidemia, unspecified: Secondary | ICD-10-CM

## 2022-02-05 DIAGNOSIS — E113213 Type 2 diabetes mellitus with mild nonproliferative diabetic retinopathy with macular edema, bilateral: Secondary | ICD-10-CM

## 2022-02-05 DIAGNOSIS — Z720 Tobacco use: Secondary | ICD-10-CM

## 2022-02-05 DIAGNOSIS — F1010 Alcohol abuse, uncomplicated: Secondary | ICD-10-CM

## 2022-02-05 DIAGNOSIS — I1A0 Resistant hypertension: Secondary | ICD-10-CM

## 2022-02-05 DIAGNOSIS — R7989 Other specified abnormal findings of blood chemistry: Secondary | ICD-10-CM

## 2022-02-05 DIAGNOSIS — R9431 Abnormal electrocardiogram [ECG] [EKG]: Secondary | ICD-10-CM

## 2022-02-05 DIAGNOSIS — R202 Paresthesia of skin: Secondary | ICD-10-CM

## 2022-02-05 DIAGNOSIS — R030 Elevated blood-pressure reading, without diagnosis of hypertension: Secondary | ICD-10-CM | POA: Diagnosis present

## 2022-02-05 DIAGNOSIS — E876 Hypokalemia: Secondary | ICD-10-CM

## 2022-02-05 DIAGNOSIS — Z794 Long term (current) use of insulin: Secondary | ICD-10-CM | POA: Diagnosis not present

## 2022-02-05 DIAGNOSIS — E1165 Type 2 diabetes mellitus with hyperglycemia: Secondary | ICD-10-CM

## 2022-02-05 LAB — CBC
HCT: 54.7 % — ABNORMAL HIGH (ref 39.0–52.0)
Hemoglobin: 18.2 g/dL — ABNORMAL HIGH (ref 13.0–17.0)
MCH: 27.8 pg (ref 26.0–34.0)
MCHC: 33.3 g/dL (ref 30.0–36.0)
MCV: 83.5 fL (ref 80.0–100.0)
Platelets: 251 10*3/uL (ref 150–400)
RBC: 6.55 MIL/uL — ABNORMAL HIGH (ref 4.22–5.81)
RDW: 17.2 % — ABNORMAL HIGH (ref 11.5–15.5)
WBC: 6.2 10*3/uL (ref 4.0–10.5)
nRBC: 0 % (ref 0.0–0.2)

## 2022-02-05 LAB — LIPID PANEL
Cholesterol: 162 mg/dL (ref 0–200)
HDL: 68.5 mg/dL (ref >39.00)
LDL Cholesterol: 74 mg/dL (ref 0–99)
NonHDL: 93.28
Total CHOL/HDL Ratio: 2
Triglycerides: 96 mg/dL (ref 0.0–149.0)
VLDL: 19.2 mg/dL (ref 0.0–40.0)

## 2022-02-05 LAB — URINALYSIS, ROUTINE W REFLEX MICROSCOPIC
Ketones, ur: NEGATIVE
Leukocytes,Ua: NEGATIVE
Nitrite: NEGATIVE
Specific Gravity, Urine: >=1.03 — AB (ref 1.000–1.030)
Total Protein, Urine: 100 — AB
Urine Glucose: >=1000 — AB
Urobilinogen, UA: 0.2 (ref 0.0–1.0)
pH: 6 (ref 5.0–8.0)

## 2022-02-05 LAB — BASIC METABOLIC PANEL
Anion gap: 10 (ref 5–15)
BUN: 10 mg/dL (ref 6–20)
CO2: 29 mmol/L (ref 22–32)
Calcium: 9.2 mg/dL (ref 8.9–10.3)
Chloride: 99 mmol/L (ref 98–111)
Creatinine, Ser: 1.2 mg/dL (ref 0.61–1.24)
GFR, Estimated: >60 mL/min (ref >60)
Glucose, Bld: 115 mg/dL — ABNORMAL HIGH (ref 70–99)
Potassium: 3.3 mmol/L — ABNORMAL LOW (ref 3.5–5.1)
Sodium: 138 mmol/L (ref 135–145)

## 2022-02-05 LAB — MAGNESIUM: Magnesium: 2 mg/dL (ref 1.5–2.5)

## 2022-02-05 LAB — BRAIN NATRIURETIC PEPTIDE: Pro B Natriuretic peptide (BNP): 717 pg/mL — ABNORMAL HIGH (ref 0.0–100.0)

## 2022-02-05 LAB — MICROALBUMIN / CREATININE URINE RATIO
Creatinine,U: 250.3 mg/dL
Microalb Creat Ratio: 50.7 mg/g — ABNORMAL HIGH (ref 0.0–30.0)
Microalb, Ur: 126.8 mg/dL — ABNORMAL HIGH (ref 0.0–1.9)

## 2022-02-05 LAB — HEMOGLOBIN A1C: Hgb A1c MFr Bld: 8.8 % — ABNORMAL HIGH (ref 4.6–6.5)

## 2022-02-05 LAB — TROPONIN I (HIGH SENSITIVITY)
High Sens Troponin I: 31 ng/L (ref 2–17)
Troponin I (High Sensitivity): 40 ng/L — ABNORMAL HIGH (ref <18)
Troponin I (High Sensitivity): 38 ng/L — ABNORMAL HIGH (ref <18)

## 2022-02-05 LAB — TSH: TSH: 0.87 u[IU]/mL (ref 0.35–5.50)

## 2022-02-05 LAB — PSA: PSA: 2.22 ng/mL (ref 0.10–4.00)

## 2022-02-05 MED ORDER — HYDRALAZINE HCL 20 MG/ML IJ SOLN
5.0000 mg | Freq: Once | INTRAMUSCULAR | Status: AC
  Administered 2022-02-06: 5 mg via INTRAVENOUS
  Filled 2022-02-05: qty 1

## 2022-02-05 MED ORDER — AMLODIPINE BESYLATE 5 MG PO TABS
10.0000 mg | ORAL_TABLET | Freq: Once | ORAL | Status: AC
  Administered 2022-02-05: 10 mg via ORAL
  Filled 2022-02-05: qty 2

## 2022-02-05 MED ORDER — TRIAMTERENE-HCTZ 37.5-25 MG PO TABS
1.0000 | ORAL_TABLET | Freq: Every day | ORAL | Status: DC
  Administered 2022-02-05: 1 via ORAL
  Filled 2022-02-05: qty 1

## 2022-02-05 MED ORDER — HYDRALAZINE HCL 20 MG/ML IJ SOLN
5.0000 mg | Freq: Once | INTRAMUSCULAR | Status: AC
  Administered 2022-02-05: 5 mg via INTRAVENOUS
  Filled 2022-02-05: qty 1

## 2022-02-05 NOTE — ED Provider Notes (Signed)
[facility] EMERGENCY DEPARTMENT Provider Note   CSN: [id number] Arrival date & time: 02/05/22  1613     History  Chief Complaint  Patient presents with   Hypertension    Jose Morrow is a 56 y.o. male.  Pt sent to the ED for evaltuion of high blood pressure.  Pt saw Dr. Ronnald Ramp today in the office for high blood pressure.  Pt had labs and his troponin was elevated.  Pt reports he did not take his blood pressure medications today.  Pt admits to not taking his medications regularly because they don't work.  Pt is diabetic.    The history is provided by the patient. No language interpreter was used.  Hypertension This is a chronic problem. The problem occurs constantly. The problem has not changed since onset.Pertinent negatives include no chest pain and no abdominal pain. Nothing aggravates the symptoms. Nothing relieves the symptoms. He has tried nothing for the symptoms.       Home Medications Prior to Admission medications   Medication Sig Start Date End Date Taking? Authorizing Provider  amLODipine (NORVASC) 10 MG tablet Take 1 tablet (10 mg total) by mouth daily. 11/10/21 02/08/22 Yes Curatolo, Adam, DO  atorvastatin (LIPITOR) 20 MG tablet Take 1 tablet (20 mg total) by mouth daily. 11/10/21  Yes Curatolo, Adam, DO  insulin aspart (NOVOLOG FLEXPEN) 100 UNIT/ML FlexPen Inject 15 Units into the skin 3 (three) times daily with meals. 11/12/20  Yes Janith Lima, MD  insulin detemir (LEVEMIR) 100 UNIT/ML FlexPen Inject 50 Units into the skin daily. 11/10/21 02/09/22 Yes Curatolo, Adam, DO  Iron, Ferrous Sulfate, 325 (65 Fe) MG TABS Take 325 mg by mouth 2 (two) times daily. 11/12/20  Yes Janith Lima, MD  metFORMIN (GLUCOPHAGE XR) 750 MG 24 hr tablet Take 1 tablet (750 mg total) by mouth daily with breakfast. Patient taking differently: Take 750 mg by mouth every evening. 11/10/21  Yes Curatolo, Adam, DO  tiZANidine (ZANAFLEX) 4 MG tablet Take 1 tablet (4 mg total) by  mouth every 6 (six) hours as needed for muscle spasms. 01/10/22  Yes Piontek, Junie Panning, MD  Accu-Chek Softclix Lancets lancets Use as directed. 06/29/20   Antonieta Pert, MD  Blood Glucose Monitoring Suppl (ACCU-CHEK GUIDE) w/Device KIT Use as directed 11/10/21   Lennice Sites, DO  Continuous Blood Gluc Receiver (FREESTYLE LIBRE 2 READER) DEVI 1 Act by Does not apply route daily. 10/29/20   Janith Lima, MD  Continuous Blood Gluc Sensor (FREESTYLE LIBRE 2 SENSOR) MISC 1 Act by Does not apply route daily. 10/29/20   Janith Lima, MD  Glucagon (GVOKE HYPOPEN 2-PACK) 1 MG/0.2ML SOAJ Inject 1 Act into the skin daily as needed. 10/29/20   Janith Lima, MD  glucose blood (ACCU-CHEK GUIDE) test strip Use as instructed 11/10/21   Curatolo, Adam, DO  Insulin Pen Needle 32G X 4 MM MISC 1 each by Does not apply route 4 (four) times daily -  with meals and at bedtime. 11/10/21   Curatolo, Adam, DO  pantoprazole (PROTONIX) 40 MG tablet Take 1 tablet (40 mg total) by mouth 2 (two) times daily before a meal. Patient not taking: Reported on 02/05/2022 11/10/21 01/09/22  Lennice Sites, DO  triamterene-hydrochlorothiazide (DYAZIDE) 37.5-25 MG capsule Take 1 each (1 capsule total) by mouth daily. Patient not taking: Reported on 02/05/2022 11/10/21   Lennice Sites, DO  hydrochlorothiazide (HYDRODIURIL) 25 MG tablet Take 1 tablet (25 mg total) by mouth daily. 11/23/19 05/01/20  Garald Balding, PA-C      Allergies    Penicillins and Ibuprofen    Review of Systems   Review of Systems  Eyes:  Negative for visual disturbance.  Cardiovascular:  Negative for chest pain.  Gastrointestinal:  Negative for abdominal pain.  Endocrine: Negative for polyuria.  Psychiatric/Behavioral:  The patient is not nervous/anxious.   All other systems reviewed and are negative.   Physical Exam Updated Vital Signs BP (!) 229/122   Pulse 78   Temp 98 F (36.7 C) (Oral)   Resp (!) 30   Ht _0  (1.753 m)   Wt 74.8 kg   SpO2 96%   BMI  24.37 kg/m  Physical Exam Vitals and nursing note reviewed.  Constitutional:      Appearance: He is well-developed.  HENT:     Head: Normocephalic.     Right Ear: Tympanic membrane normal.     Left Ear: Tympanic membrane normal.     Mouth/Throat:     Mouth: Mucous membranes are moist.  Eyes:     Extraocular Movements: Extraocular movements intact.     Pupils: Pupils are equal, round, and reactive to light.  Cardiovascular:     Rate and Rhythm: Normal rate.  Pulmonary:     Effort: Pulmonary effort is normal.  Abdominal:     General: Abdomen is flat. There is no distension.  Musculoskeletal:        General: Normal range of motion.     Cervical back: Normal range of motion.  Skin:    General: Skin is warm.  Neurological:     General: No focal deficit present.     Mental Status: He is alert and oriented to person, place, and time.  Psychiatric:        Mood and Affect: Mood normal.     ED Results / Procedures / Treatments   Labs (all labs ordered are listed, but only abnormal results are displayed) Labs Reviewed  BASIC METABOLIC PANEL - Abnormal; Notable for the following components:      Result Value   Potassium 3.3 (*)    Glucose, Bld 115 (*)    All other components within normal limits  CBC - Abnormal; Notable for the following components:   RBC 6.55 (*)    Hemoglobin 18.2 (*)    HCT 54.7 (*)    RDW 17.2 (*)    All other components within normal limits  TROPONIN I (HIGH SENSITIVITY) - Abnormal; Notable for the following components:   Troponin I (High Sensitivity) 40 (*)    All other components within normal limits  TROPONIN I (HIGH SENSITIVITY) - Abnormal; Notable for the following components:   Troponin I (High Sensitivity) 38 (*)    All other components within normal limits    EKG EKG Interpretation  Date/Time:  Wednesday February 05 2022 18:56:52 EST Ventricular Rate:  72 PR Interval:  178 QRS Duration: 94 QT Interval:  414 QTC Calculation: 453 R  Axis:   10 Text Interpretation: Normal sinus rhythm Possible Left atrial enlargement Left ventricular hypertrophy ( R in aVL , Sokolow-Lyon , Cornell product , Romhilt-Estes ) Cannot rule out Septal infarct , age undetermined Marked ST abnormality, possible lateral subendocardial injury Abnormal ECG No significant change since last tracing Confirmed by Leanord Asal ([id number]) on 02/05/2022 8:49:26 PM  Radiology DG Chest 2 View  Result Date: 02/05/2022 CLINICAL DATA:  Cough EXAM: CHEST - 2 VIEW COMPARISON:  Chest x-ray dated June 16, 2020 FINDINGS: Cardiac  contours are upper limits of normal in size, unchanged when compared with the prior exam. Lungs are clear. No pleural effusion or pneumothorax. IMPRESSION: No active cardiopulmonary disease. Electronically Signed   By: Yetta Glassman M.D.   On: 02/05/2022 12:37    Procedures Procedures    Medications Ordered in ED Medications  triamterene-hydrochlorothiazide (MAXZIDE-25) 37.5-25 MG per tablet 1 tablet (1 tablet Oral Given 02/05/22 2111)  amLODipine (NORVASC) tablet 10 mg (10 mg Oral Given 02/05/22 2111)  hydrALAZINE (APRESOLINE) injection 5 mg (5 mg Intravenous Given 02/05/22 2119)    ED Course/ Medical Decision Making/ A&P                           Medical Decision Making Amount and/or Complexity of Data Reviewed External Data Reviewed: notes.    Details: Primary care notes reviewed  Labs: ordered. Decision-making details documented in ED Course.    Details: Troponin is 40 and 38.   ECG/medicine tests: ordered and independent interpretation performed. Decision-making details documented in ED Course.    Details: EKG unchanged   Risk Prescription drug management.           Final Clinical Impression(s) / ED Diagnoses Final diagnoses:  Resistant hypertension    Rx / DC Orders ED Discharge Orders     None     An After Visit Summary was printed and given to the patient.     Fransico Meadow, PA-C 02/06/22  0012    Leanord Asal K, DO 02/06/22 1451

## 2022-02-05 NOTE — ED Provider Notes (Incomplete)
[facility] EMERGENCY DEPARTMENT Provider Note   CSN: [id number] Arrival date & time: 02/05/22  1613     History {Add pertinent medical, surgical, social history, OB history to HPI:1} Chief Complaint  Patient presents with  . Hypertension    Jose Morrow is a 56 y.o. male.  Pt sent to the ED for evaltuion of high blood pressure.  Pt saw Dr. Ronnald Ramp today in the office for high blood pressure.  Pt had labs and his troponin was elevated.  Pt reports he did not take his blood pressure medications today.  Pt admits to not taking his medications regularly because they don't work.  Pt is diabetic.    The history is provided by the patient. No language interpreter was used.  Hypertension This is a chronic problem. The problem occurs constantly. The problem has not changed since onset.Pertinent negatives include no chest pain and no abdominal pain. Nothing aggravates the symptoms. Nothing relieves the symptoms. He has tried nothing for the symptoms.       Home Medications Prior to Admission medications   Medication Sig Start Date End Date Taking? Authorizing Provider  amLODipine (NORVASC) 10 MG tablet Take 1 tablet (10 mg total) by mouth daily. 11/10/21 02/08/22 Yes Curatolo, Adam, DO  atorvastatin (LIPITOR) 20 MG tablet Take 1 tablet (20 mg total) by mouth daily. 11/10/21  Yes Curatolo, Adam, DO  insulin aspart (NOVOLOG FLEXPEN) 100 UNIT/ML FlexPen Inject 15 Units into the skin 3 (three) times daily with meals. 11/12/20  Yes Janith Lima, MD  insulin detemir (LEVEMIR) 100 UNIT/ML FlexPen Inject 50 Units into the skin daily. 11/10/21 02/09/22 Yes Curatolo, Adam, DO  Iron, Ferrous Sulfate, 325 (65 Fe) MG TABS Take 325 mg by mouth 2 (two) times daily. 11/12/20  Yes Janith Lima, MD  metFORMIN (GLUCOPHAGE XR) 750 MG 24 hr tablet Take 1 tablet (750 mg total) by mouth daily with breakfast. Patient taking differently: Take 750 mg by mouth every evening. 11/10/21  Yes Curatolo,  Adam, DO  tiZANidine (ZANAFLEX) 4 MG tablet Take 1 tablet (4 mg total) by mouth every 6 (six) hours as needed for muscle spasms. 01/10/22  Yes Piontek, Junie Panning, MD  Accu-Chek Softclix Lancets lancets Use as directed. 06/29/20   Antonieta Pert, MD  Blood Glucose Monitoring Suppl (ACCU-CHEK GUIDE) w/Device KIT Use as directed 11/10/21   Lennice Sites, DO  Continuous Blood Gluc Receiver (FREESTYLE LIBRE 2 READER) DEVI 1 Act by Does not apply route daily. 10/29/20   Janith Lima, MD  Continuous Blood Gluc Sensor (FREESTYLE LIBRE 2 SENSOR) MISC 1 Act by Does not apply route daily. 10/29/20   Janith Lima, MD  Glucagon (GVOKE HYPOPEN 2-PACK) 1 MG/0.2ML SOAJ Inject 1 Act into the skin daily as needed. 10/29/20   Janith Lima, MD  glucose blood (ACCU-CHEK GUIDE) test strip Use as instructed 11/10/21   Curatolo, Adam, DO  Insulin Pen Needle 32G X 4 MM MISC 1 each by Does not apply route 4 (four) times daily -  with meals and at bedtime. 11/10/21   Curatolo, Adam, DO  pantoprazole (PROTONIX) 40 MG tablet Take 1 tablet (40 mg total) by mouth 2 (two) times daily before a meal. Patient not taking: Reported on 02/05/2022 11/10/21 01/09/22  Lennice Sites, DO  triamterene-hydrochlorothiazide (DYAZIDE) 37.5-25 MG capsule Take 1 each (1 capsule total) by mouth daily. Patient not taking: Reported on 02/05/2022 11/10/21   Lennice Sites, DO  hydrochlorothiazide (HYDRODIURIL) 25 MG tablet Take 1  tablet (25 mg total) by mouth daily. 11/23/19 05/01/20  Garald Balding, PA-C      Allergies    Penicillins and Ibuprofen    Review of Systems   Review of Systems  Eyes:  Negative for visual disturbance.  Cardiovascular:  Negative for chest pain.  Gastrointestinal:  Negative for abdominal pain.  Endocrine: Negative for polyuria.  Psychiatric/Behavioral:  The patient is not nervous/anxious.   All other systems reviewed and are negative.   Physical Exam Updated Vital Signs BP (!) 229/122   Pulse 78   Temp 98 F (36.7 C)  (Oral)   Resp (!) 30   Ht _0  (1.753 m)   Wt 74.8 kg   SpO2 96%   BMI 24.37 kg/m  Physical Exam Vitals and nursing note reviewed.  Constitutional:      Appearance: He is well-developed.  HENT:     Head: Normocephalic.     Right Ear: Tympanic membrane normal.     Left Ear: Tympanic membrane normal.     Mouth/Throat:     Mouth: Mucous membranes are moist.  Eyes:     Extraocular Movements: Extraocular movements intact.     Pupils: Pupils are equal, round, and reactive to light.  Cardiovascular:     Rate and Rhythm: Normal rate.  Pulmonary:     Effort: Pulmonary effort is normal.  Abdominal:     General: Abdomen is flat. There is no distension.  Musculoskeletal:        General: Normal range of motion.     Cervical back: Normal range of motion.  Skin:    General: Skin is warm.  Neurological:     General: No focal deficit present.     Mental Status: He is alert and oriented to person, place, and time.  Psychiatric:        Mood and Affect: Mood normal.     ED Results / Procedures / Treatments   Labs (all labs ordered are listed, but only abnormal results are displayed) Labs Reviewed  BASIC METABOLIC PANEL - Abnormal; Notable for the following components:      Result Value   Potassium 3.3 (*)    Glucose, Bld 115 (*)    All other components within normal limits  CBC - Abnormal; Notable for the following components:   RBC 6.55 (*)    Hemoglobin 18.2 (*)    HCT 54.7 (*)    RDW 17.2 (*)    All other components within normal limits  TROPONIN I (HIGH SENSITIVITY) - Abnormal; Notable for the following components:   Troponin I (High Sensitivity) 40 (*)    All other components within normal limits  TROPONIN I (HIGH SENSITIVITY) - Abnormal; Notable for the following components:   Troponin I (High Sensitivity) 38 (*)    All other components within normal limits    EKG EKG Interpretation  Date/Time:  Wednesday February 05 2022 18:56:52 EST Ventricular Rate:  72 PR  Interval:  178 QRS Duration: 94 QT Interval:  414 QTC Calculation: 453 R Axis:   10 Text Interpretation: Normal sinus rhythm Possible Left atrial enlargement Left ventricular hypertrophy ( R in aVL , Sokolow-Lyon , Cornell product , Romhilt-Estes ) Cannot rule out Septal infarct , age undetermined Marked ST abnormality, possible lateral subendocardial injury Abnormal ECG No significant change since last tracing Confirmed by Leanord Asal ([id number]) on 02/05/2022 8:49:26 PM  Radiology DG Chest 2 View  Result Date: 02/05/2022 CLINICAL DATA:  Cough EXAM: CHEST - 2 VIEW  COMPARISON:  Chest x-ray dated June 16, 2020 FINDINGS: Cardiac contours are upper limits of normal in size, unchanged when compared with the prior exam. Lungs are clear. No pleural effusion or pneumothorax. IMPRESSION: No active cardiopulmonary disease. Electronically Signed   By: Yetta Glassman M.D.   On: 02/05/2022 12:37    Procedures Procedures  {Document cardiac monitor, telemetry assessment procedure when appropriate:1}  Medications Ordered in ED Medications  triamterene-hydrochlorothiazide (MAXZIDE-25) 37.5-25 MG per tablet 1 tablet (1 tablet Oral Given 02/05/22 2111)  amLODipine (NORVASC) tablet 10 mg (10 mg Oral Given 02/05/22 2111)  hydrALAZINE (APRESOLINE) injection 5 mg (5 mg Intravenous Given 02/05/22 2119)    ED Course/ Medical Decision Making/ A&P                           Medical Decision Making Amount and/or Complexity of Data Reviewed External Data Reviewed: notes.    Details: Primary care notes reviewed  Labs: ordered. Decision-making details documented in ED Course.    Details: Troponin is 40 and 38.   ECG/medicine tests: ordered and independent interpretation performed. Decision-making details documented in ED Course.    Details: EKG unchanged   Risk Prescription drug management.     {Document critical care time when appropriate:1} {Document review of labs and clinical decision tools ie heart  score, Chads2Vasc2 etc:1}  {Document your independent review of radiology images, and any outside records:1} {Document your discussion with family members, caretakers, and with consultants:1} {Document social determinants of health affecting pt's care:1} {Document your decision making why or why not admission, treatments were needed:1} Final Clinical Impression(s) / ED Diagnoses Final diagnoses:  Resistant hypertension    Rx / DC Orders ED Discharge Orders     None

## 2022-02-05 NOTE — ED Notes (Signed)
Call on. This RN entered room and found pt standing at sink attempting to wash hands. Noted yellow liquid in the sink and pt stated that he had to go to the bathroom.

## 2022-02-05 NOTE — Telephone Encounter (Signed)
Spoke with patient today and info given. 

## 2022-02-05 NOTE — ED Notes (Signed)
Provider aware of current vs

## 2022-02-05 NOTE — ED Triage Notes (Signed)
Pt sent by PCP from annual check up today where he was found to be hypertensive despite being on medication and an elevated of troponin of 35.  Pt states he has no complaints at this time.

## 2022-02-05 NOTE — ED Provider Triage Note (Signed)
Emergency Medicine Provider Triage Evaluation Note  Jose Morrow , a 56 y.o. male  was evaluated in triage.  Pt complains of abnormal labs. Pt was seen at PCP office today for annual exam when he was noted to have elevated BP, and labs remarkable for elevated trop of 31.  Pt without complaint and report being complaint with his medication.  BP in the 200s systolic.  No cp, sob, abd pain  Review of Systems  Positive: As above Negative: As above  Physical Exam  There were no vitals taken for this visit. Gen:   Awake, no distress   Resp:  Normal effort  MSK:   Moves extremities without difficulty  Other:    Medical Decision Making  Medically screening exam initiated at 6:43 PM.  Appropriate orders placed.  Jose Morrow was informed that the remainder of the evaluation will be completed by another provider, this initial triage assessment does not replace that evaluation, and the importance of remaining in the ED until their evaluation is complete.  Pt here with elevated trop likely in the setting of uncontrolled HTN. No CP.  Request nurse to find a room for pt to be evaluate promptly.    Fayrene Helper, PA-C 02/05/22 1850

## 2022-02-05 NOTE — Progress Notes (Unsigned)
Subjective:  Patient ID: Jose Morrow, male    DOB: 09/16/1965  Age: 56 y.o. MRN: [id number]  CC: Annual Exam, Hypertension, Diabetes, and Hyperlipidemia   HPI Jose Morrow presents for a CPX and f/up --  He was recently seen in the emergency room for elevated blood pressure and alcohol intoxication.  He complains of weight loss, blurred vision, numbness in his extremities, and claudication.  He tells me he has abstained from alcohol for the last 4 days.  Outpatient Medications Prior to Visit  Medication Sig Dispense Refill   Accu-Chek Softclix Lancets lancets Use as directed. 100 each 5   atorvastatin (LIPITOR) 20 MG tablet Take 1 tablet (20 mg total) by mouth daily. 90 tablet 3   Blood Glucose Monitoring Suppl (ACCU-CHEK GUIDE) w/Device KIT Use as directed 1 kit 0   Glucagon (GVOKE HYPOPEN 2-PACK) 1 MG/0.2ML SOAJ Inject 1 Act into the skin daily as needed. 2 mL 5   glucose blood (ACCU-CHEK GUIDE) test strip Use as instructed 100 each 12   insulin aspart (NOVOLOG FLEXPEN) 100 UNIT/ML FlexPen Inject 15 Units into the skin 3 (three) times daily with meals. 42 mL 1   Insulin Pen Needle 32G X 4 MM MISC 1 each by Does not apply route 4 (four) times daily -  with meals and at bedtime. 300 each 1   Iron, Ferrous Sulfate, 325 (65 Fe) MG TABS Take 325 mg by mouth 2 (two) times daily. 180 tablet 1   tiZANidine (ZANAFLEX) 4 MG tablet Take 1 tablet (4 mg total) by mouth every 6 (six) hours as needed for muscle spasms. 30 tablet 0   amLODipine (NORVASC) 10 MG tablet Take 1 tablet (10 mg total) by mouth daily. 90 tablet 0   Continuous Blood Gluc Receiver (FREESTYLE LIBRE 2 READER) DEVI 1 Act by Does not apply route daily. 2 each 5   Continuous Blood Gluc Sensor (FREESTYLE LIBRE 2 SENSOR) MISC 1 Act by Does not apply route daily. 2 each 5   insulin detemir (LEVEMIR) 100 UNIT/ML FlexPen Inject 50 Units into the skin daily. 45 mL 0   metFORMIN (GLUCOPHAGE XR) 750 MG 24 hr tablet Take 1 tablet (750 mg  total) by mouth daily with breakfast. (Patient taking differently: Take 750 mg by mouth every evening.) 90 tablet 0   triamterene-hydrochlorothiazide (DYAZIDE) 37.5-25 MG capsule Take 1 each (1 capsule total) by mouth daily. (Patient not taking: Reported on 02/05/2022) 90 capsule 0   pantoprazole (PROTONIX) 40 MG tablet Take 1 tablet (40 mg total) by mouth 2 (two) times daily before a meal. (Patient not taking: Reported on 02/05/2022) 60 tablet 1   No facility-administered medications prior to visit.    ROS Review of Systems  Constitutional:  Positive for unexpected weight change. Negative for chills, diaphoresis and fatigue.  HENT: Negative.  Negative for trouble swallowing.   Eyes:  Positive for visual disturbance (BV).  Respiratory:  Positive for cough. Negative for chest tightness, shortness of breath and wheezing.   Cardiovascular: Negative.  Negative for chest pain, palpitations and leg swelling.  Gastrointestinal:  Negative for abdominal pain, diarrhea, nausea and vomiting.  Genitourinary: Negative.  Negative for difficulty urinating and dysuria.  Musculoskeletal: Negative.  Negative for arthralgias and myalgias.  Skin: Negative.  Negative for color change and rash.  Neurological:  Positive for numbness. Negative for dizziness, weakness, light-headedness and headaches.  Hematological: Negative.  Negative for adenopathy. Does not bruise/bleed easily.    Objective:  BP (!) 200/120 (  BP Location: Right Arm, Patient Position: Sitting, Cuff Size: Large)   Pulse 71   Temp 98.2 F (36.8 C) (Oral)   Resp 16   Ht _0  (1.753 m)   Wt 164 lb (74.4 kg)   SpO2 98%   BMI 24.22 kg/m   BP Readings from Last 3 Encounters:  02/06/22 (!) 194/97  02/05/22 (!) 200/120  02/01/22 136/85    Wt Readings from Last 3 Encounters:  02/05/22 165 lb (74.8 kg)  02/05/22 164 lb (74.4 kg)  01/29/22 165 lb 5.5 oz (75 kg)    Physical Exam Vitals reviewed.  HENT:     Mouth/Throat:     Mouth: Mucous  membranes are moist.  Eyes:     General: No scleral icterus.    Conjunctiva/sclera: Conjunctivae normal.  Neck:     Vascular: No carotid bruit.  Cardiovascular:     Rate and Rhythm: Normal rate and regular rhythm.     Pulses: Normal pulses.     Heart sounds: No murmur heard.    Comments: EKG- NSR, 69 bpm LAE LVH with reciprocal changes No Q waves Pulmonary:     Breath sounds: No stridor. Examination of the right-upper field reveals decreased breath sounds. Examination of the left-upper field reveals decreased breath sounds. Examination of the right-middle field reveals decreased breath sounds. Examination of the left-middle field reveals decreased breath sounds. Examination of the right-lower field reveals decreased breath sounds. Examination of the left-lower field reveals decreased breath sounds. Decreased breath sounds present. No wheezing, rhonchi or rales.  Abdominal:     General: Abdomen is flat.     Palpations: There is no mass.     Tenderness: There is no abdominal tenderness. There is no guarding or rebound.     Hernia: No hernia is present.  Musculoskeletal:     Cervical back: Neck supple.     Right lower leg: No edema.     Left lower leg: No edema.  Skin:    General: Skin is warm and dry.  Neurological:     General: No focal deficit present.     Mental Status: He is alert and oriented to person, place, and time. Mental status is at baseline.  Psychiatric:        Mood and Affect: Mood normal.        Behavior: Behavior normal.     Lab Results  Component Value Date   WBC 6.2 02/05/2022   HGB 18.2 (H) 02/05/2022   HCT 54.7 (H) 02/05/2022   PLT 251 02/05/2022   GLUCOSE 115 (H) 02/05/2022   CHOL 162 02/05/2022   TRIG 96.0 02/05/2022   HDL 68.50 02/05/2022   LDLDIRECT 77.0 10/29/2020   LDLCALC 74 02/05/2022   ALT 12 02/01/2022   AST 24 02/01/2022   NA 138 02/05/2022   K 3.3 (L) 02/05/2022   CL 99 02/05/2022   CREATININE 1.20 02/05/2022   BUN 10 02/05/2022    CO2 29 02/05/2022   TSH 0.87 02/05/2022   PSA 2.22 02/05/2022   INR 1.2 06/27/2020   HGBA1C 8.8 (H) 02/05/2022   MICROALBUR 126.8 (H) 02/05/2022    CT Head Wo Contrast  Result Date: 02/01/2022 CLINICAL DATA:  Mental status change, unknown cause EXAM: CT HEAD WITHOUT CONTRAST TECHNIQUE: Contiguous axial images were obtained from the base of the skull through the vertex without intravenous contrast. RADIATION DOSE REDUCTION: This exam was performed according to the departmental dose-optimization program which includes automated exposure control, adjustment of the mA  and/or kV according to patient size and/or use of iterative reconstruction technique. COMPARISON:  11/10/2021 FINDINGS: Brain: No evidence of acute infarction, hemorrhage, hydrocephalus, extra-axial collection or mass lesion/mass effect. Vascular: No hyperdense vessel or unexpected calcification. Skull: Normal. Negative for fracture or focal lesion. Sinuses/Orbits: Opacification noted of the mastoid air cells consistent with bilateral mastoiditis. Mucoperiosteal thickening noted consistent with chronic right maxillary sinusitis. IMPRESSION: No acute intracranial process. Electronically Signed   By: Sammie Bench M.D.   On: 02/01/2022 17:07   DG Chest 2 View  Result Date: 02/05/2022 CLINICAL DATA:  Cough EXAM: CHEST - 2 VIEW COMPARISON:  Chest x-ray dated June 16, 2020 FINDINGS: Cardiac contours are upper limits of normal in size, unchanged when compared with the prior exam. Lungs are clear. No pleural effusion or pneumothorax. IMPRESSION: No active cardiopulmonary disease. Electronically Signed   By: Yetta Glassman M.D.   On: 02/05/2022 12:37   CT Head Wo Contrast  Result Date: 02/01/2022 CLINICAL DATA:  Mental status change, unknown cause EXAM: CT HEAD WITHOUT CONTRAST TECHNIQUE: Contiguous axial images were obtained from the base of the skull through the vertex without intravenous contrast. RADIATION DOSE REDUCTION: This exam was  performed according to the departmental dose-optimization program which includes automated exposure control, adjustment of the mA and/or kV according to patient size and/or use of iterative reconstruction technique. COMPARISON:  11/10/2021 FINDINGS: Brain: No evidence of acute infarction, hemorrhage, hydrocephalus, extra-axial collection or mass lesion/mass effect. Vascular: No hyperdense vessel or unexpected calcification. Skull: Normal. Negative for fracture or focal lesion. Sinuses/Orbits: Opacification noted of the mastoid air cells consistent with bilateral mastoiditis. Mucoperiosteal thickening noted consistent with chronic right maxillary sinusitis. IMPRESSION: No acute intracranial process. Electronically Signed   By: Sammie Bench M.D.   On: 02/01/2022 17:07   DG Foot Complete Right  Result Date: 01/29/2022 CLINICAL DATA:  Diffuse right foot pain 1 month.  No injury. EXAM: RIGHT FOOT COMPLETE - 3 VIEW COMPARISON:  Right ankle 03/02/2020 FINDINGS: Negative for fracture.  No osteomyelitis or acute bony abnormality Mild hallux valgus deformity with degenerative change in the first MTP. Widening between the fourth and fifth metatarsal likely chronic. Otherwise no significant arthropathy. IMPRESSION: 1. No acute abnormality. 2. Mild hallux valgus deformity with degenerative change first MTP. Electronically Signed   By: Franchot Gallo M.D.   On: 01/29/2022 12:23     Assessment & Plan:   Lavere was seen today for annual exam, hypertension, diabetes and hyperlipidemia.  Diagnoses and all orders for this visit:  Hypertension, unspecified type-will work to get better control of his blood pressure. -     Aldosterone + renin activity w/ ratio; Future -     Magnesium; Future -     TSH; Future -     Urinalysis, Routine w reflex microscopic; Future -     EKG 12-Lead -     Urinalysis, Routine w reflex microscopic -     TSH -     Magnesium -     Aldosterone + renin activity w/  ratio  Insulin-requiring or dependent type II diabetes mellitus (HCC)-we will try to get better control of his blood sugar. -     Hemoglobin A1c; Future -     Microalbumin / creatinine urine ratio; Future -     Microalbumin / creatinine urine ratio -     Hemoglobin A1c -     HM Diabetes Foot Exam -     insulin detemir (LEVEMIR) 100 UNIT/ML FlexPen; Inject 50 Units  into the skin daily. -     Continuous Blood Gluc Sensor (FREESTYLE LIBRE 2 SENSOR) MISC; 1 Act by Does not apply route daily. -     Continuous Blood Gluc Receiver (FREESTYLE LIBRE 2 READER) DEVI; 1 Act by Does not apply route daily. -     dapagliflozin propanediol (FARXIGA) 10 MG TABS tablet; Take 1 tablet (10 mg total) by mouth daily before breakfast.  Hyperlipidemia with target LDL less than 100- LDL goal achieved. Doing well on the statin  -     Lipid panel; Future -     TSH; Future -     TSH -     Lipid panel  Claudication of both lower extremities (HCC) -     VAS [place] ABI WITH/WO TBI; Future  Continuous tobacco abuse  Hypokalemia -     Aldosterone + renin activity w/ ratio; Future -     Magnesium; Future -     Magnesium -     Aldosterone + renin activity w/ ratio  Encounter for general adult medical examination with abnormal findings- Exam completed, labs reviewed, vaccines reviewed and updated, cancer screenings addressed. -     PSA; Future -     PSA  Subacute cough- Chest x-ray is negative for mass or infiltrate. -     DG Chest 2 View; Future  Abnormal electrocardiogram (ECG) (EKG)- Troponin and BNP are normal. 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 -     dapagliflozin propanediol (FARXIGA) 10 MG TABS tablet; Take 1 tablet (10 mg total) by mouth daily before breakfast.  Chronic renal disease, stage 2, mildly decreased glomerular filtration rate (GFR) between 60-89 mL/min/1.73 square meter -     dapagliflozin propanediol (FARXIGA) 10 MG TABS tablet; Take 1 tablet (10 mg total) by mouth daily before breakfast.  Malignant hypertension  Elevated brain natriuretic peptide (BNP) level -     ECHOCARDIOGRAM COMPLETE; Future  Abnormal finding on EKG -     ECHOCARDIOGRAM COMPLETE; Future  Other orders -     Flu Vaccine QUAD 6+ mos PF IM (Fluarix Quad PF)   I have changed Nicole Kindred L. Vicuna's metFORMIN. I am also having him start on dapagliflozin propanediol. Additionally, I am having him maintain his Accu-Chek Softclix Lancets, Gvoke HypoPen 2-Pack, NovoLOG FlexPen, Iron (Ferrous Sulfate), atorvastatin, Accu-Chek Guide, Accu-Chek Guide, Insulin Pen Needle, pantoprazole, tiZANidine, insulin detemir, FreeStyle Libre 2 Sensor, and [employer] 2 Reader.  Meds ordered this encounter  Medications   metFORMIN (GLUCOPHAGE XR) 750 MG 24 hr tablet    Sig: Take 2 tablets (1,500 mg total) by mouth daily with breakfast.    Dispense:  180 tablet    Refill:  0   insulin detemir (LEVEMIR) 100 UNIT/ML FlexPen    Sig: Inject 50 Units into the skin daily.  Dispense:  45 mL    Refill:  0   Continuous Blood Gluc Sensor (FREESTYLE LIBRE 2 SENSOR) MISC    Sig: 1 Act by Does not apply route daily.    Dispense:  2 each    Refill:  5   Continuous Blood Gluc Receiver (FREESTYLE LIBRE 2 READER) DEVI    Sig: 1 Act by Does not apply route daily.    Dispense:  2 each    Refill:  5   dapagliflozin propanediol (FARXIGA) 10 MG TABS tablet    Sig: Take 1 tablet (10 mg total) by mouth daily  before breakfast.    Dispense:  90 tablet    Refill:  1   In addition to time spent on CPE, I spent 50 minutes in preparing to see the patient by review of recent labs, imaging and procedures, obtaining and reviewing separately obtained history, communicating with the patient, ordering medications, tests or procedures, and documenting clinical information in the EHR including the differential Dx, treatment, and any further evaluation and management of multiple complex medical issues.      Follow-up: No follow-ups on file.  Scarlette Calico, MD

## 2022-02-06 DIAGNOSIS — N182 Chronic kidney disease, stage 2 (mild): Secondary | ICD-10-CM | POA: Insufficient documentation

## 2022-02-06 MED ORDER — FREESTYLE LIBRE 2 READER DEVI: 1.0000 | Freq: Every day | 5 refills | Status: AC

## 2022-02-06 MED ORDER — METFORMIN HCL ER 750 MG PO TB24: 1500.0000 mg | ORAL_TABLET | Freq: Every day | ORAL | 0 refills | Status: DC

## 2022-02-06 MED ORDER — DAPAGLIFLOZIN PROPANEDIOL 10 MG PO TABS: 10.0000 mg | ORAL_TABLET | Freq: Every day | ORAL | 1 refills | Status: DC

## 2022-02-06 MED ORDER — TRIAMTERENE-HCTZ 37.5-25 MG PO CAPS: 1.0000 | ORAL_CAPSULE | Freq: Every day | ORAL | 0 refills | Status: AC

## 2022-02-06 MED ORDER — AMLODIPINE BESYLATE 10 MG PO TABS: 10.0000 mg | ORAL_TABLET | Freq: Every day | ORAL | 2 refills | Status: AC

## 2022-02-06 MED ORDER — FREESTYLE LIBRE 2 SENSOR MISC: 1.0000 | Freq: Every day | 5 refills | Status: AC

## 2022-02-06 MED ORDER — LISINOPRIL 5 MG PO TABS: 5.0000 mg | ORAL_TABLET | Freq: Every day | ORAL | 2 refills | Status: AC

## 2022-02-06 MED ORDER — AMLODIPINE BESYLATE 10 MG PO TABS: 10.0000 mg | ORAL_TABLET | Freq: Every day | ORAL | 0 refills | Status: DC

## 2022-02-06 MED ORDER — INSULIN DETEMIR 100 UNIT/ML FLEXPEN: 50.0000 [IU] | PEN_INJECTOR | Freq: Every day | SUBCUTANEOUS | 0 refills | Status: AC

## 2022-02-06 NOTE — Discharge Instructions (Signed)
Take your blood pressure medications daily as directed.  See your Physician for recheck

## 2022-02-06 NOTE — ED Notes (Signed)
Sitting up at the foot of the bed on the phone. Pt had removed the monitor cables. Pt was placed back on the monitor and pt was asked to slide back up in the bed. Pt requested for one side rail to be down and one was placed down

## 2022-02-07 DIAGNOSIS — R9431 Abnormal electrocardiogram [ECG] [EKG]: Secondary | ICD-10-CM | POA: Insufficient documentation

## 2022-02-07 DIAGNOSIS — R7989 Other specified abnormal findings of blood chemistry: Secondary | ICD-10-CM | POA: Insufficient documentation

## 2022-02-09 DIAGNOSIS — I10 Essential (primary) hypertension: Secondary | ICD-10-CM | POA: Insufficient documentation

## 2022-02-10 LAB — ALDOSTERONE + RENIN ACTIVITY W/ RATIO
ALDO / PRA Ratio: 6.5 Ratio (ref 0.9–28.9)
Aldosterone: 2 ng/dL
Renin Activity: 0.31 ng/mL/h (ref 0.25–5.82)

## 2022-02-28 ENCOUNTER — Ambulatory Visit (HOSPITAL_COMMUNITY)
Admission: RE | Admit: 2022-02-28 | Discharge: 2022-02-28 | Disposition: A | Discharge status: Home / Self Care | Payer: Commercial Managed Care - HMO | Source: Ambulatory Visit | Attending: Internal Medicine | Admitting: Internal Medicine

## 2022-02-28 ENCOUNTER — Other Ambulatory Visit: Payer: Self-pay | Admitting: Internal Medicine

## 2022-02-28 DIAGNOSIS — I739 Peripheral vascular disease, unspecified: Secondary | ICD-10-CM | POA: Diagnosis not present

## 2022-03-04 ENCOUNTER — Ambulatory Visit: Payer: Commercial Managed Care - HMO | Admitting: Internal Medicine

## 2022-03-17 ENCOUNTER — Telehealth (HOSPITAL_COMMUNITY): Payer: Self-pay | Admitting: Internal Medicine

## 2022-03-17 NOTE — Telephone Encounter (Signed)
We have attempted to contact the ordering providers office to obtain a prior authorization for the ordered test, ECHOCARDIOGRAM.  However, we have been unsuccesful. Order will be removed from the active order WQ. Once the prior authorization is obtained we will reinstate the order and schedule patient.   Thank you 03/17/22 Order removed from [place] due to no response from ordering MD for PA#- not made in chart. [facility]  03/10/22 Inbasket sent for PA# x 4  02/20/22 Inbasket sent for PA# x 3  02/14/22 Inbasket sent for PA# x2  02/07/22 Inbasket sent for PA#     Thank you

## 2022-03-20 ENCOUNTER — Other Ambulatory Visit: Payer: Self-pay

## 2022-03-20 ENCOUNTER — Encounter: Payer: Self-pay | Admitting: Vascular Surgery

## 2022-03-20 ENCOUNTER — Ambulatory Visit: Payer: Commercial Managed Care - HMO | Admitting: Vascular Surgery

## 2022-03-20 VITALS — BP 228/123 | HR 86 | Temp 98.5°F | Resp 20 | Ht 69.0 in | Wt 160.0 lb

## 2022-03-20 DIAGNOSIS — I70299 Other atherosclerosis of native arteries of extremities, unspecified extremity: Secondary | ICD-10-CM

## 2022-03-20 DIAGNOSIS — I70219 Atherosclerosis of native arteries of extremities with intermittent claudication, unspecified extremity: Secondary | ICD-10-CM

## 2022-03-20 DIAGNOSIS — L97909 Non-pressure chronic ulcer of unspecified part of unspecified lower leg with unspecified severity: Secondary | ICD-10-CM

## 2022-03-20 DIAGNOSIS — I70229 Atherosclerosis of native arteries of extremities with rest pain, unspecified extremity: Secondary | ICD-10-CM | POA: Diagnosis not present

## 2022-03-20 NOTE — H&P (View-Only) (Signed)
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  He has rest pain on the right and also a small wound on the dorsum of his foot.  Given his history of diabetes this is a limb threatening problem.  I have recommended that we proceed with arteriography.  I have reviewed with the patient the indications for arteriography. In addition, I have reviewed the potential complications of arteriography including but not limited to: Bleeding, arterial injury, arterial thrombosis, dye action, renal insufficiency, or other unpredictable medical problems. I have explained to the patient that if we find disease amenable to angioplasty we could potentially address this at the same time. I have discussed the potential complications of angioplasty and stenting, including but not limited to: Bleeding, arterial thrombosis, arterial injury, dissection, or the need for surgical intervention.  His arteriogram is scheduled for 03/28/2022.  I will make further recommendations pending these results.  If he is not a candidate for an endovascular intervention but is a candidate for a bypass he will need preoperative vein mapping.  He is on a statin.  I have instructed him to begin taking 81 mg of aspirin daily.  We have discussed the importance of tobacco cessation (3 min).  HYPERTENSION: The patient's initial blood pressure today was elevated. We repeated this and this was still elevated. We have encouraged the patient to follow up with their primary care physician for management of their blood pressure.  Recommend the following which can slow the progression of atherosclerosis and reduce the risk of major adverse cardiac / limb events:  Aspirin 81mg  PO QD.  Atorvastatin 40-80mg  PO QD (or other "high intensity" statin therapy). Complete cessation from all tobacco products. Blood glucose control with goal A1c <  7%. Blood pressure control with goal blood pressure < 140/90 mmHg. Lipid reduction therapy with goal LDL-C <100 mg/dL (<70 if symptomatic from PAD).    REASON FOR CONSULT:    To evaluate for peripheral arterial disease.  The consult is requested by Dr. Scarlette Calico.  HPI:   Jose Morrow is a 57 y.o. male who presents for evaluation of peripheral arterial disease.  He noted the gradual onset of pain in his right foot about 3 months ago.  His symptoms are brought on by ambulation and relieved with rest.  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  He has also developed rest pain in the right foot.  He gets pain in the foot which is alleviated by hanging the foot down.  He also has a very small wound on the dorsum of his right foot which is new.  His risk factors for peripheral arterial disease include type 2 diabetes, hypertension, hypercholesterolemia, tobacco use, and a family history of premature cardiovascular disease.  He smokes 1-1/2 packs/day and has been smoking for 40 years.  In addition he has poorly controlled blood pressure.  He has a history of mild chronic kidney disease.  Past Medical History:  Diagnosis Date   Diabetes mellitus    Hypertension     History reviewed. No pertinent family history.  SOCIAL HISTORY: Social History   Tobacco Use   Smoking status: Every Day    Packs/day: 1.50    Years: 40.00    Total pack years: 60.00    Types: Cigarettes   Smokeless tobacco: Never  Substance Use Topics   Alcohol use: Yes  Comment: occasionally    Allergies  Allergen Reactions   Penicillins Anaphylaxis   Ibuprofen Other (See Comments)    Pt states caused gastric ulcer    Current Outpatient Medications  Medication Sig Dispense Refill   Accu-Chek Softclix Lancets lancets Use as directed. 100 each 5   amLODipine (NORVASC) 10 MG tablet Take 1 tablet (10 mg total) by mouth daily. 30 tablet 2   atorvastatin (LIPITOR) 20 MG tablet Take 1 tablet (20 mg  total) by mouth daily. 90 tablet 3   Blood Glucose Monitoring Suppl (ACCU-CHEK GUIDE) w/Device KIT Use as directed 1 kit 0   Continuous Blood Gluc Receiver (FREESTYLE LIBRE 2 READER) DEVI 1 Act by Does not apply route daily. 2 each 5   Continuous Blood Gluc Sensor (FREESTYLE LIBRE 2 SENSOR) MISC 1 Act by Does not apply route daily. 2 each 5   dapagliflozin propanediol (FARXIGA) 10 MG TABS tablet Take 1 tablet (10 mg total) by mouth daily before breakfast. 90 tablet 1   Glucagon (GVOKE HYPOPEN 2-PACK) 1 MG/0.2ML SOAJ Inject 1 Act into the skin daily as needed. 2 mL 5   glucose blood (ACCU-CHEK GUIDE) test strip Use as instructed 100 each 12   insulin aspart (NOVOLOG FLEXPEN) 100 UNIT/ML FlexPen Inject 15 Units into the skin 3 (three) times daily with meals. 42 mL 1   insulin detemir (LEVEMIR) 100 UNIT/ML FlexPen Inject 50 Units into the skin daily. 45 mL 0   Insulin Pen Needle 32G X 4 MM MISC 1 each by Does not apply route 4 (four) times daily -  with meals and at bedtime. 300 each 1   Iron, Ferrous Sulfate, 325 (65 Fe) MG TABS Take 325 mg by mouth 2 (two) times daily. 180 tablet 1   lisinopril (ZESTRIL) 5 MG tablet Take 1 tablet (5 mg total) by mouth daily. 90 tablet 2   metFORMIN (GLUCOPHAGE XR) 750 MG 24 hr tablet Take 2 tablets (1,500 mg total) by mouth daily with breakfast. 180 tablet 0   tiZANidine (ZANAFLEX) 4 MG tablet Take 1 tablet (4 mg total) by mouth every 6 (six) hours as needed for muscle spasms. 30 tablet 0   triamterene-hydrochlorothiazide (DYAZIDE) 37.5-25 MG capsule Take 1 each (1 capsule total) by mouth daily. 90 capsule 0   pantoprazole (PROTONIX) 40 MG tablet Take 1 tablet (40 mg total) by mouth 2 (two) times daily before a meal. (Patient not taking: Reported on 02/05/2022) 60 tablet 1   No current facility-administered medications for this visit.    REVIEW OF SYSTEMS:  [X]  denotes positive finding, [ ]  denotes negative finding Cardiac  Comments:  Chest pain or chest  pressure:    Shortness of breath upon exertion:    Short of breath when lying flat:    Irregular heart rhythm: x       Vascular    Pain in calf, thigh, or hip brought on by ambulation: x   Pain in feet at night that wakes you up from your sleep:  x   Blood clot in your veins:    Leg swelling:  x       Pulmonary    Oxygen at home:    Productive cough:     Wheezing:         Neurologic    Sudden weakness in arms or legs:     Sudden numbness in arms or legs:     Sudden onset of difficulty speaking or slurred speech:    Temporary loss  of vision in one eye:     Problems with dizziness:         Gastrointestinal    Blood in stool:     Vomited blood:         Genitourinary    Burning when urinating:     Blood in urine:        Psychiatric    Major depression:  x       Hematologic    Bleeding problems:    Problems with blood clotting too easily:        Skin    Rashes or ulcers:        Constitutional    Fever or chills:    -  PHYSICAL EXAM:   Vitals:   03/20/22 1443  BP: (!) 228/123  Pulse: 86  Resp: 20  Temp: 98.5 F (36.9 C)  SpO2: 96%  Weight: 160 lb (72.6 kg)  Height: 5\' 9"  (1.753 m)   Body mass index is 23.63 kg/m. GENERAL: The patient is a well-nourished male, in no acute distress. The vital signs are documented above. CARDIAC: There is a regular rate and rhythm.  VASCULAR: I do not detect carotid bruits.   On the right side he has a palpable femoral pulse.  I cannot palpate a popliteal or pedal pulses.   On the left side he has a palpable femoral, popliteal, and posterior tibial pulse.  He also has a palpable dorsalis pedis pulse.   PULMONARY: There is good air exchange bilaterally without wheezing or rales. ABDOMEN: Soft and non-tender with normal pitched bowel sounds.  MUSCULOSKELETAL: There are no major deformities. NEUROLOGIC: No focal weakness or paresthesias are detected. SKIN: He has a wound on the right foot as noted below.  PSYCHIATRIC: The  patient has a normal affect.  DATA:    ARTERIAL DOPPLER STUDY: I have reviewed the arterial Doppler study that was done on 02/28/2022.  On the right side there was a monophasic dorsalis pedis signal with the Doppler.  The posterior tibial signal was absent.  ABI was 33%.  Toe pressure was 0.  On the left side there was a triphasic posterior tibial signal.  The dorsalis pedis signal is monophasic.  ABI was 100% although possibly falsely elevated secondary to calcific disease.  Toe pressure was 160 mmHg.  LABS: I reviewed the labs from 02/05/2022.  GFR was greater than 60.  Creatinine was 1.2.    Deitra Mayo [facility]

## 2022-03-20 NOTE — Progress Notes (Signed)
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  He has rest pain on the right and also a small wound on the dorsum of his foot.  Given his history of diabetes this is a limb threatening problem.  I have recommended that we proceed with arteriography.  I have reviewed with the patient the indications for arteriography. In addition, I have reviewed the potential complications of arteriography including but not limited to: Bleeding, arterial injury, arterial thrombosis, dye action, renal insufficiency, or other unpredictable medical problems. I have explained to the patient that if we find disease amenable to angioplasty we could potentially address this at the same time. I have discussed the potential complications of angioplasty and stenting, including but not limited to: Bleeding, arterial thrombosis, arterial injury, dissection, or the need for surgical intervention.  His arteriogram is scheduled for 03/28/2022.  I will make further recommendations pending these results.  If he is not a candidate for an endovascular intervention but is a candidate for a bypass he will need preoperative vein mapping.  He is on a statin.  I have instructed him to begin taking 81 mg of aspirin daily.  We have discussed the importance of tobacco cessation (3 min).  HYPERTENSION: The patient's initial blood pressure today was elevated. We repeated this and this was still elevated. We have encouraged the patient to follow up with their primary care physician for management of their blood pressure.  Recommend the following which can slow the progression of atherosclerosis and reduce the risk of major adverse cardiac / limb events:  Aspirin 81mg  PO QD.  Atorvastatin 40-80mg  PO QD (or other "high intensity" statin therapy). Complete cessation from all tobacco products. Blood glucose control with goal A1c <  7%. Blood pressure control with goal blood pressure < 140/90 mmHg. Lipid reduction therapy with goal LDL-C <100 mg/dL (<70 if symptomatic from PAD).    REASON FOR CONSULT:    To evaluate for peripheral arterial disease.  The consult is requested by Dr. Scarlette Calico.  HPI:   Jose Morrow is a 57 y.o. male who presents for evaluation of peripheral arterial disease.  He noted the gradual onset of pain in his right foot about 3 months ago.  His symptoms are brought on by ambulation and relieved with rest.  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  He has also developed rest pain in the right foot.  He gets pain in the foot which is alleviated by hanging the foot down.  He also has a very small wound on the dorsum of his right foot which is new.  His risk factors for peripheral arterial disease include type 2 diabetes, hypertension, hypercholesterolemia, tobacco use, and a family history of premature cardiovascular disease.  He smokes 1-1/2 packs/day and has been smoking for 40 years.  In addition he has poorly controlled blood pressure.  He has a history of mild chronic kidney disease.  Past Medical History:  Diagnosis Date   Diabetes mellitus    Hypertension     History reviewed. No pertinent family history.  SOCIAL HISTORY: Social History   Tobacco Use   Smoking status: Every Day    Packs/day: 1.50    Years: 40.00    Total pack years: 60.00    Types: Cigarettes   Smokeless tobacco: Never  Substance Use Topics   Alcohol use: Yes  Comment: occasionally    Allergies  Allergen Reactions   Penicillins Anaphylaxis   Ibuprofen Other (See Comments)    Pt states caused gastric ulcer    Current Outpatient Medications  Medication Sig Dispense Refill   Accu-Chek Softclix Lancets lancets Use as directed. 100 each 5   amLODipine (NORVASC) 10 MG tablet Take 1 tablet (10 mg total) by mouth daily. 30 tablet 2   atorvastatin (LIPITOR) 20 MG tablet Take 1 tablet (20 mg  total) by mouth daily. 90 tablet 3   Blood Glucose Monitoring Suppl (ACCU-CHEK GUIDE) w/Device KIT Use as directed 1 kit 0   Continuous Blood Gluc Receiver (FREESTYLE LIBRE 2 READER) DEVI 1 Act by Does not apply route daily. 2 each 5   Continuous Blood Gluc Sensor (FREESTYLE LIBRE 2 SENSOR) MISC 1 Act by Does not apply route daily. 2 each 5   dapagliflozin propanediol (FARXIGA) 10 MG TABS tablet Take 1 tablet (10 mg total) by mouth daily before breakfast. 90 tablet 1   Glucagon (GVOKE HYPOPEN 2-PACK) 1 MG/0.2ML SOAJ Inject 1 Act into the skin daily as needed. 2 mL 5   glucose blood (ACCU-CHEK GUIDE) test strip Use as instructed 100 each 12   insulin aspart (NOVOLOG FLEXPEN) 100 UNIT/ML FlexPen Inject 15 Units into the skin 3 (three) times daily with meals. 42 mL 1   insulin detemir (LEVEMIR) 100 UNIT/ML FlexPen Inject 50 Units into the skin daily. 45 mL 0   Insulin Pen Needle 32G X 4 MM MISC 1 each by Does not apply route 4 (four) times daily -  with meals and at bedtime. 300 each 1   Iron, Ferrous Sulfate, 325 (65 Fe) MG TABS Take 325 mg by mouth 2 (two) times daily. 180 tablet 1   lisinopril (ZESTRIL) 5 MG tablet Take 1 tablet (5 mg total) by mouth daily. 90 tablet 2   metFORMIN (GLUCOPHAGE XR) 750 MG 24 hr tablet Take 2 tablets (1,500 mg total) by mouth daily with breakfast. 180 tablet 0   tiZANidine (ZANAFLEX) 4 MG tablet Take 1 tablet (4 mg total) by mouth every 6 (six) hours as needed for muscle spasms. 30 tablet 0   triamterene-hydrochlorothiazide (DYAZIDE) 37.5-25 MG capsule Take 1 each (1 capsule total) by mouth daily. 90 capsule 0   pantoprazole (PROTONIX) 40 MG tablet Take 1 tablet (40 mg total) by mouth 2 (two) times daily before a meal. (Patient not taking: Reported on 02/05/2022) 60 tablet 1   No current facility-administered medications for this visit.    REVIEW OF SYSTEMS:  [X]  denotes positive finding, [ ]  denotes negative finding Cardiac  Comments:  Chest pain or chest  pressure:    Shortness of breath upon exertion:    Short of breath when lying flat:    Irregular heart rhythm: x       Vascular    Pain in calf, thigh, or hip brought on by ambulation: x   Pain in feet at night that wakes you up from your sleep:  x   Blood clot in your veins:    Leg swelling:  x       Pulmonary    Oxygen at home:    Productive cough:     Wheezing:         Neurologic    Sudden weakness in arms or legs:     Sudden numbness in arms or legs:     Sudden onset of difficulty speaking or slurred speech:    Temporary loss  of vision in one eye:     Problems with dizziness:         Gastrointestinal    Blood in stool:     Vomited blood:         Genitourinary    Burning when urinating:     Blood in urine:        Psychiatric    Major depression:  x       Hematologic    Bleeding problems:    Problems with blood clotting too easily:        Skin    Rashes or ulcers:        Constitutional    Fever or chills:    -  PHYSICAL EXAM:   Vitals:   03/20/22 1443  BP: (!) 228/123  Pulse: 86  Resp: 20  Temp: 98.5 F (36.9 C)  SpO2: 96%  Weight: 160 lb (72.6 kg)  Height: 5\' 9"  (1.753 m)   Body mass index is 23.63 kg/m. GENERAL: The patient is a well-nourished male, in no acute distress. The vital signs are documented above. CARDIAC: There is a regular rate and rhythm.  VASCULAR: I do not detect carotid bruits.   On the right side he has a palpable femoral pulse.  I cannot palpate a popliteal or pedal pulses.   On the left side he has a palpable femoral, popliteal, and posterior tibial pulse.  He also has a palpable dorsalis pedis pulse.   PULMONARY: There is good air exchange bilaterally without wheezing or rales. ABDOMEN: Soft and non-tender with normal pitched bowel sounds.  MUSCULOSKELETAL: There are no major deformities. NEUROLOGIC: No focal weakness or paresthesias are detected. SKIN: There are no ulcers or rashes noted. PSYCHIATRIC: The patient has a  normal affect.  DATA:    ARTERIAL DOPPLER STUDY: I have reviewed the arterial Doppler study that was done on 02/28/2022.  On the right side there was a monophasic dorsalis pedis signal with the Doppler.  The posterior tibial signal was absent.  ABI was 33%.  Toe pressure was 0.  On the left side there was a triphasic posterior tibial signal.  The dorsalis pedis signal is monophasic.  ABI was 100% although possibly falsely elevated secondary to calcific disease.  Toe pressure was 160 mmHg.  LABS: I reviewed the labs from 02/05/2022.  GFR was greater than 60.  Creatinine was 1.2.    Deitra Mayo [facility]

## 2022-03-28 ENCOUNTER — Ambulatory Visit (HOSPITAL_COMMUNITY)
Admission: RE | Admit: 2022-03-28 | Discharge: 2022-03-28 | Disposition: A | Discharge status: Home / Self Care | Payer: Commercial Managed Care - HMO | Source: Ambulatory Visit | Attending: Vascular Surgery | Admitting: Vascular Surgery

## 2022-03-28 ENCOUNTER — Other Ambulatory Visit: Payer: Self-pay

## 2022-03-28 ENCOUNTER — Encounter (HOSPITAL_COMMUNITY)
Admission: RE | Disposition: A | Discharge status: Home / Self Care | Payer: Self-pay | Source: Ambulatory Visit | Attending: Vascular Surgery

## 2022-03-28 ENCOUNTER — Ambulatory Visit (HOSPITAL_BASED_OUTPATIENT_CLINIC_OR_DEPARTMENT_OTHER): Payer: Commercial Managed Care - HMO

## 2022-03-28 DIAGNOSIS — E7800 Pure hypercholesterolemia, unspecified: Secondary | ICD-10-CM | POA: Insufficient documentation

## 2022-03-28 DIAGNOSIS — F17210 Nicotine dependence, cigarettes, uncomplicated: Secondary | ICD-10-CM | POA: Insufficient documentation

## 2022-03-28 DIAGNOSIS — L97519 Non-pressure chronic ulcer of other part of right foot with unspecified severity: Secondary | ICD-10-CM | POA: Insufficient documentation

## 2022-03-28 DIAGNOSIS — I129 Hypertensive chronic kidney disease with stage 1 through stage 4 chronic kidney disease, or unspecified chronic kidney disease: Secondary | ICD-10-CM | POA: Diagnosis not present

## 2022-03-28 DIAGNOSIS — I70235 Atherosclerosis of native arteries of right leg with ulceration of other part of foot: Secondary | ICD-10-CM | POA: Diagnosis not present

## 2022-03-28 DIAGNOSIS — I70221 Atherosclerosis of native arteries of extremities with rest pain, right leg: Secondary | ICD-10-CM | POA: Diagnosis not present

## 2022-03-28 DIAGNOSIS — E11621 Type 2 diabetes mellitus with foot ulcer: Secondary | ICD-10-CM | POA: Insufficient documentation

## 2022-03-28 DIAGNOSIS — Z794 Long term (current) use of insulin: Secondary | ICD-10-CM | POA: Diagnosis not present

## 2022-03-28 DIAGNOSIS — Z01810 Encounter for preprocedural cardiovascular examination: Secondary | ICD-10-CM

## 2022-03-28 DIAGNOSIS — E1122 Type 2 diabetes mellitus with diabetic chronic kidney disease: Secondary | ICD-10-CM | POA: Insufficient documentation

## 2022-03-28 DIAGNOSIS — N189 Chronic kidney disease, unspecified: Secondary | ICD-10-CM | POA: Insufficient documentation

## 2022-03-28 DIAGNOSIS — E1151 Type 2 diabetes mellitus with diabetic peripheral angiopathy without gangrene: Secondary | ICD-10-CM | POA: Insufficient documentation

## 2022-03-28 DIAGNOSIS — Z79899 Other long term (current) drug therapy: Secondary | ICD-10-CM | POA: Diagnosis not present

## 2022-03-28 DIAGNOSIS — Z7984 Long term (current) use of oral hypoglycemic drugs: Secondary | ICD-10-CM | POA: Insufficient documentation

## 2022-03-28 DIAGNOSIS — I70219 Atherosclerosis of native arteries of extremities with intermittent claudication, unspecified extremity: Secondary | ICD-10-CM

## 2022-03-28 HISTORY — PX: ABDOMINAL AORTOGRAM W/LOWER EXTREMITY: CATH118223

## 2022-03-28 LAB — POCT I-STAT, CHEM 8
BUN: 15 mg/dL (ref 6–20)
Calcium, Ion: 1.15 mmol/L (ref 1.15–1.40)
Chloride: 98 mmol/L (ref 98–111)
Creatinine, Ser: 1.2 mg/dL (ref 0.61–1.24)
Glucose, Bld: 169 mg/dL — ABNORMAL HIGH (ref 70–99)
HCT: 61 % — ABNORMAL HIGH (ref 39.0–52.0)
Hemoglobin: 20.7 g/dL — ABNORMAL HIGH (ref 13.0–17.0)
Potassium: 3.3 mmol/L — ABNORMAL LOW (ref 3.5–5.1)
Sodium: 138 mmol/L (ref 135–145)
TCO2: 27 mmol/L (ref 22–32)

## 2022-03-28 LAB — GLUCOSE, CAPILLARY
Glucose-Capillary: 124 mg/dL — ABNORMAL HIGH (ref 70–99)
Glucose-Capillary: 173 mg/dL — ABNORMAL HIGH (ref 70–99)

## 2022-03-28 SURGERY — ABDOMINAL AORTOGRAM W/LOWER EXTREMITY
Anesthesia: LOCAL

## 2022-03-28 MED ORDER — LABETALOL HCL 5 MG/ML IV SOLN
10.0000 mg | INTRAVENOUS | Status: DC | PRN
  Administered 2022-03-28: 10 mg via INTRAVENOUS
  Filled 2022-03-28 (×2): qty 4

## 2022-03-28 MED ORDER — SODIUM CHLORIDE 0.9 % IV SOLN: 250.0000 mL | INTRAVENOUS | Status: DC | PRN

## 2022-03-28 MED ORDER — MIDAZOLAM HCL 2 MG/2ML IJ SOLN
INTRAMUSCULAR | Status: DC | PRN
  Administered 2022-03-28: 1 mg via INTRAVENOUS

## 2022-03-28 MED ORDER — FENTANYL CITRATE (PF) 100 MCG/2ML IJ SOLN
INTRAMUSCULAR | Status: DC | PRN
  Administered 2022-03-28: 50 ug via INTRAVENOUS

## 2022-03-28 MED ORDER — LIDOCAINE HCL (PF) 1 % IJ SOLN
INTRAMUSCULAR | Status: AC
  Filled 2022-03-28: qty 30

## 2022-03-28 MED ORDER — SODIUM CHLORIDE 0.9% FLUSH: 3.0000 mL | Freq: Two times a day (BID) | INTRAVENOUS | Status: DC

## 2022-03-28 MED ORDER — HEPARIN (PORCINE) IN NACL 1000-0.9 UT/500ML-% IV SOLN
INTRAVENOUS | Status: DC | PRN
  Administered 2022-03-28 (×2): 500 mL

## 2022-03-28 MED ORDER — IODIXANOL 320 MG/ML IV SOLN
INTRAVENOUS | Status: DC | PRN
  Administered 2022-03-28: 140 mL via INTRA_ARTERIAL

## 2022-03-28 MED ORDER — HYDRALAZINE HCL 20 MG/ML IJ SOLN
INTRAMUSCULAR | Status: DC | PRN
  Administered 2022-03-28 (×2): 10 mg via INTRAVENOUS

## 2022-03-28 MED ORDER — SODIUM CHLORIDE 0.9 % IV SOLN: INTRAVENOUS | Status: DC

## 2022-03-28 MED ORDER — SODIUM CHLORIDE 0.9 % WEIGHT BASED INFUSION: 1.0000 mL/kg/h | INTRAVENOUS | Status: DC

## 2022-03-28 MED ORDER — MIDAZOLAM HCL 2 MG/2ML IJ SOLN
INTRAMUSCULAR | Status: AC
  Filled 2022-03-28: qty 2

## 2022-03-28 MED ORDER — ACETAMINOPHEN 325 MG PO TABS: 650.0000 mg | ORAL_TABLET | ORAL | Status: DC | PRN

## 2022-03-28 MED ORDER — ASPIRIN 81 MG PO CHEW
CHEWABLE_TABLET | ORAL | Status: AC
  Filled 2022-03-28: qty 1

## 2022-03-28 MED ORDER — SODIUM CHLORIDE 0.9% FLUSH: 3.0000 mL | INTRAVENOUS | Status: DC | PRN

## 2022-03-28 MED ORDER — HEPARIN (PORCINE) IN NACL 1000-0.9 UT/500ML-% IV SOLN
INTRAVENOUS | Status: AC
  Filled 2022-03-28: qty 1000

## 2022-03-28 MED ORDER — FENTANYL CITRATE (PF) 100 MCG/2ML IJ SOLN
INTRAMUSCULAR | Status: AC
  Filled 2022-03-28: qty 2

## 2022-03-28 MED ORDER — HYDRALAZINE HCL 20 MG/ML IJ SOLN
5.0000 mg | INTRAMUSCULAR | Status: DC | PRN
  Administered 2022-03-28: 5 mg via INTRAVENOUS

## 2022-03-28 MED ORDER — HYDRALAZINE HCL 20 MG/ML IJ SOLN
INTRAMUSCULAR | Status: AC
  Filled 2022-03-28: qty 1

## 2022-03-28 MED ORDER — ONDANSETRON HCL 4 MG/2ML IJ SOLN: 4.0000 mg | Freq: Four times a day (QID) | INTRAMUSCULAR | Status: DC | PRN

## 2022-03-28 MED ORDER — LIDOCAINE HCL (PF) 1 % IJ SOLN
INTRAMUSCULAR | Status: DC | PRN
  Administered 2022-03-28: 15 mL

## 2022-03-28 SURGICAL SUPPLY — 11 items
CATH ANGIO 5F PIGTAIL 65CM (CATHETERS) IMPLANT
CATH STRAIGHT 5FR 65CM (CATHETERS) IMPLANT
CATH TEMPO 5F RIM 65CM (CATHETERS) IMPLANT
KIT MICROPUNCTURE NIT STIFF (SHEATH) IMPLANT
KIT PV (KITS) ×1 IMPLANT
SHEATH PINNACLE 5F 10CM (SHEATH) IMPLANT
SHEATH PROBE COVER 6X72 (BAG) IMPLANT
SYR MEDRAD MARK 7 150ML (SYRINGE) ×1 IMPLANT
TRANSDUCER W/STOPCOCK (MISCELLANEOUS) ×1 IMPLANT
TRAY PV CATH (CUSTOM PROCEDURE TRAY) ×1 IMPLANT
WIRE HITORQ VERSACORE ST 145CM (WIRE) IMPLANT

## 2022-03-28 NOTE — Progress Notes (Signed)
RLE vein mapping has been completed.   Results can be found under chart review under CV PROC. 03/28/2022 4:04 PM Dickson Kostelnik RVT, RDMS

## 2022-03-28 NOTE — Interval H&P Note (Signed)
History and Physical Interval Note:  03/28/2022 10:29 AM  Jose Morrow  has presented today for surgery, with the diagnosis of PAD.  The various methods of treatment have been discussed with the patient and family. After consideration of risks, benefits and other options for treatment, the patient has consented to  Procedure(s): ABDOMINAL AORTOGRAM W/LOWER EXTREMITY (N/A) as a surgical intervention.  The patient's history has been reviewed, patient examined, no change in status, stable for surgery.  I have reviewed the patient's chart and labs.  Questions were answered to the patient's satisfaction.     Deitra Mayo

## 2022-03-28 NOTE — Progress Notes (Signed)
Anderson Malta rn called to update K+ 3.3, she will update Dr Scot Dock.

## 2022-03-28 NOTE — Op Note (Signed)
PATIENT: Jose Morrow      MRN: [id number] DOB: 1965/04/08    DATE OF PROCEDURE: 03/28/2022  INDICATIONS:    Jose Morrow is a 57 y.o. male who presents with critical limb ischemia of the right lower extremity.  He has a rest pain and a wound on the dorsum of his right foot.  He presents for arteriography and possible intervention.  PROCEDURE:    Conscious sedation Ultrasound-guided access to the left common femoral artery Aortogram with bilateral iliac arteriogram Selective catheterization of the right external iliac artery (second-order catheterization) with right lower extremity runoff  SURGEON: Judeth Cornfield. Scot Dock, MD, FACS  ANESTHESIA: Local with sedation  EBL: Minimal  TECHNIQUE: The patient was brought to the peripheral vascular lab and was sedated. The period of conscious sedation was 51 minutes.  During that time period, I was present face-to-face 100% of the time.  The patient was administered 1 mg of Versed and 50 mcg of fentanyl. The patient's heart rate, blood pressure, and oxygen saturation were monitored by the nurse continuously during the procedure.  Both groins were prepped and draped in the usual sterile fashion.  Under ultrasound guidance, after the skin was anesthetized, I cannulated the left common femoral artery with a micropuncture needle and a micropuncture sheath was introduced over a wire.  This was exchanged for a 5 [place] sheath over a Bentson wire.  By ultrasound the femoral artery was patent. A real-time image was obtained and sent to the server.  The pigtail catheter was positioned at the L1 vertebral body and flush aortogram obtained.  The catheter was in position above the aortic bifurcation and exchanged this for crossover catheter.  This was positioned in the proximal right common iliac artery.  The wire was advanced down to the common femoral artery and this was exchanged for a straight catheter.  Selective right external iliac arteriogram was  obtained with right lower extremity runoff.  This catheter was then removed and a retrograde left femoral arteriogram was obtained.  Of note his blood pressure was significantly elevated and I elected not to use a closure device on his left groin.  The patient was transferred to the holding area for removal of the sheath.  FINDINGS:   There are single renal arteries bilaterally with no significant renal artery stenosis identified.  The infrarenal aorta, bilateral common iliac arteries, bilateral external iliac arteries, and bilateral internal iliac arteries are patent. On the right side, which is the symptomatic side, the common femoral and deep femoral artery are patent.  The superficial femoral artery is occluded at its origin.  There is reconstitution of the below-knee popliteal artery which is patent.  There is single-vessel runoff via the peroneal artery on the right.  The anterior tibial artery is patent and disease proximally but occludes shortly after that.  The posterior tibial artery is occluded.  There is some reconstitution of the distal anterior tibial artery in the lower leg but the DP is not visualized. On the left side the common femoral, deep femoral, superficial femoral, and popliteal arteries are patent.  There is two-vessel runoff on the left via the posterior tibial and peroneal arteries.  The anterior tibial artery is occluded.    CLINICAL NOTE: Based on his arteriogram, I think his best option is a right femoral to below-knee popliteal artery bypass graft.  Will get his vein map today.  Have scheduled him for surgery on Tuesday next week.  I have reviewed the indications for  the procedure and the potential complications with the patient today.  He understands that this is a limb threatening problem and that without revascularization the wound on the foot will not heal.  Deitra Mayo, MD, FACS [facility]  DATE OF DICTATION:    03/28/2022

## 2022-03-28 NOTE — Progress Notes (Signed)
Site area- left  Site Prior to Removal- 0   Pressure Applied For-  20 MInutes   Bedrest Beginning at - 1255   Manual- Yes   Patient Status During Pull- Stable    Post Pull Groin Site- 0   Post Pull Instructions Given- Yes   Post Pull Pulses Present- Yes doppler pre and post PT   Dressing Applied- Tegaderm and Gauze Dressing    Comments:  5 Fr sheath removed from the left femoral artery by Murrell Converse RN , initially observed by Debbie,RN for the first 10 minutes of hold then observed Zach, RCIS. Patient VS, stable throughout manual pressure. No complications to report.

## 2022-03-28 NOTE — Discharge Instructions (Signed)
Femoral Site Care This sheet gives you information about how to care for yourself after your procedure. Your health care provider may also give you more specific instructions. If you have problems or questions, contact your health care provider. What can I expect after the procedure?  After the procedure, it is common to have: Bruising that usually fades within 1-2 weeks. Tenderness at the site. Follow these instructions at home: Wound care Follow instructions from your health care provider about how to take care of your insertion site. Make sure you: Wash your hands with soap and water before you change your bandage (dressing). If soap and water are not available, use hand sanitizer. Remove your dressing as told by your health care provider. In 24 hours Do not take baths, swim, or use a hot tub until your health care provider approves. You may shower 24-48 hours after the procedure or as told by your health care provider. Gently wash the site with plain soap and water. Pat the area dry with a clean towel. Do not rub the site. This may cause bleeding. Do not apply powder or lotion to the site. Keep the site clean and dry. Check your femoral site every day for signs of infection. Check for: Redness, swelling, or pain. Fluid or blood. Warmth. Pus or a bad smell. Activity For the first 2-3 days after your procedure, or as long as directed: Avoid climbing stairs as much as possible. Do not squat. Do not lift anything that is heavier than 10 lb (4.5 kg), or the limit that you are told, until your health care provider says that it is safe. For 5 days Rest as directed. Avoid sitting for a long time without moving. Get up to take short walks every 1-2 hours. Do not drive for 24 hours if you were given a medicine to help you relax (sedative). General instructions Take over-the-counter and prescription medicines only as told by your health care provider. Keep all follow-up visits as told by  your health care provider. This is important. Contact a health care provider if you have: A fever or chills. You have redness, swelling, or pain around your insertion site. Get help right away if: The catheter insertion area swells very fast. You pass out. You suddenly start to sweat or your skin gets clammy. The catheter insertion area is bleeding, and the bleeding does not stop when you hold steady pressure on the area. The area near or just beyond the catheter insertion site becomes pale, cool, tingly, or numb. These symptoms may represent a serious problem that is an emergency. Do not wait to see if the symptoms will go away. Get medical help right away. Call your local emergency services (911 in the [place] Do not drive yourself to the hospital. Summary After the procedure, it is common to have bruising that usually fades within 1-2 weeks. Check your femoral site every day for signs of infection. Do not lift anything that is heavier than 10 lb (4.5 kg), or the limit that you are told, until your health care provider says that it is safe. This information is not intended to replace advice given to you by your health care provider. Make sure you discuss any questions you have with your health care provider. Document Revised: 03/02/2017 Document Reviewed: 03/02/2017 Elsevier Patient Education  2020 [employer] 

## 2022-03-31 ENCOUNTER — Telehealth: Payer: Self-pay

## 2022-03-31 ENCOUNTER — Other Ambulatory Visit: Payer: Self-pay

## 2022-03-31 ENCOUNTER — Encounter (HOSPITAL_COMMUNITY): Payer: Self-pay | Admitting: Vascular Surgery

## 2022-03-31 NOTE — Progress Notes (Addendum)
Mr. Jose Morrow denies chest pain or shortness of breath. Patient denies having any s/s of Covid in his household, also denies any known exposure to Covid. I reviewed medication list with Mr. Jose Morrow, patient said that he was out of some medications, "I told my Dr., he did not give me any prescriptions."  I explained to patient that he should call his pharmacy and tell them what medications you need refilled, the pharmacy will reach out to DR; this is the way most offices want to manage prescription renewal. I asked Karoline Caldwell. PA-C to review chart.  Mr. Jose Morrow reported that he takes the 3 prescriptions for high blood pressure, he took them this am, in am I instructed patient to not take Dyazide or Lisinopril. I went over with patient if his blood pressure is as high as it was in Dr. Nicole Cella office, surgery will be cancelled. Mr Jose Morrow smokes 2 packs of cigarettes a day, I instructed patient to not smoke anymore before surgery,, this could lower blood pressure.  I also instructed Mr. Jose Morrow to drink a lot of water that it helps get rid of sodium.  Mr. Jose Morrow said, "I want to have surgery, my foot is hurting so bad."  Mr. Jose Morrow has type II diabetes, patient does not have blood sugar machine, last A!C was 8.8 on 02/05/22. Patient's PCP is Dr. Scarlette Calico.

## 2022-03-31 NOTE — Telephone Encounter (Signed)
Attempted to reach patient to provide instructions regarding surgery scheduled for 04/01/22, but no answer. Left message for patient to return call.

## 2022-03-31 NOTE — Telephone Encounter (Addendum)
Spoke with patient. Instructions provided for surgery on tomorrow with Dr. Scot Dock and made aware that preadmission testing will be contacting him to review medications and any other special instructions. Also informed patient of information received per Karoline Caldwell, PA-C, that if his BP is elevated like it was at his last office and ED visit, that his surgery would have to be cancelled. Stressed the importance of medication compliance. Patient verbalized understanding to all information provided.

## 2022-04-01 ENCOUNTER — Inpatient Hospital Stay (HOSPITAL_COMMUNITY): Payer: Commercial Managed Care - HMO | Admitting: Physician Assistant

## 2022-04-01 ENCOUNTER — Encounter (HOSPITAL_COMMUNITY)
Admission: RE | Disposition: A | Discharge status: Home Health | Payer: Self-pay | Source: Home / Self Care | Attending: Vascular Surgery

## 2022-04-01 ENCOUNTER — Encounter (HOSPITAL_COMMUNITY): Payer: Self-pay | Admitting: Vascular Surgery

## 2022-04-01 ENCOUNTER — Inpatient Hospital Stay (HOSPITAL_COMMUNITY)
Admission: RE | Admit: 2022-04-01 | Discharge: 2022-04-03 | DRG: 253 | Disposition: A | Discharge status: Home Health | Payer: Commercial Managed Care - HMO | Attending: Vascular Surgery | Admitting: Vascular Surgery

## 2022-04-01 ENCOUNTER — Other Ambulatory Visit: Payer: Self-pay

## 2022-04-01 DIAGNOSIS — F17210 Nicotine dependence, cigarettes, uncomplicated: Secondary | ICD-10-CM | POA: Diagnosis present

## 2022-04-01 DIAGNOSIS — I10 Essential (primary) hypertension: Secondary | ICD-10-CM | POA: Diagnosis not present

## 2022-04-01 DIAGNOSIS — S91301A Unspecified open wound, right foot, initial encounter: Secondary | ICD-10-CM | POA: Diagnosis present

## 2022-04-01 DIAGNOSIS — Z886 Allergy status to analgesic agent status: Secondary | ICD-10-CM | POA: Diagnosis not present

## 2022-04-01 DIAGNOSIS — Z794 Long term (current) use of insulin: Secondary | ICD-10-CM

## 2022-04-01 DIAGNOSIS — Z7984 Long term (current) use of oral hypoglycemic drugs: Secondary | ICD-10-CM | POA: Diagnosis not present

## 2022-04-01 DIAGNOSIS — E1151 Type 2 diabetes mellitus with diabetic peripheral angiopathy without gangrene: Secondary | ICD-10-CM | POA: Diagnosis present

## 2022-04-01 DIAGNOSIS — M62838 Other muscle spasm: Secondary | ICD-10-CM | POA: Diagnosis present

## 2022-04-01 DIAGNOSIS — Z79899 Other long term (current) drug therapy: Secondary | ICD-10-CM

## 2022-04-01 DIAGNOSIS — I70221 Atherosclerosis of native arteries of extremities with rest pain, right leg: Secondary | ICD-10-CM | POA: Diagnosis present

## 2022-04-01 DIAGNOSIS — I2510 Atherosclerotic heart disease of native coronary artery without angina pectoris: Secondary | ICD-10-CM | POA: Diagnosis present

## 2022-04-01 DIAGNOSIS — E1122 Type 2 diabetes mellitus with diabetic chronic kidney disease: Secondary | ICD-10-CM | POA: Diagnosis present

## 2022-04-01 DIAGNOSIS — I129 Hypertensive chronic kidney disease with stage 1 through stage 4 chronic kidney disease, or unspecified chronic kidney disease: Secondary | ICD-10-CM | POA: Diagnosis present

## 2022-04-01 DIAGNOSIS — N182 Chronic kidney disease, stage 2 (mild): Secondary | ICD-10-CM | POA: Diagnosis present

## 2022-04-01 DIAGNOSIS — N179 Acute kidney failure, unspecified: Secondary | ICD-10-CM | POA: Diagnosis present

## 2022-04-01 DIAGNOSIS — Z8249 Family history of ischemic heart disease and other diseases of the circulatory system: Secondary | ICD-10-CM | POA: Diagnosis not present

## 2022-04-01 DIAGNOSIS — I70223 Atherosclerosis of native arteries of extremities with rest pain, bilateral legs: Principal | ICD-10-CM | POA: Diagnosis present

## 2022-04-01 DIAGNOSIS — I70229 Atherosclerosis of native arteries of extremities with rest pain, unspecified extremity: Secondary | ICD-10-CM | POA: Diagnosis present

## 2022-04-01 DIAGNOSIS — Z880 Allergy status to penicillin: Secondary | ICD-10-CM | POA: Diagnosis not present

## 2022-04-01 DIAGNOSIS — D751 Secondary polycythemia: Secondary | ICD-10-CM | POA: Diagnosis present

## 2022-04-01 DIAGNOSIS — D509 Iron deficiency anemia, unspecified: Secondary | ICD-10-CM | POA: Diagnosis present

## 2022-04-01 DIAGNOSIS — E871 Hypo-osmolality and hyponatremia: Secondary | ICD-10-CM | POA: Diagnosis present

## 2022-04-01 DIAGNOSIS — I998 Other disorder of circulatory system: Secondary | ICD-10-CM

## 2022-04-01 HISTORY — PX: THROMBECTOMY FEMORAL ARTERY: SHX6406

## 2022-04-01 HISTORY — PX: FEMORAL-POPLITEAL BYPASS GRAFT: SHX937

## 2022-04-01 HISTORY — PX: APPLICATION OF WOUND VAC: SHX5189

## 2022-04-01 HISTORY — PX: ENDARTERECTOMY FEMORAL: SHX5804

## 2022-04-01 HISTORY — DX: Peripheral vascular disease, unspecified: I73.9

## 2022-04-01 LAB — COMPREHENSIVE METABOLIC PANEL
ALT: 8 U/L (ref 0–44)
AST: 26 U/L (ref 15–41)
Albumin: 3.5 g/dL (ref 3.5–5.0)
Alkaline Phosphatase: 58 U/L (ref 38–126)
Anion gap: 12 (ref 5–15)
BUN: 17 mg/dL (ref 6–20)
CO2: 24 mmol/L (ref 22–32)
Calcium: 9.5 mg/dL (ref 8.9–10.3)
Chloride: 98 mmol/L (ref 98–111)
Creatinine, Ser: 1.24 mg/dL (ref 0.61–1.24)
GFR, Estimated: >60 mL/min (ref >60)
Glucose, Bld: 179 mg/dL — ABNORMAL HIGH (ref 70–99)
Potassium: 3.8 mmol/L (ref 3.5–5.1)
Sodium: 134 mmol/L — ABNORMAL LOW (ref 135–145)
Total Bilirubin: 2.2 mg/dL — ABNORMAL HIGH (ref 0.3–1.2)
Total Protein: 7 g/dL (ref 6.5–8.1)

## 2022-04-01 LAB — GLUCOSE, CAPILLARY
Glucose-Capillary: 205 mg/dL — ABNORMAL HIGH (ref 70–99)
Glucose-Capillary: 184 mg/dL — ABNORMAL HIGH (ref 70–99)
Glucose-Capillary: 39 mg/dL — CL (ref 70–99)
Glucose-Capillary: 84 mg/dL (ref 70–99)
Glucose-Capillary: 86 mg/dL (ref 70–99)
Glucose-Capillary: 89 mg/dL (ref 70–99)
Glucose-Capillary: 114 mg/dL — ABNORMAL HIGH (ref 70–99)
Glucose-Capillary: 129 mg/dL — ABNORMAL HIGH (ref 70–99)
Glucose-Capillary: 351 mg/dL — ABNORMAL HIGH (ref 70–99)

## 2022-04-01 LAB — CBC
HCT: 62.1 % — ABNORMAL HIGH (ref 39.0–52.0)
HCT: 52.2 % — ABNORMAL HIGH (ref 39.0–52.0)
Hemoglobin: 20.1 g/dL — ABNORMAL HIGH (ref 13.0–17.0)
Hemoglobin: 17.7 g/dL — ABNORMAL HIGH (ref 13.0–17.0)
MCH: 26.7 pg (ref 26.0–34.0)
MCH: 27.3 pg (ref 26.0–34.0)
MCHC: 32.4 g/dL (ref 30.0–36.0)
MCHC: 33.9 g/dL (ref 30.0–36.0)
MCV: 82.6 fL (ref 80.0–100.0)
MCV: 80.6 fL (ref 80.0–100.0)
Platelets: 208 10*3/uL (ref 150–400)
Platelets: 201 10*3/uL (ref 150–400)
RBC: 7.52 MIL/uL — ABNORMAL HIGH (ref 4.22–5.81)
RBC: 6.48 MIL/uL — ABNORMAL HIGH (ref 4.22–5.81)
RDW: 18.3 % — ABNORMAL HIGH (ref 11.5–15.5)
RDW: 17.2 % — ABNORMAL HIGH (ref 11.5–15.5)
WBC: 5.4 10*3/uL (ref 4.0–10.5)
WBC: 8.2 10*3/uL (ref 4.0–10.5)
nRBC: 0 % (ref 0.0–0.2)
nRBC: 0 % (ref 0.0–0.2)

## 2022-04-01 LAB — URINALYSIS, ROUTINE W REFLEX MICROSCOPIC
Bilirubin Urine: NEGATIVE
Glucose, UA: NEGATIVE mg/dL
Ketones, ur: NEGATIVE mg/dL
Leukocytes,Ua: NEGATIVE
Nitrite: NEGATIVE
Protein, ur: 100 mg/dL — AB
Specific Gravity, Urine: 1.016 (ref 1.005–1.030)
pH: 5 (ref 5.0–8.0)

## 2022-04-01 LAB — POCT ACTIVATED CLOTTING TIME
Activated Clotting Time: 347 seconds
Activated Clotting Time: 287 seconds
Activated Clotting Time: 136 seconds

## 2022-04-01 LAB — PROTIME-INR
INR: 1.1 (ref 0.8–1.2)
Prothrombin Time: 14.2 seconds (ref 11.4–15.2)

## 2022-04-01 LAB — TYPE AND SCREEN
ABO/RH(D): B POS
Antibody Screen: NEGATIVE

## 2022-04-01 LAB — RETICULOCYTES
Immature Retic Fract: 22.7 % — ABNORMAL HIGH (ref 2.3–15.9)
RBC.: 6.44 MIL/uL — ABNORMAL HIGH (ref 4.22–5.81)
Retic Count, Absolute: 102.4 10*3/uL (ref 19.0–186.0)
Retic Ct Pct: 1.6 % (ref 0.4–3.1)

## 2022-04-01 LAB — IRON AND TIBC
Iron: 271 ug/dL — ABNORMAL HIGH (ref 45–182)
Saturation Ratios: 74 % — ABNORMAL HIGH (ref 17.9–39.5)
TIBC: 368 ug/dL (ref 250–450)
UIBC: 97 ug/dL

## 2022-04-01 LAB — SURGICAL PCR SCREEN
MRSA, PCR: NEGATIVE
Staphylococcus aureus: NEGATIVE

## 2022-04-01 LAB — FERRITIN: Ferritin: 20 ng/mL — ABNORMAL LOW (ref 24–336)

## 2022-04-01 LAB — APTT: aPTT: 28 seconds (ref 24–36)

## 2022-04-01 SURGERY — BYPASS GRAFT FEMORAL-POPLITEAL ARTERY
Anesthesia: General | Site: Leg Lower | Laterality: Right

## 2022-04-01 MED ORDER — LISINOPRIL 5 MG PO TABS
5.0000 mg | ORAL_TABLET | Freq: Every day | ORAL | Status: DC
  Administered 2022-04-01 – 2022-04-03 (×3): 5 mg via ORAL
  Filled 2022-04-01 (×4): qty 1

## 2022-04-01 MED ORDER — METOPROLOL TARTRATE 5 MG/5ML IV SOLN: 2.0000 mg | INTRAVENOUS | Status: DC | PRN

## 2022-04-01 MED ORDER — ORAL CARE MOUTH RINSE: 15.0000 mL | OROMUCOSAL | Status: DC | PRN

## 2022-04-01 MED ORDER — OXYCODONE HCL 5 MG PO TABS
5.0000 mg | ORAL_TABLET | ORAL | Status: DC | PRN
  Administered 2022-04-01 – 2022-04-02 (×3): 10 mg via ORAL
  Filled 2022-04-01 (×3): qty 2

## 2022-04-01 MED ORDER — AMLODIPINE BESYLATE 10 MG PO TABS
10.0000 mg | ORAL_TABLET | Freq: Every day | ORAL | Status: DC
  Administered 2022-04-02 – 2022-04-03 (×2): 10 mg via ORAL
  Filled 2022-04-01 (×2): qty 1

## 2022-04-01 MED ORDER — FENTANYL CITRATE (PF) 250 MCG/5ML IJ SOLN
INTRAMUSCULAR | Status: AC
  Filled 2022-04-01: qty 5

## 2022-04-01 MED ORDER — SODIUM CHLORIDE 0.9 % IV SOLN: INTRAVENOUS | Status: DC

## 2022-04-01 MED ORDER — VANCOMYCIN HCL IN DEXTROSE 1-5 GM/200ML-% IV SOLN
1000.0000 mg | Freq: Two times a day (BID) | INTRAVENOUS | Status: AC
  Administered 2022-04-01 – 2022-04-02 (×2): 1000 mg via INTRAVENOUS
  Filled 2022-04-01 (×2): qty 200

## 2022-04-01 MED ORDER — EPHEDRINE SULFATE-NACL 50-0.9 MG/10ML-% IV SOSY
PREFILLED_SYRINGE | INTRAVENOUS | Status: DC | PRN
  Administered 2022-04-01 (×2): 2.5 mg via INTRAVENOUS

## 2022-04-01 MED ORDER — VANCOMYCIN HCL IN DEXTROSE 1-5 GM/200ML-% IV SOLN
1000.0000 mg | INTRAVENOUS | Status: AC
  Administered 2022-04-01: 1000 mg via INTRAVENOUS
  Filled 2022-04-01: qty 200

## 2022-04-01 MED ORDER — CHLORHEXIDINE GLUCONATE CLOTH 2 % EX PADS: 6.0000 | MEDICATED_PAD | Freq: Once | CUTANEOUS | Status: DC

## 2022-04-01 MED ORDER — ONDANSETRON HCL 4 MG/2ML IJ SOLN: 4.0000 mg | Freq: Four times a day (QID) | INTRAMUSCULAR | Status: DC | PRN

## 2022-04-01 MED ORDER — MAGNESIUM SULFATE 2 GM/50ML IV SOLN: 2.0000 g | Freq: Every day | INTRAVENOUS | Status: DC | PRN

## 2022-04-01 MED ORDER — ACETAMINOPHEN 650 MG RE SUPP: 325.0000 mg | RECTAL | Status: DC | PRN

## 2022-04-01 MED ORDER — CHLORHEXIDINE GLUCONATE 0.12 % MT SOLN
OROMUCOSAL | Status: AC
  Administered 2022-04-01: 15 mL via OROMUCOSAL
  Filled 2022-04-01: qty 15

## 2022-04-01 MED ORDER — ESMOLOL HCL 100 MG/10ML IV SOLN
INTRAVENOUS | Status: DC | PRN
  Administered 2022-04-01: 20 mg via INTRAVENOUS

## 2022-04-01 MED ORDER — INSULIN DETEMIR 100 UNIT/ML ~~LOC~~ SOLN
50.0000 [IU] | Freq: Every day | SUBCUTANEOUS | Status: DC
  Administered 2022-04-02 – 2022-04-03 (×2): 50 [IU] via SUBCUTANEOUS
  Filled 2022-04-01 (×2): qty 0.5

## 2022-04-01 MED ORDER — FENTANYL CITRATE (PF) 250 MCG/5ML IJ SOLN
INTRAMUSCULAR | Status: DC | PRN
  Administered 2022-04-01: 100 ug via INTRAVENOUS
  Administered 2022-04-01: 50 ug via INTRAVENOUS
  Administered 2022-04-01: 150 ug via INTRAVENOUS

## 2022-04-01 MED ORDER — PHENOL 1.4 % MT LIQD: 1.0000 | OROMUCOSAL | Status: DC | PRN

## 2022-04-01 MED ORDER — POTASSIUM CHLORIDE CRYS ER 20 MEQ PO TBCR: 20.0000 meq | EXTENDED_RELEASE_TABLET | Freq: Every day | ORAL | Status: DC | PRN

## 2022-04-01 MED ORDER — MORPHINE SULFATE (PF) 2 MG/ML IV SOLN: 2.0000 mg | INTRAVENOUS | Status: DC | PRN

## 2022-04-01 MED ORDER — HEPARIN 6000 UNIT IRRIGATION SOLUTION
Status: AC
  Filled 2022-04-01: qty 500

## 2022-04-01 MED ORDER — DEXAMETHASONE SODIUM PHOSPHATE 10 MG/ML IJ SOLN
INTRAMUSCULAR | Status: DC | PRN
  Administered 2022-04-01: 5 mg via INTRAVENOUS

## 2022-04-01 MED ORDER — INSULIN ASPART 100 UNIT/ML IJ SOLN
0.0000 [IU] | INTRAMUSCULAR | Status: DC | PRN
  Administered 2022-04-01: 4 [IU] via SUBCUTANEOUS
  Filled 2022-04-01: qty 1

## 2022-04-01 MED ORDER — PROTAMINE SULFATE 10 MG/ML IV SOLN
INTRAVENOUS | Status: DC | PRN
  Administered 2022-04-01: 30 mg via INTRAVENOUS
  Administered 2022-04-01: 20 mg via INTRAVENOUS

## 2022-04-01 MED ORDER — ACETAMINOPHEN 325 MG PO TABS: 325.0000 mg | ORAL_TABLET | ORAL | Status: DC | PRN

## 2022-04-01 MED ORDER — LACTATED RINGERS IV SOLN: INTRAVENOUS | Status: DC

## 2022-04-01 MED ORDER — HEPARIN 6000 UNIT IRRIGATION SOLUTION
Status: DC | PRN
  Administered 2022-04-01: 1

## 2022-04-01 MED ORDER — PHENYLEPHRINE HCL-NACL 20-0.9 MG/250ML-% IV SOLN
INTRAVENOUS | Status: DC | PRN
  Administered 2022-04-01: 20 ug/min via INTRAVENOUS

## 2022-04-01 MED ORDER — DEXAMETHASONE SODIUM PHOSPHATE 10 MG/ML IJ SOLN: INTRAMUSCULAR | Status: DC | PRN

## 2022-04-01 MED ORDER — BISACODYL 10 MG RE SUPP: 10.0000 mg | Freq: Every day | RECTAL | Status: DC | PRN

## 2022-04-01 MED ORDER — HEMOSTATIC AGENTS (NO CHARGE) OPTIME
TOPICAL | Status: DC | PRN
  Administered 2022-04-01: 1 via TOPICAL

## 2022-04-01 MED ORDER — ROCURONIUM BROMIDE 10 MG/ML (PF) SYRINGE
PREFILLED_SYRINGE | INTRAVENOUS | Status: DC | PRN
  Administered 2022-04-01: 50 mg via INTRAVENOUS
  Administered 2022-04-01: 30 mg via INTRAVENOUS
  Administered 2022-04-01: 50 mg via INTRAVENOUS
  Administered 2022-04-01: 60 mg via INTRAVENOUS

## 2022-04-01 MED ORDER — MIDAZOLAM HCL 2 MG/2ML IJ SOLN
INTRAMUSCULAR | Status: DC | PRN
  Administered 2022-04-01: 2 mg via INTRAVENOUS

## 2022-04-01 MED ORDER — ALBUMIN HUMAN 5 % IV SOLN: INTRAVENOUS | Status: DC | PRN

## 2022-04-01 MED ORDER — 0.9 % SODIUM CHLORIDE (POUR BTL) OPTIME
TOPICAL | Status: DC | PRN
  Administered 2022-04-01 (×2): 1000 mL

## 2022-04-01 MED ORDER — TRIAMTERENE-HCTZ 37.5-25 MG PO CAPS
1.0000 | ORAL_CAPSULE | Freq: Every day | ORAL | Status: DC
  Filled 2022-04-01: qty 1

## 2022-04-01 MED ORDER — PAPAVERINE HCL 30 MG/ML IJ SOLN
INTRAMUSCULAR | Status: DC | PRN
  Administered 2022-04-01: 60 mg

## 2022-04-01 MED ORDER — LIDOCAINE 2% (20 MG/ML) 5 ML SYRINGE
INTRAMUSCULAR | Status: DC | PRN
  Administered 2022-04-01: 60 mg via INTRAVENOUS
  Administered 2022-04-01: 20 mg via INTRAVENOUS

## 2022-04-01 MED ORDER — HEPARIN SODIUM (PORCINE) 1000 UNIT/ML IJ SOLN
INTRAMUSCULAR | Status: DC | PRN
  Administered 2022-04-01: 8000 [IU] via INTRAVENOUS

## 2022-04-01 MED ORDER — LACTATED RINGERS IV SOLN: INTRAVENOUS | Status: DC | PRN

## 2022-04-01 MED ORDER — FENTANYL CITRATE (PF) 100 MCG/2ML IJ SOLN: 25.0000 ug | INTRAMUSCULAR | Status: DC | PRN

## 2022-04-01 MED ORDER — ATORVASTATIN CALCIUM 10 MG PO TABS
20.0000 mg | ORAL_TABLET | Freq: Every day | ORAL | Status: DC
  Administered 2022-04-01 – 2022-04-03 (×3): 20 mg via ORAL
  Filled 2022-04-01 (×3): qty 2

## 2022-04-01 MED ORDER — OXYCODONE HCL 5 MG/5ML PO SOLN: 5.0000 mg | Freq: Once | ORAL | Status: DC | PRN

## 2022-04-01 MED ORDER — ONDANSETRON HCL 4 MG/2ML IJ SOLN
INTRAMUSCULAR | Status: DC | PRN
  Administered 2022-04-01: 4 mg via INTRAVENOUS

## 2022-04-01 MED ORDER — LABETALOL HCL 5 MG/ML IV SOLN: 10.0000 mg | INTRAVENOUS | Status: DC | PRN

## 2022-04-01 MED ORDER — POLYETHYLENE GLYCOL 3350 17 G PO PACK: 17.0000 g | PACK | Freq: Every day | ORAL | Status: DC | PRN

## 2022-04-01 MED ORDER — GUAIFENESIN-DM 100-10 MG/5ML PO SYRP: 15.0000 mL | ORAL_SOLUTION | ORAL | Status: DC | PRN

## 2022-04-01 MED ORDER — PROPOFOL 10 MG/ML IV BOLUS
INTRAVENOUS | Status: AC
  Filled 2022-04-01: qty 20

## 2022-04-01 MED ORDER — SODIUM CHLORIDE 0.9 % IV SOLN: 500.0000 mL | Freq: Once | INTRAVENOUS | Status: DC | PRN

## 2022-04-01 MED ORDER — TIZANIDINE HCL 4 MG PO TABS: 4.0000 mg | ORAL_TABLET | Freq: Four times a day (QID) | ORAL | Status: DC | PRN

## 2022-04-01 MED ORDER — MIDAZOLAM HCL 2 MG/2ML IJ SOLN
INTRAMUSCULAR | Status: AC
  Filled 2022-04-01: qty 2

## 2022-04-01 MED ORDER — SUGAMMADEX SODIUM 200 MG/2ML IV SOLN
INTRAVENOUS | Status: DC | PRN
  Administered 2022-04-01 (×2): 50 mg via INTRAVENOUS
  Administered 2022-04-01: 100 mg via INTRAVENOUS

## 2022-04-01 MED ORDER — PAPAVERINE HCL 30 MG/ML IJ SOLN
INTRAMUSCULAR | Status: AC
  Filled 2022-04-01: qty 2

## 2022-04-01 MED ORDER — CHLORHEXIDINE GLUCONATE 0.12 % MT SOLN: 15.0000 mL | Freq: Once | OROMUCOSAL | Status: AC

## 2022-04-01 MED ORDER — NICOTINE 21 MG/24HR TD PT24
21.0000 mg | MEDICATED_PATCH | Freq: Every day | TRANSDERMAL | Status: DC
  Administered 2022-04-01 – 2022-04-03 (×3): 21 mg via TRANSDERMAL
  Filled 2022-04-01 (×3): qty 1

## 2022-04-01 MED ORDER — ALUM & MAG HYDROXIDE-SIMETH 200-200-20 MG/5ML PO SUSP: 15.0000 mL | ORAL | Status: DC | PRN

## 2022-04-01 MED ORDER — PHENYLEPHRINE 80 MCG/ML (10ML) SYRINGE FOR IV PUSH (FOR BLOOD PRESSURE SUPPORT)
PREFILLED_SYRINGE | INTRAVENOUS | Status: DC | PRN
  Administered 2022-04-01 (×7): 80 ug via INTRAVENOUS

## 2022-04-01 MED ORDER — ORAL CARE MOUTH RINSE: 15.0000 mL | Freq: Once | OROMUCOSAL | Status: AC

## 2022-04-01 MED ORDER — INSULIN ASPART 100 UNIT/ML IJ SOLN
0.0000 [IU] | Freq: Three times a day (TID) | INTRAMUSCULAR | Status: DC
  Administered 2022-04-01: 2 [IU] via SUBCUTANEOUS
  Administered 2022-04-02 (×2): 5 [IU] via SUBCUTANEOUS
  Administered 2022-04-02: 8 [IU] via SUBCUTANEOUS

## 2022-04-01 MED ORDER — PROPOFOL 10 MG/ML IV BOLUS
INTRAVENOUS | Status: DC | PRN
  Administered 2022-04-01: 200 mg via INTRAVENOUS

## 2022-04-01 MED ORDER — PANTOPRAZOLE SODIUM 40 MG PO TBEC
40.0000 mg | DELAYED_RELEASE_TABLET | Freq: Two times a day (BID) | ORAL | Status: DC
  Administered 2022-04-01 – 2022-04-03 (×4): 40 mg via ORAL
  Filled 2022-04-01 (×4): qty 1

## 2022-04-01 MED ORDER — HEPARIN (PORCINE) 25000 UT/250ML-% IV SOLN
400.0000 [IU]/h | INTRAVENOUS | Status: DC
  Administered 2022-04-01: 400 [IU]/h via INTRAVENOUS
  Filled 2022-04-01: qty 250

## 2022-04-01 MED ORDER — HYDRALAZINE HCL 20 MG/ML IJ SOLN: 5.0000 mg | INTRAMUSCULAR | Status: DC | PRN

## 2022-04-01 MED ORDER — DOCUSATE SODIUM 100 MG PO CAPS
100.0000 mg | ORAL_CAPSULE | Freq: Every day | ORAL | Status: DC
  Administered 2022-04-02 – 2022-04-03 (×2): 100 mg via ORAL
  Filled 2022-04-01 (×2): qty 1

## 2022-04-01 MED ORDER — METOPROLOL TARTRATE 5 MG/5ML IV SOLN
INTRAVENOUS | Status: DC | PRN
  Administered 2022-04-01: .5 mg via INTRAVENOUS

## 2022-04-01 MED ORDER — OXYCODONE HCL 5 MG PO TABS: 5.0000 mg | ORAL_TABLET | Freq: Once | ORAL | Status: DC | PRN

## 2022-04-01 SURGICAL SUPPLY — 63 items
ADH SKN CLS APL DERMABOND .7 (GAUZE/BANDAGES/DRESSINGS) ×2
AGENT HMST 10 BLLW SHRT CANN (HEMOSTASIS) ×2
BAG COUNTER SPONGE SURGICOUNT (BAG) ×2 IMPLANT
BAG SPNG CNTER NS LX DISP (BAG) ×2
BANDAGE ESMARK 6X9 LF (GAUZE/BANDAGES/DRESSINGS) IMPLANT
BNDG CMPR 9X6 STRL LF SNTH (GAUZE/BANDAGES/DRESSINGS) ×2
BNDG ELASTIC 4X5.8 VLCR STR LF (GAUZE/BANDAGES/DRESSINGS) IMPLANT
BNDG ESMARK 6X9 LF (GAUZE/BANDAGES/DRESSINGS) ×2
CANISTER SUCT 3000ML PPV (MISCELLANEOUS) ×2 IMPLANT
CANNULA VESSEL 3MM 2 BLNT TIP (CANNULA) ×4 IMPLANT
CATH EMB 3FR 40CM (CATHETERS) IMPLANT
CLIP VESOCCLUDE MED 24/CT (CLIP) ×2 IMPLANT
CLIP VESOCCLUDE SM WIDE 24/CT (CLIP) ×2 IMPLANT
COVER PROBE W GEL 5X96 (DRAPES) IMPLANT
CUFF TOURN SGL QUICK 24 (TOURNIQUET CUFF) ×2
CUFF TOURN SGL QUICK 34 (TOURNIQUET CUFF)
CUFF TOURN SGL QUICK 42 (TOURNIQUET CUFF) IMPLANT
CUFF TRNQT CYL 24X4X16.5-23 (TOURNIQUET CUFF) IMPLANT
CUFF TRNQT CYL 34X4.125X (TOURNIQUET CUFF) IMPLANT
DERMABOND ADVANCED .7 DNX12 (GAUZE/BANDAGES/DRESSINGS) ×2 IMPLANT
DRAIN CHANNEL 15F RND FF W/TCR (WOUND CARE) IMPLANT
DRAPE HALF SHEET 40X57 (DRAPES) IMPLANT
DRAPE X-RAY CASS 24X20 (DRAPES) IMPLANT
DRESSING PEEL AND PLC PRVNA 13 (GAUZE/BANDAGES/DRESSINGS) IMPLANT
DRSG PEEL AND PLACE PREVENA 13 (GAUZE/BANDAGES/DRESSINGS) ×2
ELECT REM PT RETURN 9FT ADLT (ELECTROSURGICAL) ×2
ELECTRODE REM PT RTRN 9FT ADLT (ELECTROSURGICAL) ×2 IMPLANT
EVACUATOR SILICONE 100CC (DRAIN) IMPLANT
GLOVE BIO SURGEON STRL SZ7.5 (GLOVE) ×2 IMPLANT
GLOVE BIOGEL PI IND STRL 8 (GLOVE) ×2 IMPLANT
GLOVE SURG POLY ORTHO LF SZ7.5 (GLOVE) IMPLANT
GLOVE SURG UNDER LTX SZ8 (GLOVE) ×2 IMPLANT
GOWN STRL REUS W/ TWL LRG LVL3 (GOWN DISPOSABLE) ×6 IMPLANT
GOWN STRL REUS W/TWL LRG LVL3 (GOWN DISPOSABLE) ×6
GRAFT PROPATEN W/RING 6X80X60 (Vascular Products) IMPLANT
HEMOSTAT HEMOBLAST BELLOWS (HEMOSTASIS) IMPLANT
KIT BASIN OR (CUSTOM PROCEDURE TRAY) ×2 IMPLANT
KIT DRSG PREVENA PLUS 7DAY 125 (MISCELLANEOUS) IMPLANT
KIT TURNOVER KIT B (KITS) ×2 IMPLANT
MARKER GRAFT CORONARY BYPASS (MISCELLANEOUS) IMPLANT
NS IRRIG 1000ML POUR BTL (IV SOLUTION) ×4 IMPLANT
PACK PERIPHERAL VASCULAR (CUSTOM PROCEDURE TRAY) ×2 IMPLANT
PAD ARMBOARD 7.5X6 YLW CONV (MISCELLANEOUS) ×4 IMPLANT
SET COLLECT BLD 21X3/4 12 (NEEDLE) IMPLANT
SPONGE SURGIFOAM ABS GEL 100 (HEMOSTASIS) IMPLANT
STOPCOCK 4 WAY LG BORE MALE ST (IV SETS) IMPLANT
SUT ETHILON 3 0 PS 1 (SUTURE) IMPLANT
SUT MNCRL AB 4-0 PS2 18 (SUTURE) ×4 IMPLANT
SUT PROLENE 5 0 C 1 24 (SUTURE) ×2 IMPLANT
SUT PROLENE 6 0 BV (SUTURE) ×2 IMPLANT
SUT SILK 2 0 SH (SUTURE) ×2 IMPLANT
SUT SILK 3 0 (SUTURE)
SUT SILK 3-0 18XBRD TIE 12 (SUTURE) IMPLANT
SUT VIC AB 2-0 CTB1 (SUTURE) ×4 IMPLANT
SUT VIC AB 3-0 SH 27 (SUTURE) ×4
SUT VIC AB 3-0 SH 27X BRD (SUTURE) ×4 IMPLANT
SYR 30ML LL (SYRINGE) IMPLANT
SYR 3ML LL SCALE MARK (SYRINGE) IMPLANT
TOWEL GREEN STERILE (TOWEL DISPOSABLE) ×2 IMPLANT
TRAY FOLEY MTR SLVR 16FR STAT (SET/KITS/TRAYS/PACK) ×2 IMPLANT
TUBING EXTENTION W/L.L. (IV SETS) IMPLANT
UNDERPAD 30X36 HEAVY ABSORB (UNDERPADS AND DIAPERS) ×2 IMPLANT
WATER STERILE IRR 1000ML POUR (IV SOLUTION) ×2 IMPLANT

## 2022-04-01 NOTE — Progress Notes (Signed)
Notified Dr. Marin Comment of pt's blood pressure. Stated to continue to monitor. Informed him of CBG: 205, pt. Took levimer 25 units this am at 0730. Stated to continue with hyperglycemia protocol.

## 2022-04-01 NOTE — H&P (Signed)
History and Physical    Jose Morrow WUJ:[id number] DOB: 06-18-1965 DOA: 04/01/2022  PCP: Janith Lima, MD (Confirm with patient/family/NH records and if not entered, this has to be entered at [facility] point of entry) Patient coming from: Home  I have personally briefly reviewed patient's old medical records in [place]  Chief Complaint: Feeling ok  HPI: Jose Morrow is a 57 y.o. male with medical history significant of PVD with chronic claudication, HTN, IDDM, HLD, chronic iron deficiency anemia, cigarette smoke, came with severe resting pain of right foot was found to have acute infrainguinal arterial occlusion, and today patient underwent right femoral enterectomy, right anterior tibial thrombectomy and right common femoral artery to tibial peroneal trunk bypass.  Right now, patient denies any chest pain, and surgical wound pain is fairly controlled.  Patient has history of IDDM controlled with insulin, on blood pressure and cholesterol medications.  He has a 1 pack/day smoking for more than 30 years, denies any history of COPD.  No history of OSA.  Denies any chest pain shortness of breath associated with exertions. Afebrile, blood pressure controlled.  Sodium 134, glucose 179 CBC showed WBC 5.4, hemoglobin 20 compared to 19.3 about 4 months ago and 11.9 1-year ago.  Review of Systems: As per HPI otherwise 14 point review of systems negative.    Past Medical History:  Diagnosis Date   Diabetes mellitus    Hypertension    Peripheral vascular disease ([place])     Past Surgical History:  Procedure Laterality Date   ABDOMINAL AORTOGRAM W/LOWER EXTREMITY N/A 03/28/2022   Procedure: ABDOMINAL AORTOGRAM W/LOWER EXTREMITY;  Surgeon: Angelia Mould, MD;  Location: [facility];  Service: Cardiovascular;  Laterality: N/A;   BIOPSY  06/28/2020   Procedure: BIOPSY;  Surgeon: Irene Shipper, MD;  Location: [facility] ENDOSCOPY;  Service: Endoscopy;;   ESOPHAGOGASTRODUODENOSCOPY (EGD)  WITH PROPOFOL N/A 06/28/2020   Procedure: ESOPHAGOGASTRODUODENOSCOPY (EGD) WITH PROPOFOL;  Surgeon: Irene Shipper, MD;  Location: [facility];  Service: Endoscopy;  Laterality: N/A;     reports that he has been smoking cigarettes. He has a 60.00 pack-year smoking history. He has never used smokeless tobacco. He reports current alcohol use. He reports that he does not use drugs.  Allergies  Allergen Reactions   Penicillins Anaphylaxis   Ibuprofen Other (See Comments)    Pt states caused gastric ulcer    History reviewed. No pertinent family history.   Prior to Admission medications   Medication Sig Start Date End Date Taking? Authorizing Provider  amLODipine (NORVASC) 10 MG tablet Take 1 tablet (10 mg total) by mouth daily. 02/06/22 02/06/23 Yes Caryl Ada K, PA-C  insulin detemir (LEVEMIR) 100 UNIT/ML FlexPen Inject 50 Units into the skin daily. 02/06/22 05/08/22 Yes Janith Lima, MD  lisinopril (ZESTRIL) 5 MG tablet Take 1 tablet (5 mg total) by mouth daily. 02/06/22 02/06/23 Yes Fransico Meadow, PA-C  triamterene-hydrochlorothiazide (DYAZIDE) 37.5-25 MG capsule Take 1 each (1 capsule total) by mouth daily. 02/06/22  Yes Caryl Ada K, PA-C  Accu-Chek Softclix Lancets lancets Use as directed. 06/29/20   Antonieta Pert, MD  atorvastatin (LIPITOR) 20 MG tablet Take 1 tablet (20 mg total) by mouth daily. Patient not taking: Reported on 03/31/2022 11/10/21   Lennice Sites, DO  Blood Glucose Monitoring Suppl (ACCU-CHEK GUIDE) w/Device KIT Use as directed 11/10/21   Lennice Sites, DO  Continuous Blood Gluc Receiver (FREESTYLE LIBRE 2 READER) DEVI 1 Act by Does not apply route  daily. 02/06/22   Etta Grandchild, MD  Continuous Blood Gluc Sensor (FREESTYLE LIBRE 2 SENSOR) MISC 1 Act by Does not apply route daily. 02/06/22   Etta Grandchild, MD  dapagliflozin propanediol (FARXIGA) 10 MG TABS tablet Take 1 tablet (10 mg total) by mouth daily before breakfast. Patient not taking: Reported on 03/31/2022  02/06/22   Etta Grandchild, MD  Glucagon (GVOKE HYPOPEN 2-PACK) 1 MG/0.2ML SOAJ Inject 1 Act into the skin daily as needed. Patient not taking: Reported on 03/31/2022 10/29/20   Etta Grandchild, MD  glucose blood (ACCU-CHEK GUIDE) test strip Use as instructed 11/10/21   Curatolo, Adam, DO  insulin aspart (NOVOLOG FLEXPEN) 100 UNIT/ML FlexPen Inject 15 Units into the skin 3 (three) times daily with meals. Patient not taking: Reported on 03/31/2022 11/12/20   Etta Grandchild, MD  Insulin Pen Needle 32G X 4 MM MISC 1 each by Does not apply route 4 (four) times daily -  with meals and at bedtime. 11/10/21   Curatolo, Adam, DO  Iron, Ferrous Sulfate, 325 (65 Fe) MG TABS Take 325 mg by mouth 2 (two) times daily. Patient not taking: Reported on 03/31/2022 11/12/20   Etta Grandchild, MD  metFORMIN (GLUCOPHAGE XR) 750 MG 24 hr tablet Take 2 tablets (1,500 mg total) by mouth daily with breakfast. Patient not taking: Reported on 03/31/2022 02/06/22   Etta Grandchild, MD  pantoprazole (PROTONIX) 40 MG tablet Take 1 tablet (40 mg total) by mouth 2 (two) times daily before a meal. Patient not taking: Reported on 02/05/2022 11/10/21 01/09/22  Virgina Norfolk, DO  tiZANidine (ZANAFLEX) 4 MG tablet Take 1 tablet (4 mg total) by mouth every 6 (six) hours as needed for muscle spasms. Patient not taking: Reported on 03/31/2022 01/10/22   Jannifer Franklin, MD  hydrochlorothiazide (HYDRODIURIL) 25 MG tablet Take 1 tablet (25 mg total) by mouth daily. 11/23/19 05/01/20  Mare Ferrari, PA-C    Physical Exam: Vitals:   04/01/22 1515 04/01/22 1530 04/01/22 1545 04/01/22 1600  BP: 108/79 111/78 105/76 109/78  Pulse: 74 68 70 77  Resp: 11 19 20 17   Temp:   98.6 F (37 C) 97.8 F (36.6 C)  TempSrc:    Oral  SpO2: 96% 95% 95% 100%  Weight:      Height:        Constitutional: NAD, calm, comfortable Vitals:   04/01/22 1515 04/01/22 1530 04/01/22 1545 04/01/22 1600  BP: 108/79 111/78 105/76 109/78  Pulse: 74 68 70 77  Resp: 11 19  20 17   Temp:   98.6 F (37 C) 97.8 F (36.6 C)  TempSrc:    Oral  SpO2: 96% 95% 95% 100%  Weight:      Height:       Eyes: PERRL, lids and conjunctivae normal ENMT: Mucous membranes are moist. Posterior pharynx clear of any exudate or lesions.Normal dentition.  Neck: normal, supple, no masses, no thyromegaly Respiratory: clear to auscultation bilaterally, no wheezing, no crackles. Normal respiratory effort. No accessory muscle use.  Cardiovascular: Regular rate and rhythm, no murmurs / rubs / gallops. No extremity edema. 2+ pedal pulses. No carotid bruits.  Abdomen: no tenderness, no masses palpated. No hepatosplenomegaly. Bowel sounds positive.  Musculoskeletal: no clubbing / cyanosis. No joint deformity upper and lower extremities. Good ROM, no contractures. Normal muscle tone.  Skin: no rashes, lesions, ulcers. No induration.  Surgical site in surgical dressing, and wound VAC. Neurologic: CN 2-12 grossly intact. Sensation intact, DTR normal.  Strength 5/5 in all 4.  Psychiatric: Normal judgment and insight. Alert and oriented x 3. Normal mood.     Labs on Admission: I have personally reviewed following labs and imaging studies  CBC: Recent Labs  Lab 03/28/22 1001 04/01/22 0931  WBC  --  5.4  HGB 20.7* 20.1*  HCT 61.0* 62.1*  MCV  --  82.6  PLT  --  825   Basic Metabolic Panel: Recent Labs  Lab 03/28/22 1001 04/01/22 0931  NA 138 134*  K 3.3* 3.8  CL 98 98  CO2  --  24  GLUCOSE 169* 179*  BUN 15 17  CREATININE 1.20 1.24  CALCIUM  --  9.5   GFR: Estimated Creatinine Clearance: 60 mL/min (by C-G formula based on SCr of 1.24 mg/dL). Liver Function Tests: Recent Labs  Lab 04/01/22 0931  AST 26  ALT 8  ALKPHOS 58  BILITOT 2.2*  PROT 7.0  ALBUMIN 3.5   No results for input(s): "LIPASE", "AMYLASE" in the last 168 hours. No results for input(s): "AMMONIA" in the last 168 hours. Coagulation Profile: Recent Labs  Lab 04/01/22 1030  INR 1.1   Cardiac  Enzymes: No results for input(s): "CKTOTAL", "CKMB", "CKMBINDEX", "TROPONINI" in the last 168 hours. BNP (last 3 results) Recent Labs    02/05/22 1210  PROBNP 717.0*   HbA1C: No results for input(s): "HGBA1C" in the last 72 hours. CBG: Recent Labs  Lab 04/01/22 1218 04/01/22 1222 04/01/22 1254 04/01/22 1325 04/01/22 1432  GLUCAP 39* 84 86 89 114*   Lipid Profile: No results for input(s): "CHOL", "HDL", "LDLCALC", "TRIG", "CHOLHDL", "LDLDIRECT" in the last 72 hours. Thyroid Function Tests: No results for input(s): "TSH", "T4TOTAL", "FREET4", "T3FREE", "THYROIDAB" in the last 72 hours. Anemia Panel: No results for input(s): "VITAMINB12", "FOLATE", "FERRITIN", "TIBC", "IRON", "RETICCTPCT" in the last 72 hours. Urine analysis:    Component Value Date/Time   COLORURINE YELLOW 04/01/2022 [place] 04/01/2022 0834   LABSPEC 1.016 04/01/2022 0834   PHURINE 5.0 04/01/2022 0834   GLUCOSEU NEGATIVE 04/01/2022 0834   GLUCOSEU >=1000 (A) 02/05/2022 1210   HGBUR SMALL (A) 04/01/2022 0834   BILIRUBINUR NEGATIVE 04/01/2022 0834   KETONESUR NEGATIVE 04/01/2022 0834   PROTEINUR 100 (A) 04/01/2022 0834   UROBILINOGEN 0.2 02/05/2022 1210   NITRITE NEGATIVE 04/01/2022 0834   LEUKOCYTESUR NEGATIVE 04/01/2022 0834    Radiological Exams on Admission: No results found.  EKG: Ordered  Assessment/Plan Principal Problem:   Critical limb ischemia of both lower extremities (HCC) Active Problems:   Critical lower limb ischemia ([place])  (please populate well all problems here in Problem List. (For example, if patient is on BP meds at home and you resume or decide to hold them, it is a problem that needs to be her. Same for CAD, COPD, HLD and so on)  Hx of CAD -No chest pains -Given patient has a high risk surgery, will order postoperation troponin and EKG  Polycythemia -Appears to be chronic, persistent elevated H/H least 25-month. Etiology unknown. -Clinically suspect  secondary to iatrogenic iron overload from excessive iron supplement, check iron study, discontinue iron supplement.  Other possibility, pulmonary etiology such as undiagnosed COPD/hypoxia as patient is a chronic cigar smoker.  Check nocturnal O2, recommend outpatient pulmonary follow-up for lung function test.  IDDM with insulin resistance -Most recent A1c 8.8 in November 2023 -Agreed with continue Lantus 15 unit daily and sliding scale -Agreed with holding off metformin for 3 days as patient received IV  contrast.  HTN -Controlled, continue HCTZ/ACEI, amlodipine  Hyponatremia -Euvolemic, likely related to HCTZ.  Hyponatremia is mild, agreed with continue HCTZ.  Cigarette smoking -Cessation education performed at bedside -Add nicotine patch  Right infrainguinal artery occlusion -Status post thrombectomy and bypass of femoral-tibial peroneal artery bypass -On heparin drip -Rest as per primary team  We will follow-up on with you   Lequita Halt MD [facility] Pager [phone] 04/01/2022, 4:11 PM

## 2022-04-01 NOTE — Discharge Instructions (Signed)
° °[facility] ° °Discharge instructions ° °Lower Extremity Bypass Surgery ° °Please refer to the following instruction for your post-procedure care. Your surgeon or physician assistant will discuss any changes with you. ° °Activity ° °You are encouraged to walk as much as you can. You can slowly return to normal activities during the month after your surgery. Avoid strenuous activity and heavy lifting until your doctor tells you it's OK. Avoid activities such as vacuuming or swinging a golf club. Do not drive until your doctor give the OK and you are no longer taking prescription pain medications. It is also normal to have difficulty with sleep habits, eating and bowel movement after surgery. These will go away with time. ° °Bathing/Showering ° °Shower daily after you go home. Do not soak in a bathtub, hot tub, or swim until the incision heals completely. ° °Incision Care ° °Clean your incision with mild soap and water. Shower every day. Pat the area dry with a clean towel. You do not need a bandage unless otherwise instructed. Do not apply any ointments or creams to your incision. If you have open wounds you will be instructed how to care for them or a visiting nurse may be arranged for you. If you have staples or sutures along your incision they will be removed at your post-op appointment. You may have skin glue on your incision. Do not peel it off. It will come off on its own in about one week. ° °Wash the groin wound with soap and water daily and pat dry. (No tub bath-only shower)  Then put a dry gauze or washcloth in the groin to keep this area dry to help prevent wound infection.  Do this daily and as needed.  Do not use Vaseline or neosporin on your incisions.  Only use soap and water on your incisions and then protect and keep dry. ° °Diet ° °Resume your normal diet. There are no special food restrictions following this procedure. A low fat/ low cholesterol diet is  recommended for all patients with vascular disease. In order to heal from your surgery, it is CRITICAL to get adequate nutrition. Your body requires vitamins, minerals, and protein. Vegetables are the best source of vitamins and minerals. Vegetables also provide the perfect balance of protein. Processed food has little nutritional value, so try to avoid this. ° °Medications ° °Resume taking all your medications unless your doctor or physician assistant tells you not to. If your incision is causing pain, you may take over-the-counter pain relievers such as acetaminophen (Tylenol). If you were prescribed a stronger pain medication, please aware these medication can cause nausea and constipation. Prevent nausea by taking the medication with a snack or meal. Avoid constipation by drinking plenty of fluids and eating foods with high amount of fiber, such as fruits, vegetables, and grains. Take Colace 100 mg (an over-the-counter stool softener) twice a day as needed for constipation.  °Do not take Tylenol if you are taking prescription pain medications. ° °Follow Up ° °Our office will schedule a follow up appointment 2-3 weeks following discharge. ° °Please call us immediately for any of the following conditions ° °•Severe or worsening pain in your legs or feet while at rest or while walking •Increase pain, redness, warmth, or drainage (pus) from your incision site(s) °• Fever of 101 degree or higher °• The swelling in your leg with the bypass suddenly worsens and becomes more painful than when you were in the hospital °•   If you have been instructed to feel your graft pulse then you should do so every day. If you can no longer feel this pulse, call the office immediately. Not all patients are given this instruction. °•  °Leg swelling is common after leg bypass surgery. ° °The swelling should improve over a few months following surgery. To improve the swelling, you may elevate your legs above the level of your heart while  you are sitting or resting. Your surgeon or physician assistant may ask you to apply an ACE wrap or wear compression (TED) stockings to help to reduce swelling. ° °Reduce your risk of vascular disease ° °Stop smoking. If you would like help call [facility] at 1-800-QUIT-NOW (1-800-784-8669) or [place] at [phone]. ° °• Manage your cholesterol °• Maintain a desired weight °• Control your diabetes weight °• Control your diabetes °• Keep your blood pressure down °•  °If you have any questions, please call the office at [phone] ° ° °

## 2022-04-01 NOTE — Progress Notes (Signed)
  Day of Surgery Note    Subjective:  resting comfortably   Vitals:   04/01/22 1445 04/01/22 1500  BP: 126/80 119/82  Pulse: 77 74  Resp: (!) 21 20  Temp:    SpO2: 94% 96%    Incisions:   right groin with prevena in place with good seal; right thigh incision clean and dry.   Extremities:  brisk right peroneal and AT doppler flow Cardiac:  regular Lungs:  non labored    Assessment/Plan:  This is a 57 y.o. male who is s/p  Right femoral endarterectomy, right AT thrombectomy and right CFA to TPT with PTFE  -pt with brisk right AT/peroneal doppler flow -Prevena with good seal -TOC was consulted for pricing for DOAC at discharge.  -TRH consulted for medical management (HTN, DM) -to [place] later today   [employer], PA-C 04/01/2022 3:08 PM [phone]

## 2022-04-01 NOTE — Anesthesia Procedure Notes (Signed)
Procedure Name: Intubation Date/Time: 04/01/2022 10:55 AM  Performed by: Rande Brunt, CRNAPre-anesthesia Checklist: Patient identified, Emergency Drugs available, Suction available and Patient being monitored Patient Re-evaluated:Patient Re-evaluated prior to induction Oxygen Delivery Method: Circle System Utilized Preoxygenation: Pre-oxygenation with 100% oxygen Induction Type: IV induction Ventilation: Mask ventilation without difficulty and Oral airway inserted - appropriate to patient size Laryngoscope Size: Mac and 4 Grade View: Grade I Tube type: Oral Tube size: 7.5 mm Number of attempts: 1 Airway Equipment and Method: Stylet and Oral airway Placement Confirmation: ETT inserted through vocal cords under direct vision, positive ETCO2 and breath sounds checked- equal and bilateral Secured at: 21 cm Tube secured with: Tape Dental Injury: Teeth and Oropharynx as per pre-operative assessment

## 2022-04-01 NOTE — Op Note (Signed)
NAME: Jose Morrow    MRN: [id number] DOB: [birth year]    DATE OF OPERATION: 04/01/2022  PREOP DIAGNOSIS:    Critical limb ischemia right lower extremity  POSTOP DIAGNOSIS:    Same  PROCEDURE:    Right femoral endarterectomy Right anterior tibial thrombectomy Right common femoral artery to tibioperoneal trunk bypass with 6 mm ringed PTFE  SURGEON: Judeth Cornfield. Scot Dock, MD  ASSIST: Curt Jews, MD, Leontine Locket, [place]  ANESTHESIA: General  EBL: 150 cc  INDICATIONS:    Jose Morrow is a 57 y.o. male who presented with severe rest pain of his right foot and a wound on the dorsum of his foot.  He underwent an arteriogram and was not a candidate for endovascular approach.  He presents for a right femoropopliteal bypass.  FINDINGS:   There is extensive plaque in the common femoral artery was not appreciated on his arteriogram.  There was also subacute clot in his anterior tibial artery which I was able to thrombectomized and Reasonable backbleeding.  At the completion of the procedure there was a peroneal and anterior tibial signal with the Doppler.  His posterior tibial artery is occluded.  TECHNIQUE:   The patient was taken to the operating room and received a general anesthetic.  I looked at both great saphenous veins myself with the SonoSite and the right great saphenous vein was somewhat marginal in size but looked a little bit larger than was appreciated on his preoperative vein mapping.  I looked in the left leg and the vein here was smaller.  Right leg was prepped and draped in usual sterile fashion.  A longitudinal incision was made in the right groin and through this incision the dissection was carried down to the common femoral artery which was controlled circumferentially.  I also controlled the deep femoral artery and superficial femoral artery.  There was also a posterior branch which was controlled with a vessel loop.  Through this incision I dissected free the  saphenofemoral junction.  The vein immediately bifurcated and I followed the larger of the 2 branches.  I made a separate incision in the proximal thigh and chased the vein lower.  Here the vein became even smaller.  Next a longitudinal incision was made on the medial aspect of the right leg below the knee.  Here the vein was tiny and it was clear that he did not have an adequate vein for an autogenous bypass.  Through the distal incision I exposed the below-knee popliteal artery which had significant plaque.  I had to chase the artery distally by dividing the soleus muscle, dividing the anterior tibial vein to allow exposure of the tibial peroneal trunk and takeoff of the anterior tibial artery.  Here the artery was softer.  A tunnel between the 2 incisions and the patient was heparinized.  Attention was first turned to the proximal anastomosis.  The external iliac artery was clamped and the deep femoral artery and superficial femoral artery were controlled.  A longitudinal arteriotomy was made in the common femoral artery and there was extensive hemorrhagic plaque here.  I endarterectomized this and extended the incision down onto the superficial femoral artery.  The proximal superficial femoral artery was endarterectomized and a plug was retrieved in anticipation of future endovascular options.  I was able to open the posterior branch which had been occluded.  There was good backbleeding from the deep femoral artery.  Proximally the plaque was removed after endarterectomy.  The artery was  irrigated with copious amounts of saline and all loose debris removed.  I then widely spatulated the PTFE graft after the rings were removed proximally.  This was sewn over the arteriotomy which extended from the proximal superficial femoral artery onto the common femoral artery and up towards the external iliac artery.  The anastomosis was done with running 6-0 Prolene suture.  Prior to completing this anastomosis the graft  was clamped the artery was backbled and flushed appropriately and the anastomosis completed.  There was excellent inflow.  The graft then pulled the appropriate length for anastomosis to the below-knee popliteal artery.  A tourniquet was placed on the thigh.  The leg was exsanguinated with an Esmarch bandage.  A longitudinal arteriotomy was made in the distal popliteal artery below the knee and this was extended onto the tibial peroneal trunk allowing exposure of the origin of the anterior tibial artery.  There appeared to be some subacute clot here.  I passed a 3 [place] the entire length down to the foot and was able to retrieve intact and organized clot.  At this point I established backbleeding.  The PTFE graft was cut to the appropriate length and spatulated and sewn into side to the below-knee popliteal artery and tibioperoneal trunk using running 6-0 Prolene suture.  Prior to completing anastomosis the artery was backbled and flushed appropriately and the anastomosis completed.  Flow was reestablished to the right leg.  At this point there was a peroneal and anterior tibial signal with the Doppler.  Heparin was partially reversed with protamine.  Hemostasis was obtained in the wounds.  The right groin incision was closed the deep layer of 2-0 Vicryl, a subcutaneous layer with 3-0 Vicryl and the skin closed with 4-0 Monocryl.  A Prevena dressing was placed over this.  The vein harvest incision on the thigh was closed with a deep layer of 3-0 Vicryl and the skin closed with 4-0 Monocryl.  The incision below the knee was closed with a deep layer of 2-0 Vicryl, a subcutaneous layer with 3-0 Vicryl and the skin closed with 4-0 Monocryl.  Dermabond was applied.  The patient tolerated the procedure well was transferred to recovery room in stable condition.  All needle and sponge counts were correct.  Given the complexity of the case,  the assistant was necessary in order to expedient the procedure and safely  perform the technical aspects of the operation.  The assistant provided traction and countertraction to assist with exposure of the artery proximally and distally.  They assisted with suture ligature of multiple branches.  Their assistance was critical in the performance of both the proximal and distal anastomosis.These skills, especially following the Prolene suture for the anastomosis, could not have been adequately performed by a scrub tech assistant.    Deitra Mayo, MD, FACS [facility]  DATE OF DICTATION:   04/01/2022

## 2022-04-01 NOTE — Anesthesia Procedure Notes (Signed)
Arterial Line Insertion Start/End1/30/2024 10:10 AM, 04/01/2022 10:15 AM Performed by: CRNA  Patient location: Pre-op. Preanesthetic checklist: patient identified, IV checked, site marked, risks and benefits discussed, surgical consent, monitors and equipment checked, pre-op evaluation, timeout performed and anesthesia consent Lidocaine 1% used for infiltration Left, radial was placed Catheter size: 20 G Hand hygiene performed  and maximum sterile barriers used  Allen's test indicative of satisfactory collateral circulation Attempts: 1 Procedure performed without using ultrasound guided technique. Following insertion, dressing applied and Biopatch. Post procedure assessment: normal  Patient tolerated the procedure well with no immediate complications.

## 2022-04-01 NOTE — Transfer of Care (Signed)
Immediate Anesthesia Transfer of Care Note  Patient: Jose Morrow  Procedure(s) Performed: RIGHT FEMORAL- TIBIAL PERONEAL TRUNK BYPASS WITH 6MM RINGED PTFE (Right: Groin) APPLICATION OF PREVENA INCISIONAL WOUND VAC (Right: Groin) ENDARTERECTOMY FEMORAL (Right: Groin) ANTERIOR TIBIAL THROMBECTOMY (Right: Leg Lower)  Patient Location: PACU  Anesthesia Type:General  Level of Consciousness: drowsy and patient cooperative  Airway & Oxygen Therapy: Patient Spontanous Breathing and Patient connected to face mask oxygen  Post-op Assessment: Report given to RN, Post -op Vital signs reviewed and stable, and Patient moving all extremities X 4  Post vital signs: Reviewed and stable  Last Vitals:  Vitals Value Taken Time  BP 112/76 04/01/22 1431  Temp    Pulse 80 04/01/22 1438  Resp 17 04/01/22 1438  SpO2 94 % 04/01/22 1438  Vitals shown include unvalidated device data.  Last Pain:  Vitals:   04/01/22 0854  TempSrc:   PainSc: 9          Complications: No notable events documented.

## 2022-04-01 NOTE — Anesthesia Preprocedure Evaluation (Signed)
Anesthesia Evaluation  Patient identified by MRN, date of birth, ID band Patient awake    Reviewed: Allergy & Precautions, H&P , NPO status , Patient's Chart, lab work & pertinent test results  Airway Mallampati: II   Neck ROM: full    Dental   Pulmonary Current Smoker   breath sounds clear to auscultation       Cardiovascular hypertension, + Peripheral Vascular Disease   Rhythm:regular Rate:Normal     Neuro/Psych    GI/Hepatic   Endo/Other  diabetes, Type 2    Renal/GU      Musculoskeletal   Abdominal   Peds  Hematology   Anesthesia Other Findings   Reproductive/Obstetrics                             Anesthesia Physical Anesthesia Plan  ASA: 3  Anesthesia Plan: General   Post-op Pain Management:    Induction: Intravenous  PONV Risk Score and Plan: 1 and Ondansetron, Dexamethasone, Midazolam and Treatment may vary due to age or medical condition  Airway Management Planned: Oral ETT  Additional Equipment:   Intra-op Plan:   Post-operative Plan: Extubation in OR  Informed Consent: I have reviewed the patients History and Physical, chart, labs and discussed the procedure including the risks, benefits and alternatives for the proposed anesthesia with the patient or authorized representative who has indicated his/her understanding and acceptance.     Dental advisory given  Plan Discussed with: CRNA, Anesthesiologist and Surgeon  Anesthesia Plan Comments:        Anesthesia Quick Evaluation

## 2022-04-01 NOTE — H&P (Signed)
VASCULAR SURGERY:  This patient was seen on 03/20/2022 with rest pain of the right foot and a small wound on the dorsum of the foot.  He underwent an arteriogram on Friday which showed occlusion of the superficial femoral artery at its origin with reconstitution of the below-knee popliteal artery and single-vessel runoff via the peroneal artery.  His vein map showed that the right great saphenous vein was small.  Diameters of the vein ranged from 1.7 mm to 2.4 mm in the right thigh.  The left great saphenous vein was not mapped.  I explained that I will look myself if he has adequate vein on the left, given that the right vein looks fairly small, that I would potentially take vein out of the left leg.  Of note he has no need for bypass on the left currently.  Of note he smokes 1-1/2 packs/day of cigarettes.  We have had a long discussion about the importance of tobacco cessation.  In addition, his blood pressure has been poorly controlled.  This may be partly due to poor compliance.  If we have issues controlling his blood pressure postoperatively we will get help from the medical service.  I have reviewed the indications for lower extremity bypass. I have also reviewed the potential complications of surgery including but not limited to: wound healing problems, infection, graft thrombosis, limb loss, or other unpredictable medical problems. All the patient's questions were answered and they are agreeable to proceed.   Cari Caraway, MD 10:27 AM   ASSESSMENT & PLAN    PERIPHERAL ARTERIAL DISEASE WITH REST PAIN RIGHT FOOT: This patient has evidence of infrainguinal arterial occlusive disease on the right.  He has rest pain on the right and also a small wound on the dorsum of his foot.  Given his history of diabetes this is a limb threatening problem.  I have recommended that we proceed with arteriography.   I have reviewed with the patient the indications for arteriography. In addition, I have  reviewed the potential complications of arteriography including but not limited to: Bleeding, arterial injury, arterial thrombosis, dye action, renal insufficiency, or other unpredictable medical problems. I have explained to the patient that if we find disease amenable to angioplasty we could potentially address this at the same time. I have discussed the potential complications of angioplasty and stenting, including but not limited to: Bleeding, arterial thrombosis, arterial injury, dissection, or the need for surgical intervention.  His arteriogram is scheduled for 03/28/2022.  I will make further recommendations pending these results.   If he is not a candidate for an endovascular intervention but is a candidate for a bypass he will need preoperative vein mapping.   He is on a statin.  I have instructed him to begin taking 81 mg of aspirin daily.   We have discussed the importance of tobacco cessation (3 min).   HYPERTENSION: The patient's initial blood pressure today was elevated. We repeated this and this was still elevated. We have encouraged the patient to follow up with their primary care physician for management of their blood pressure.   Recommend the following which can slow the progression of atherosclerosis and reduce the risk of major adverse cardiac / limb events:  Aspirin 81mg  PO QD.  Atorvastatin 40-80mg  PO QD (or other "high intensity" statin therapy). Complete cessation from all tobacco products. Blood glucose control with goal A1c < 7%. Blood pressure control with goal blood pressure < 140/90 mmHg. Lipid reduction therapy with goal LDL-C <100  mg/dL (<70 if symptomatic from PAD).     REASON FOR CONSULT:     To evaluate for peripheral arterial disease.  The consult is requested by Dr. Scarlette Calico.   HPI:    Jose Morrow is a 57 y.o. male who presents for evaluation of peripheral arterial disease.  He noted the gradual onset of pain in his right foot about 3 months ago.  His  symptoms are brought on by ambulation and relieved with rest.  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  He has also developed rest pain in the right foot.  He gets pain in the foot which is alleviated by hanging the foot down.  He also has a very small wound on the dorsum of his right foot which is new.   His risk factors for peripheral arterial disease include type 2 diabetes, hypertension, hypercholesterolemia, tobacco use, and a family history of premature cardiovascular disease.  He smokes 1-1/2 packs/day and has been smoking for 40 years.  In addition he has poorly controlled blood pressure.   He has a history of mild chronic kidney disease.       Past Medical History:  Diagnosis Date   Diabetes mellitus     Hypertension        History reviewed. No pertinent family history.   SOCIAL HISTORY: Social History         Tobacco Use   Smoking status: Every Day      Packs/day: 1.50      Years: 40.00      Total pack years: 60.00      Types: Cigarettes   Smokeless tobacco: Never  Substance Use Topics   Alcohol use: Yes      Comment: occasionally           Allergies  Allergen Reactions   Penicillins Anaphylaxis   Ibuprofen Other (See Comments)      Pt states caused gastric ulcer            Current Outpatient Medications  Medication Sig Dispense Refill   Accu-Chek Softclix Lancets lancets Use as directed. 100 each 5   amLODipine (NORVASC) 10 MG tablet Take 1 tablet (10 mg total) by mouth daily. 30 tablet 2   atorvastatin (LIPITOR) 20 MG tablet Take 1 tablet (20 mg total) by mouth daily. 90 tablet 3   Blood Glucose Monitoring Suppl (ACCU-CHEK GUIDE) w/Device KIT Use as directed 1 kit 0   Continuous Blood Gluc Receiver (FREESTYLE LIBRE 2 READER) DEVI 1 Act by Does not apply route daily. 2 each 5   Continuous Blood Gluc Sensor (FREESTYLE LIBRE 2 SENSOR) MISC 1 Act by Does not apply route daily. 2 each 5   dapagliflozin propanediol (FARXIGA) 10 MG TABS tablet Take  1 tablet (10 mg total) by mouth daily before breakfast. 90 tablet 1   Glucagon (GVOKE HYPOPEN 2-PACK) 1 MG/0.2ML SOAJ Inject 1 Act into the skin daily as needed. 2 mL 5   glucose blood (ACCU-CHEK GUIDE) test strip Use as instructed 100 each 12   insulin aspart (NOVOLOG FLEXPEN) 100 UNIT/ML FlexPen Inject 15 Units into the skin 3 (three) times daily with meals. 42 mL 1   insulin detemir (LEVEMIR) 100 UNIT/ML FlexPen Inject 50 Units into the skin daily. 45 mL 0   Insulin Pen Needle 32G X 4 MM MISC 1 each by Does not apply route 4 (four) times daily -  with meals and at bedtime. 300 each 1   Iron,  Ferrous Sulfate, 325 (65 Fe) MG TABS Take 325 mg by mouth 2 (two) times daily. 180 tablet 1   lisinopril (ZESTRIL) 5 MG tablet Take 1 tablet (5 mg total) by mouth daily. 90 tablet 2   metFORMIN (GLUCOPHAGE XR) 750 MG 24 hr tablet Take 2 tablets (1,500 mg total) by mouth daily with breakfast. 180 tablet 0   tiZANidine (ZANAFLEX) 4 MG tablet Take 1 tablet (4 mg total) by mouth every 6 (six) hours as needed for muscle spasms. 30 tablet 0   triamterene-hydrochlorothiazide (DYAZIDE) 37.5-25 MG capsule Take 1 each (1 capsule total) by mouth daily. 90 capsule 0   pantoprazole (PROTONIX) 40 MG tablet Take 1 tablet (40 mg total) by mouth 2 (two) times daily before a meal. (Patient not taking: Reported on 02/05/2022) 60 tablet 1    No current facility-administered medications for this visit.      REVIEW OF SYSTEMS:  [X]  denotes positive finding, [ ]  denotes negative finding Cardiac   Comments:  Chest pain or chest pressure:      Shortness of breath upon exertion:      Short of breath when lying flat:      Irregular heart rhythm: x           Vascular      Pain in calf, thigh, or hip brought on by ambulation: x    Pain in feet at night that wakes you up from your sleep:  x    Blood clot in your veins:      Leg swelling:  x           Pulmonary      Oxygen at home:      Productive cough:       Wheezing:               Neurologic      Sudden weakness in arms or legs:       Sudden numbness in arms or legs:       Sudden onset of difficulty speaking or slurred speech:      Temporary loss of vision in one eye:       Problems with dizziness:              Gastrointestinal      Blood in stool:       Vomited blood:              Genitourinary      Burning when urinating:       Blood in urine:             Psychiatric      Major depression:  x           Hematologic      Bleeding problems:      Problems with blood clotting too easily:             Skin      Rashes or ulcers:             Constitutional      Fever or chills:      -   PHYSICAL EXAM:       Vitals:    03/20/22 1443  BP: (!) 228/123  Pulse: 86  Resp: 20  Temp: 98.5 F (36.9 C)  SpO2: 96%  Weight: 160 lb (72.6 kg)  Height: 5\' 9"  (1.753 m)    Body mass index is 23.63 kg/m. GENERAL: The patient is a well-nourished male, in no acute  distress. The vital signs are documented above. CARDIAC: There is a regular rate and rhythm.  VASCULAR: I do not detect carotid bruits.   On the right side he has a palpable femoral pulse.  I cannot palpate a popliteal or pedal pulses.   On the left side he has a palpable femoral, popliteal, and posterior tibial pulse.  He also has a palpable dorsalis pedis pulse.   PULMONARY: There is good air exchange bilaterally without wheezing or rales. ABDOMEN: Soft and non-tender with normal pitched bowel sounds.  MUSCULOSKELETAL: There are no major deformities. NEUROLOGIC: No focal weakness or paresthesias are detected. SKIN: He has a wound on the right foot as noted below.  PSYCHIATRIC: The patient has a normal affect.   DATA:     ARTERIAL DOPPLER STUDY: I have reviewed the arterial Doppler study that was done on 02/28/2022.   On the right side there was a monophasic dorsalis pedis signal with the Doppler.  The posterior tibial signal was absent.  ABI was 33%.  Toe pressure was 0.   On the  left side there was a triphasic posterior tibial signal.  The dorsalis pedis signal is monophasic.  ABI was 100% although possibly falsely elevated secondary to calcific disease.  Toe pressure was 160 mmHg.   LABS: I reviewed the labs from 02/05/2022.  GFR was greater than 60.  Creatinine was 1.2.

## 2022-04-01 NOTE — Progress Notes (Signed)
EKG reviewed, similar ST-T changes on V1, V2 and V3 as the EKG in December 2023. Trop pending.

## 2022-04-02 ENCOUNTER — Encounter (HOSPITAL_COMMUNITY): Payer: Self-pay | Admitting: Vascular Surgery

## 2022-04-02 ENCOUNTER — Telehealth (HOSPITAL_COMMUNITY): Payer: Self-pay | Admitting: Pharmacy Technician

## 2022-04-02 ENCOUNTER — Other Ambulatory Visit (HOSPITAL_COMMUNITY): Payer: Self-pay

## 2022-04-02 DIAGNOSIS — I70223 Atherosclerosis of native arteries of extremities with rest pain, bilateral legs: Secondary | ICD-10-CM | POA: Diagnosis not present

## 2022-04-02 LAB — APTT: aPTT: 27 seconds (ref 24–36)

## 2022-04-02 LAB — CBC
HCT: 47 % (ref 39.0–52.0)
Hemoglobin: 15.5 g/dL (ref 13.0–17.0)
MCH: 27.1 pg (ref 26.0–34.0)
MCHC: 33 g/dL (ref 30.0–36.0)
MCV: 82.3 fL (ref 80.0–100.0)
Platelets: 219 10*3/uL (ref 150–400)
RBC: 5.71 MIL/uL (ref 4.22–5.81)
RDW: 16.3 % — ABNORMAL HIGH (ref 11.5–15.5)
WBC: 8.2 10*3/uL (ref 4.0–10.5)
nRBC: 0 % (ref 0.0–0.2)

## 2022-04-02 LAB — LIPID PANEL
Cholesterol: 127 mg/dL (ref 0–200)
HDL: 49 mg/dL (ref >40)
LDL Cholesterol: 39 mg/dL (ref 0–99)
Total CHOL/HDL Ratio: 2.6 RATIO
Triglycerides: 194 mg/dL — ABNORMAL HIGH (ref <150)
VLDL: 39 mg/dL (ref 0–40)

## 2022-04-02 LAB — BASIC METABOLIC PANEL
Anion gap: 9 (ref 5–15)
BUN: 21 mg/dL — ABNORMAL HIGH (ref 6–20)
CO2: 25 mmol/L (ref 22–32)
Calcium: 8.3 mg/dL — ABNORMAL LOW (ref 8.9–10.3)
Chloride: 96 mmol/L — ABNORMAL LOW (ref 98–111)
Creatinine, Ser: 1.59 mg/dL — ABNORMAL HIGH (ref 0.61–1.24)
GFR, Estimated: 51 mL/min — ABNORMAL LOW (ref >60)
Glucose, Bld: 191 mg/dL — ABNORMAL HIGH (ref 70–99)
Potassium: 3.7 mmol/L (ref 3.5–5.1)
Sodium: 130 mmol/L — ABNORMAL LOW (ref 135–145)

## 2022-04-02 LAB — GLUCOSE, CAPILLARY
Glucose-Capillary: 212 mg/dL — ABNORMAL HIGH (ref 70–99)
Glucose-Capillary: 231 mg/dL — ABNORMAL HIGH (ref 70–99)
Glucose-Capillary: 257 mg/dL — ABNORMAL HIGH (ref 70–99)
Glucose-Capillary: 129 mg/dL — ABNORMAL HIGH (ref 70–99)

## 2022-04-02 MED ORDER — RIVAROXABAN 2.5 MG PO TABS
2.5000 mg | ORAL_TABLET | Freq: Two times a day (BID) | ORAL | Status: DC
  Administered 2022-04-02 – 2022-04-03 (×3): 2.5 mg via ORAL
  Filled 2022-04-02 (×4): qty 1

## 2022-04-02 MED ORDER — SODIUM CHLORIDE 0.9 % IV SOLN: INTRAVENOUS | Status: DC

## 2022-04-02 MED ORDER — TRIAMTERENE-HCTZ 37.5-25 MG PO CAPS: 1.0000 | ORAL_CAPSULE | Freq: Every day | ORAL | Status: DC

## 2022-04-02 MED ORDER — TRIAMTERENE-HCTZ 37.5-25 MG PO TABS
1.0000 | ORAL_TABLET | Freq: Every day | ORAL | Status: DC
  Administered 2022-04-02: 1 via ORAL
  Filled 2022-04-02: qty 1

## 2022-04-02 MED ORDER — ASPIRIN 81 MG PO TBEC
81.0000 mg | DELAYED_RELEASE_TABLET | Freq: Every day | ORAL | Status: DC
  Administered 2022-04-02 – 2022-04-03 (×2): 81 mg via ORAL
  Filled 2022-04-02 (×2): qty 1

## 2022-04-02 NOTE — TOC Benefit Eligibility Note (Signed)
Patient Jose Morrow, English as a foreign language completed.    The patient is currently admitted and upon discharge could be taking Xarelto 2.5 mg.  Requires Prior Authorization  The patient is insured through [employer], [place] [facility] [place] Patient Advocate Team Direct Number: [phone]  Fax: [phone]

## 2022-04-02 NOTE — Progress Notes (Signed)
PROGRESS NOTE    DONTA FUSTER  WIO:[id number] DOB: 02/07/1966 DOA: 04/01/2022 PCP: Janith Lima, MD    Brief Narrative:  57 year old gentleman with history of peripheral vascular disease with chronic claudication, hypertension, type 2 diabetes on insulin, smoker who presented with severe resting right foot pain and found to have acute arterial occlusive.  He was emergently taken to surgery and underwent right femoral endarterectomy, right anterior tibial thrombectomy and right common femoral artery to tibial peroneal trunk bypass.  Medicine consulted for comanagement.   Assessment & Plan:   Critical limb ischemia: Status post emergent vascular surgery.  Stable as per surgery.  Postop management per surgery.  Heparin is stopped.  Started on low-dose Xarelto.  Remains on aspirin 81 mg daily.  Already on atorvastatin.  Essential hypertension: Blood pressures stable.  Continue amlodipine and losartan.  Will hold hydrochlorothiazide today given acute kidney injury.  Acute kidney injury with hyponatremia: Underwent emergent surgery.  Also on diuretics.  Will hold hydrochlorothiazide today.  Gentle isotonic fluids 75 mill per hour for next 24 hours.  Recheck tomorrow morning.  IDDM with long-term use of insulin: Metformin on hold.  Continue Lantus and sliding scale.  Smoker: Counseled to quit.  Nicotine patch.  Polycythemia: Probably hemoconcentrated.  Improved.   DVT prophylaxis: SCD's Start: 04/01/22 1601 rivaroxaban (XARELTO) tablet 2.5 mg   Code Status: Full code Family Communication: None at the bedside Disposition Plan: Status is: Inpatient Remains inpatient appropriate because: Immediate postop     Consultants:  [facility]  Procedures:  Vascular surgery as above  Antimicrobials:  Perioperative   Subjective: Patient seen in the morning rounds.  Denies any complaints.  Walked around and felt better.  Objective: Vitals:   04/02/22 0300 04/02/22 0736 04/02/22 0800  04/02/22 1000  BP: 134/81 (!) 143/83 (!) 146/86 139/79  Pulse: 67 99 67 73  Resp: 19 20 14 15   Temp: 98 F (36.7 C) 98 F (36.7 C)    TempSrc: Oral Oral    SpO2: 97% 100% 100% 99%  Weight:      Height:        Intake/Output Summary (Last 24 hours) at 04/02/2022 1040 Last data filed at 04/02/2022 1000 Gross per 24 hour  Intake 3802.23 ml  Output 1500 ml  Net 2302.23 ml   Filed Weights   04/01/22 0823 04/01/22 1600  Weight: 76.2 kg 71.9 kg    Examination:  General exam: Appears calm and comfortable, pleasant to interaction. Respiratory system: No added sound Cardiovascular system: S1 & S2 heard, RRR. No pedal edema. Gastrointestinal system: Soft.  Nontender.   Central nervous system: Alert and oriented. No focal neurological deficits. Extremities: Symmetric 5 x 5 power. Skin:  He has wound VAC in place looks clean and dry.  Adequate perfusion and normal temperature of the extremities.    Data Reviewed: I have personally reviewed following labs and imaging studies  CBC: Recent Labs  Lab 03/28/22 1001 04/01/22 0931 04/01/22 1614 04/02/22 0612  WBC  --  5.4 8.2 8.2  HGB 20.7* 20.1* 17.7* 15.5  HCT 61.0* 62.1* 52.2* 47.0  MCV  --  82.6 80.6 82.3  PLT  --  208 201 426   Basic Metabolic Panel: Recent Labs  Lab 03/28/22 1001 04/01/22 0931 04/02/22 0612  NA 138 134* 130*  K 3.3* 3.8 3.7  CL 98 98 96*  CO2  --  24 25  GLUCOSE 169* 179* 191*  BUN 15 17 21*  CREATININE 1.20 1.24 1.59*  CALCIUM  --  9.5 8.3*   GFR: Estimated Creatinine Clearance: 46.8 mL/min (A) (by C-G formula based on SCr of 1.59 mg/dL (H)). Liver Function Tests: Recent Labs  Lab 04/01/22 0931  AST 26  ALT 8  ALKPHOS 58  BILITOT 2.2*  PROT 7.0  ALBUMIN 3.5   No results for input(s): "LIPASE", "AMYLASE" in the last 168 hours. No results for input(s): "AMMONIA" in the last 168 hours. Coagulation Profile: Recent Labs  Lab 04/01/22 1030  INR 1.1   Cardiac Enzymes: No results for  input(s): "CKTOTAL", "CKMB", "CKMBINDEX", "TROPONINI" in the last 168 hours. BNP (last 3 results) Recent Labs    02/05/22 1210  PROBNP 717.0*   HbA1C: No results for input(s): "HGBA1C" in the last 72 hours. CBG: Recent Labs  Lab 04/01/22 1325 04/01/22 1432 04/01/22 1622 04/01/22 2113 04/02/22 0616  GLUCAP 89 114* 129* 351* 212*   Lipid Profile: Recent Labs    04/02/22 0612  CHOL 127  HDL 49  LDLCALC 39  TRIG 194*  CHOLHDL 2.6   Thyroid Function Tests: No results for input(s): "TSH", "T4TOTAL", "FREET4", "T3FREE", "THYROIDAB" in the last 72 hours. Anemia Panel: Recent Labs    04/01/22 1614  FERRITIN 20*  TIBC 368  IRON 271*  RETICCTPCT 1.6   Sepsis Labs: No results for input(s): "PROCALCITON", "LATICACIDVEN" in the last 168 hours.  Recent Results (from the past 240 hour(s))  Surgical pcr screen     Status: None   Collection Time: 04/01/22  8:35 AM   Specimen: Nasal Mucosa; Nasal Swab  Result Value Ref Range Status   MRSA, PCR NEGATIVE NEGATIVE Final   Staphylococcus aureus NEGATIVE NEGATIVE Final    Comment: (NOTE) The Xpert SA Assay (FDA approved for NASAL specimens in patients 36 years of age and older), is one component of a comprehensive surveillance program. It is not intended to diagnose infection nor to guide or monitor treatment. Performed at [facility], [place] [address], [place], [place] [address]          Radiology Studies: No results found.      Scheduled Meds:  amLODipine  10 mg Oral Daily   aspirin EC  81 mg Oral Daily   atorvastatin  20 mg Oral Daily   docusate sodium  100 mg Oral Daily   insulin aspart  0-15 Units Subcutaneous TID WC   insulin detemir  50 Units Subcutaneous Daily   lisinopril  5 mg Oral Daily   nicotine  21 mg Transdermal Daily   pantoprazole  40 mg Oral BID AC   rivaroxaban  2.5 mg Oral BID   Continuous Infusions:  sodium chloride     sodium chloride 75 mL/hr at 04/02/22 1000   sodium chloride      magnesium sulfate bolus IVPB     vancomycin 1,000 mg (04/02/22 1007)     LOS: 1 day    Time spent: 35 minutes    Barb Merino, MD [facility] Pager [phone]

## 2022-04-02 NOTE — Progress Notes (Signed)
Patient's A-line does not have blood return. A-line dced calling lab to come draw the morning lab.

## 2022-04-02 NOTE — Evaluation (Addendum)
Occupational Therapy Evaluation Patient Details Name: Jose Morrow MRN: [id number] DOB: 10-Aug-1965 Today's Date: 04/02/2022   History of Present Illness Pt is a 57 y.o. male who presented 04/01/22 for right femoral endarterectomy, right anterior tibial thrombectomy, and right common femoral artery to tibioperoneal trunk bypass for right lower extremity critical limb ischemia. PMH: DM, HTN, PVD   Clinical Impression   PTA patient independent and working. Admitted for above and presents with problem list below. Pt limited by pain in R LE, impaired balance and decreased activity tolerance. Using RW for mobility and transfers with min guard, ADLs he requires up to min assist.  Educated on compensatory techniques for LB dressing, DME and safety. Based on performance today, will follow acutely but anticipate no further needs after dc home.        Recommendations for follow up therapy are one component of a multi-disciplinary discharge planning process, led by the attending physician.  Recommendations may be updated based on patient status, additional functional criteria and insurance authorization.   Follow Up Recommendations  No OT follow up     Assistance Recommended at Discharge Intermittent Supervision/Assistance  Patient can return home with the following A little help with walking and/or transfers;A little help with bathing/dressing/bathroom;Help with stairs or ramp for entrance;Assist for transportation;Assistance with cooking/housework    Functional Status Assessment  Patient has had a recent decline in their functional status and demonstrates the ability to make significant improvements in function in a reasonable and predictable amount of time.  Equipment Recommendations  BSC/3in1;Other (comment) (RW)    Recommendations for Other Services       Precautions / Restrictions Precautions Precautions: Fall Precaution Comments: R groin wound vac Restrictions Weight Bearing  Restrictions: No      Mobility Bed Mobility               General bed mobility comments: OOB in recliner    Transfers Overall transfer level: Needs assistance Equipment used: Rolling walker (2 wheels) Transfers: Sit to/from Stand Sit to Stand: Min guard           General transfer comment: cueing for hand placement, safety.      Balance Overall balance assessment: Needs assistance Sitting-balance support: No upper extremity supported, Feet supported Sitting balance-Leahy Scale: Good     Standing balance support: Bilateral upper extremity supported, During functional activity, Reliant on assistive device for balance, Single extremity supported, No upper extremity supported Standing balance-Leahy Scale: Poor Standing balance comment: Reliant on RW, 0-1 hand support grooming standing at sink with min guard                           ADL either performed or assessed with clinical judgement   ADL Overall ADL's : Needs assistance/impaired     Grooming: Wash/dry hands;Min guard;Standing           Upper Body Dressing : Set up;Sitting   Lower Body Dressing: Minimal assistance;Sit to/from stand Lower Body Dressing Details (indicate cue type and reason): requires assist with R LE mgmt, discussed compesnatory techniques but may benefit from AE training Toilet Transfer: Min guard;Ambulation;Rolling walker (2 wheels)           Functional mobility during ADLs: Min guard;Rolling walker (2 wheels)       Vision   Vision Assessment?: No apparent visual deficits     Perception     Praxis      Pertinent Vitals/Pain Pain Assessment Pain Assessment: Faces  Faces Pain Scale: Hurts even more Pain Location: R lower extremity Pain Descriptors / Indicators: Discomfort, Grimacing, Operative site guarding Pain Intervention(s): Monitored during session, Limited activity within patient's tolerance, Repositioned     Hand Dominance Right   Extremity/Trunk  Assessment Upper Extremity Assessment Upper Extremity Assessment: Overall WFL for tasks assessed   Lower Extremity Assessment Lower Extremity Assessment: Defer to PT evaluation RLE Deficits / Details: reports some mild numbness below the knee with light touch, but reports improved since prior to surgery overall; pain limiting AROM, particularly with ankle dorsiflexion and knee flexion RLE Sensation: decreased light touch   Cervical / Trunk Assessment Cervical / Trunk Assessment: Normal   Communication Communication Communication: HOH   Cognition Arousal/Alertness: Awake/alert Behavior During Therapy: WFL for tasks assessed/performed Overall Cognitive Status: Within Functional Limits for tasks assessed                                       General Comments       Exercises     Shoulder Instructions      Home Living Family/patient expects to be discharged to:: Private residence Living Arrangements: Spouse/significant other Available Help at Discharge: Family;Available 24 hours/day Type of Home: House Home Access: Stairs to enter [employer] of Steps: 5 Entrance Stairs-Rails: Can reach both Home Layout: One level     Bathroom Shower/Tub: Teacher, early years/pre: Standard     Home Equipment: Crutches          Prior Functioning/Environment Prior Level of Function : Independent/Modified Independent;Driving;Working/employed             Mobility Comments: No AD. ADLs Comments: Housekeeper at [employer]        OT Problem List: Decreased strength;Decreased activity tolerance;Impaired balance (sitting and/or standing);Decreased knowledge of use of DME or AE;Decreased knowledge of precautions;Pain      OT Treatment/Interventions: Self-care/ADL training;DME and/or AE instruction;Therapeutic activities;Balance training;Patient/family education    OT Goals(Current goals can be found in the care plan section) Acute Rehab OT  Goals Patient Stated Goal: home OT Goal Formulation: With patient Time For Goal Achievement: 04/16/22 Potential to Achieve Goals: Good  OT Frequency: Min 2X/week    Co-evaluation              AM-PAC OT "6 Clicks" Daily Activity     Outcome Measure Help from another person eating meals?: None Help from another person taking care of personal grooming?: A Little Help from another person toileting, which includes using toliet, bedpan, or urinal?: A Little Help from another person bathing (including washing, rinsing, drying)?: A Little Help from another person to put on and taking off regular upper body clothing?: A Little Help from another person to put on and taking off regular lower body clothing?: A Little 6 Click Score: 19   End of Session Equipment Utilized During Treatment: Rolling walker (2 wheels) Nurse Communication: Mobility status  Activity Tolerance: Patient tolerated treatment well Patient left: in chair;with call bell/phone within reach  OT Visit Diagnosis: Other abnormalities of gait and mobility (R26.89);Pain Pain - Right/Left: Right Pain - part of body: Ankle and joints of foot;Leg                Time: 4098-1191 OT Time Calculation (min): 13 min Charges:  OT General Charges $OT Visit: 1 Visit OT Evaluation $OT Eval Low Complexity: 1 Low  Jolaine Artist, OT Acute Rehabilitation Services  Office [place] 04/02/2022, 10:30 AM

## 2022-04-02 NOTE — Telephone Encounter (Signed)
Patient Advocate Encounter  Prior Authorization for Xarelto 2.5MG  tablets has been approved.     Key: [id number] Effective dates: 04/02/2022 through 04/02/2023  Patients co-pay is $15.00.     Lyndel Safe, [place] Patient Advocate Specialist [place] Patient Advocate Team Direct Number: [phone]  Fax: [phone]

## 2022-04-02 NOTE — Evaluation (Signed)
Physical Therapy Evaluation Patient Details Name: Jose Morrow MRN: [id number] DOB: October 15, 1965 Today's Date: 04/02/2022  History of Present Illness  Pt is a 57 y.o. male who presented 04/01/22 for right femoral endarterectomy, right anterior tibial thrombectomy, and right common femoral artery to tibioperoneal trunk bypass for right lower extremity critical limb ischemia. PMH: DM, HTN, PVD   Clinical Impression  Pt presents with condition above and deficits mentioned below, see PT Problem List. PTA, he was independent without an AD, driving, working as a Secretary/administrator at [employer], and living with his significant other in a 1-level house with 5 STE. Currently, pt is primarily limited in R lower extremity AROM and weight bearing tolerance, impacting functional mobility, secondary to pain. Pt is expected to progress well as his pain improves though, thus no follow-up PT likely needed at d/c. Pt was able to perform all functional mobility at a min guard assist level using a RW today. Will continue to follow acutely to maximize pt's return to baseline prior to d/c home.       Recommendations for follow up therapy are one component of a multi-disciplinary discharge planning process, led by the attending physician.  Recommendations may be updated based on patient status, additional functional criteria and insurance authorization.  Follow Up Recommendations No PT follow up      Assistance Recommended at Discharge Intermittent Supervision/Assistance  Patient can return home with the following  A little help with walking and/or transfers;A little help with bathing/dressing/bathroom;Assistance with cooking/housework;Assist for transportation;Help with stairs or ramp for entrance    Equipment Recommendations Rolling walker (2 wheels)  Recommendations for Other Services       Functional Status Assessment Patient has had a recent decline in their functional status and demonstrates the ability to make  significant improvements in function in a reasonable and predictable amount of time.     Precautions / Restrictions Precautions Precautions: Fall Precaution Comments: R groin wound vac Restrictions Weight Bearing Restrictions: No      Mobility  Bed Mobility Overal bed mobility: Needs Assistance Bed Mobility: Supine to Sit     Supine to sit: Supervision, HOB elevated     General bed mobility comments: Extra time due to pain, HOB elevated    Transfers Overall transfer level: Needs assistance Equipment used: Rolling walker (2 wheels) Transfers: Sit to/from Stand Sit to Stand: Min guard           General transfer comment: Extra time and effort to come to stand due to pain, pt placing R leg anteriorly to L with transfers.    Ambulation/Gait Ambulation/Gait assistance: Min guard Gait Distance (Feet): 180 Feet Assistive device: Rolling walker (2 wheels) Gait Pattern/deviations: Step-to pattern, Decreased stance time - right, Decreased step length - left, Decreased stride length, Decreased dorsiflexion - right, Antalgic, Trunk flexed Gait velocity: reduced Gait velocity interpretation: <1.31 ft/sec, indicative of household ambulator   General Gait Details: Pt with slow, antalgic, step--to gait pattern due to R leg pain with weight bearing. Pt cued to improve upright posture, step-through gait pattern, and heel to toe sequencing on R foot. Min guard for safety, no LOB  Stairs            Wheelchair Mobility    Modified Rankin (Stroke Patients Only)       Balance Overall balance assessment: Needs assistance Sitting-balance support: No upper extremity supported, Feet supported Sitting balance-Leahy Scale: Good     Standing balance support: Bilateral upper extremity supported, During functional activity, Reliant on  assistive device for balance Standing balance-Leahy Scale: Poor Standing balance comment: Reliant on RW                              Pertinent Vitals/Pain Pain Assessment Pain Assessment: Faces Faces Pain Scale: Hurts even more Pain Location: R lower extremity Pain Descriptors / Indicators: Discomfort, Grimacing, Operative site guarding Pain Intervention(s): Limited activity within patient's tolerance, Monitored during session, Repositioned    Home Living Family/patient expects to be discharged to:: Private residence Living Arrangements: Spouse/significant other Available Help at Discharge: Family;Available 24 hours/day Type of Home: House Home Access: Stairs to enter Entrance Stairs-Rails: Can reach both Entrance Stairs-Number of Steps: 5   Home Layout: One level Home Equipment: Crutches      Prior Function Prior Level of Function : Independent/Modified Independent;Driving;Working/employed             Mobility Comments: No AD. 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: Right    Extremity/Trunk Assessment   Upper Extremity Assessment Upper Extremity Assessment: Defer to OT evaluation    Lower Extremity Assessment Lower Extremity Assessment: RLE deficits/detail RLE Deficits / Details: reports some mild numbness below the knee with light touch, but reports improved since prior to surgery overall; pain limiting AROM, particularly with ankle dorsiflexion and knee flexion RLE Sensation: decreased light touch    Cervical / Trunk Assessment Cervical / Trunk Assessment: Normal  Communication   Communication: HOH  Cognition Arousal/Alertness: Awake/alert Behavior During Therapy: WFL for tasks assessed/performed Overall Cognitive Status: Within Functional Limits for tasks assessed                                          General Comments      Exercises General Exercises - Lower Extremity Ankle Circles/Pumps: AROM, Right, 5 reps, Supine (encouraged ankle dorsiflexion stretch with towel/sheet) Quad Sets: AROM, Right, Supine, 10 reps Heel  Slides: AROM, Right, 10 reps, Supine   Assessment/Plan    PT Assessment Patient needs continued PT services  PT Problem List Decreased range of motion;Decreased balance;Decreased activity tolerance;Decreased mobility;Impaired sensation;Pain       PT Treatment Interventions Gait training;DME instruction;Functional mobility training;Stair training;Therapeutic activities;Therapeutic exercise;Balance training;Neuromuscular re-education;Patient/family education    PT Goals (Current goals can be found in the Care Plan section)  Acute Rehab PT Goals Patient Stated Goal: to improve PT Goal Formulation: With patient Time For Goal Achievement: 04/16/22 Potential to Achieve Goals: Good    Frequency Min 3X/week     Co-evaluation               AM-PAC PT "6 Clicks" Mobility  Outcome Measure Help needed turning from your back to your side while in a flat bed without using bedrails?: A Little Help needed moving from lying on your back to sitting on the side of a flat bed without using bedrails?: A Little Help needed moving to and from a bed to a chair (including a wheelchair)?: A Little Help needed standing up from a chair using your arms (e.g., wheelchair or bedside chair)?: A Little Help needed to walk in hospital room?: A Little Help needed climbing 3-5 steps with a railing? : A Little 6 Click Score: 18    End of Session Equipment Utilized During Treatment: Gait belt Activity Tolerance: Patient tolerated treatment well Patient  left: in chair;with call bell/phone within reach Nurse Communication: Mobility status;Other (comment) (no chair alarm as pt verbalized understanding not to get up alone) PT Visit Diagnosis: Unsteadiness on feet (R26.81);Other abnormalities of gait and mobility (R26.89);Difficulty in walking, not elsewhere classified (R26.2);Pain Pain - Right/Left: Right Pain - part of body: Leg    Time: 8315-1761 PT Time Calculation (min) (ACUTE ONLY): 22 min   Charges:    PT Evaluation $PT Eval Moderate Complexity: 1 Mod          Moishe Spice, PT, DPT Acute Rehabilitation Services  Office: [phone]   Orvan Falconer 04/02/2022, 9:04 AM

## 2022-04-02 NOTE — Anesthesia Postprocedure Evaluation (Signed)
Anesthesia Post Note  Patient: Jose Morrow  Procedure(s) Performed: RIGHT FEMORAL- TIBIAL PERONEAL TRUNK BYPASS WITH 6MM RINGED PTFE (Right: Groin) APPLICATION OF PREVENA INCISIONAL WOUND VAC (Right: Groin) ENDARTERECTOMY FEMORAL (Right: Groin) ANTERIOR TIBIAL THROMBECTOMY (Right: Leg Lower)     Patient location during evaluation: PACU Anesthesia Type: General Level of consciousness: awake and alert Pain management: pain level controlled Vital Signs Assessment: post-procedure vital signs reviewed and stable Respiratory status: spontaneous breathing, nonlabored ventilation, respiratory function stable and patient connected to nasal cannula oxygen Cardiovascular status: blood pressure returned to baseline and stable Postop Assessment: no apparent nausea or vomiting Anesthetic complications: no   No notable events documented.  Last Vitals:  Vitals:   04/01/22 2300 04/02/22 0300  BP: 117/84 134/81  Pulse: 72 67  Resp: 17 19  Temp: 36.7 C 36.7 C  SpO2: 98% 97%    Last Pain:  Vitals:   04/02/22 0611  TempSrc:   PainSc: [place]

## 2022-04-02 NOTE — Progress Notes (Addendum)
   VASCULAR SURGERY ASSESSMENT & PLAN:   POD 1 FEM END/ FEM TPT BYPASS: His bypass graft is patent and his right foot is hyperemic.  He has excellent Doppler signals.  VASCULAR QUALITY INITIATIVE: He is on a statin. I have ordered ASA (81 mg) this AM.   ANTICOAGULATION: He is on 400 units an hour of heparin.  We will discontinue this and do low-dose Xarelto given that he has a prosthetic bypass into the tibial peroneal trunk. (2.5 mg po BID).  HYPERTENSION: His blood pressure is under better control.  Appreciate Dr. Delories Heinz help.  He is also helping with the patient's multiple medical issues including diabetes, h/o CAD, H/O hyponatremia, and H/O polycythemia.  DISPOSITION: Possibly home tomorrow if arrangements can be made for his Xarelto with no co-pay.   SUBJECTIVE:   No specific complaints this morning.  PHYSICAL EXAM:   Vitals:   04/01/22 1801 04/01/22 1900 04/01/22 2300 04/02/22 0300  BP: (!) 135/90 (!) 145/96 117/84 134/81  Pulse: 70 87 72 67  Resp: 16 19 17 19   Temp:  (!) 97.5 F (36.4 C) 98 F (36.7 C) 98 F (36.7 C)  TempSrc:  Oral Oral Oral  SpO2: 98% 97% 98% 97%  Weight:      Height:       The Praveena has a good seal. The right foot is warm. He has a anterior tibial, dorsalis pedis, peroneal, and even a faint posterior tibial signal with the Doppler. The wound on the foot is dry.  LABS:   Lab Results  Component Value Date   WBC 8.2 04/02/2022   HGB 15.5 04/02/2022   HCT 47.0 04/02/2022   MCV 82.3 04/02/2022   PLT 219 04/02/2022   Lab Results  Component Value Date   CREATININE 1.59 (H) 04/02/2022   Lab Results  Component Value Date   INR 1.1 04/01/2022   CBG (last 3)  Recent Labs    04/01/22 1622 04/01/22 2113 04/02/22 0616  GLUCAP 129* 351* 212*    PROBLEM LIST:    Principal Problem:   Critical limb ischemia of both lower extremities (HCC) Active Problems:   Critical lower limb ischemia (HCC)   CURRENT MEDS:    amLODipine  10 mg  Oral Daily   atorvastatin  20 mg Oral Daily   docusate sodium  100 mg Oral Daily   insulin aspart  0-15 Units Subcutaneous TID WC   insulin detemir  50 Units Subcutaneous Daily   lisinopril  5 mg Oral Daily   nicotine  21 mg Transdermal Daily   pantoprazole  40 mg Oral BID AC   triamterene-hydrochlorothiazide  1 tablet Oral Daily    Deitra Mayo Office: [phone] 04/02/2022

## 2022-04-02 NOTE — Progress Notes (Signed)
ANTICOAGULATION CONSULT NOTE - Initial Consult  Pharmacy Consult for Rivaroxaban Indication:  thrombotic risk reduction s/p R femoral endarterectomy, R anterior tibial thrombectomy and R common femoral artery to tibioperoneal trunk bypass on 04/01/22  Allergies  Allergen Reactions   Penicillins Anaphylaxis   Ibuprofen Other (See Comments)    Pt states caused gastric ulcer    Patient Measurements: Height: 5\' 6"  (167.6 cm) Weight: 71.9 kg (158 lb 8.2 oz) IBW/kg (Calculated) : 63.8  Vital Signs: Temp: 98 F (36.7 C) (01/31 0300) Temp Source: Oral (01/31 0300) BP: 134/81 (01/31 0300) Pulse Rate: 67 (01/31 0300)  Labs: Recent Labs    04/01/22 0931 04/01/22 1030 04/01/22 1614 04/02/22 0612  HGB 20.1*  --  17.7* 15.5  HCT 62.1*  --  52.2* 47.0  PLT 208  --  201 219  APTT  --  28  --  27  LABPROT  --  14.2  --   --   INR  --  1.1  --   --   CREATININE 1.24  --   --  1.59*    Estimated Creatinine Clearance: 46.8 mL/min (A) (by C-G formula based on SCr of 1.59 mg/dL (H)).   Medical History: Past Medical History:  Diagnosis Date   Diabetes mellitus    Hypertension    Peripheral vascular disease ([place])     Medications:  Medications Prior to Admission  Medication Sig Dispense Refill Last Dose   amLODipine (NORVASC) 10 MG tablet Take 1 tablet (10 mg total) by mouth daily. 30 tablet 2 04/01/2022 at 0730   insulin detemir (LEVEMIR) 100 UNIT/ML FlexPen Inject 50 Units into the skin daily. 45 mL 0 04/01/2022 at 0730   lisinopril (ZESTRIL) 5 MG tablet Take 1 tablet (5 mg total) by mouth daily. 90 tablet 2 03/31/2022   triamterene-hydrochlorothiazide (DYAZIDE) 37.5-25 MG capsule Take 1 each (1 capsule total) by mouth daily. 90 capsule 0 04/01/2022 at 0730   Accu-Chek Softclix Lancets lancets Use as directed. 100 each 5    atorvastatin (LIPITOR) 20 MG tablet Take 1 tablet (20 mg total) by mouth daily. (Patient not taking: Reported on 03/31/2022) 90 tablet 3 Not Taking   Blood Glucose  Monitoring Suppl (ACCU-CHEK GUIDE) w/Device KIT Use as directed 1 kit 0    Continuous Blood Gluc Receiver (FREESTYLE LIBRE 2 READER) DEVI 1 Act by Does not apply route daily. 2 each 5    Continuous Blood Gluc Sensor (FREESTYLE LIBRE 2 SENSOR) MISC 1 Act by Does not apply route daily. 2 each 5    dapagliflozin propanediol (FARXIGA) 10 MG TABS tablet Take 1 tablet (10 mg total) by mouth daily before breakfast. (Patient not taking: Reported on 03/31/2022) 90 tablet 1 Not Taking   Glucagon (GVOKE HYPOPEN 2-PACK) 1 MG/0.2ML SOAJ Inject 1 Act into the skin daily as needed. (Patient not taking: Reported on 03/31/2022) 2 mL 5 Not Taking   glucose blood (ACCU-CHEK GUIDE) test strip Use as instructed 100 each 12    insulin aspart (NOVOLOG FLEXPEN) 100 UNIT/ML FlexPen Inject 15 Units into the skin 3 (three) times daily with meals. (Patient not taking: Reported on 03/31/2022) 42 mL 1 Not Taking   Insulin Pen Needle 32G X 4 MM MISC 1 each by Does not apply route 4 (four) times daily -  with meals and at bedtime. 300 each 1    Iron, Ferrous Sulfate, 325 (65 Fe) MG TABS Take 325 mg by mouth 2 (two) times daily. (Patient not taking: Reported on 03/31/2022)  180 tablet 1 Not Taking   metFORMIN (GLUCOPHAGE XR) 750 MG 24 hr tablet Take 2 tablets (1,500 mg total) by mouth daily with breakfast. (Patient not taking: Reported on 03/31/2022) 180 tablet 0 Not Taking   pantoprazole (PROTONIX) 40 MG tablet Take 1 tablet (40 mg total) by mouth 2 (two) times daily before a meal. (Patient not taking: Reported on 02/05/2022) 60 tablet 1    tiZANidine (ZANAFLEX) 4 MG tablet Take 1 tablet (4 mg total) by mouth every 6 (six) hours as needed for muscle spasms. (Patient not taking: Reported on 03/31/2022) 30 tablet 0 Not Taking    Assessment: 57 yo M presented on 1/30 with severe rest pain in R foot and wound on R foot. Pt underwent R femoral endarterectomy, R anterior tibial thrombectomy and R common femoral artery to tibioperoneal trunk  bypass on 04/01/22. Pt was initiated on heparin 400 units/hr post-op. Pharmacy was consulted to transition patient from heparin infusion to rivaroxaban 2.5mg  BID for thrombotic risk reduction.   Hgb trending down gradually to 15.5, Plt 219 -stable No s/sx of bleeding post-op per RN  Goal of Therapy:  Monitor platelets by anticoagulation protocol: Yes   Plan:  Discontinue heparin infusion at the time of rivaroxaban administration of first dose Start rivaroxaban 2.5mg  PO BID  Monitor daily CBC and for s/sx of bleeding Pt's insurance requires a prior authorization to be completed for rivaroxaban, however patient is eligible for copay assistance.   Luisa Hart, PharmD, BCPS Clinical Pharmacist 04/02/2022 7:39 AM   Please refer to [facility] for pharmacy phone number

## 2022-04-02 NOTE — Progress Notes (Signed)
PHARMACIST LIPID MONITORING   Jose Morrow is a 57 y.o. male admitted on 04/01/2022 with critical R lower limb ischemia now s/p R femoral endarterectomy, R anterior tibial thrombectomy, and R common femoral artery to tibioperoneal trunk bypass on 04/01/22.  Pharmacy has been consulted to optimize lipid-lowering therapy with the indication of secondary prevention for clinical ASCVD.  Recent Labs:  Lipid Panel (last 6 months):   Lab Results  Component Value Date   CHOL 127 04/02/2022   TRIG 194 (H) 04/02/2022   HDL 49 04/02/2022   CHOLHDL 2.6 04/02/2022   VLDL 39 04/02/2022   LDLCALC 39 04/02/2022    Hepatic function panel (last 6 months):   Lab Results  Component Value Date   AST 26 04/01/2022   ALT 8 04/01/2022   ALKPHOS 58 04/01/2022   BILITOT 2.2 (H) 04/01/2022    SCr (since admission):   Serum creatinine: 1.59 mg/dL (H) 04/02/22 0612 Estimated creatinine clearance: 46.8 mL/min (A)  Current therapy and lipid therapy tolerance Current lipid-lowering therapy: atorvastatin 20mg  daily (last dose ~2 months prior to arrival due to inability to obtain refills) Previous lipid-lowering therapies (if applicable):  Atorvastatin 20mg  daily 11/12/20 - present Lovastatin 20mg  daily prior to 2017 - 11/12/20 Documented or reported allergies or intolerances to lipid-lowering therapies (if applicable): None  Assessment:   Lipid panel today with LDL 39, below goal. No changes to patient's lipid lowering therapy.  Plan:    1.Statin intensity (high intensity recommended for all patients regardless of the LDL):  No statin changes. Continue atorvastatin 20mg  daily with LDL <55.   2.Add ezetimibe (if any one of the following):   Not indicated at this time.  3.Refer to lipid clinic:   No  4.Follow-up with:  Primary care provider - Janith Lima, MD  5.Follow-up labs after discharge:  No changes in lipid therapy, repeat a lipid panel in one year.      Luisa Hart, PharmD, BCPS Clinical  Pharmacist 04/02/2022 10:02 AM   Please refer to [facility] for pharmacy phone number

## 2022-04-02 NOTE — Progress Notes (Signed)
Second attempt to call phlebotomy to draw pt's lab unsuccessfully.

## 2022-04-02 NOTE — Progress Notes (Signed)
Referral noted for benefits check regarding cost for  low dose Xarelto or Eliquis.   Both would Require Prior Authorization   The patient is insured through [employer]

## 2022-04-03 DIAGNOSIS — I70223 Atherosclerosis of native arteries of extremities with rest pain, bilateral legs: Secondary | ICD-10-CM | POA: Diagnosis not present

## 2022-04-03 LAB — CBC
HCT: 45.6 % (ref 39.0–52.0)
Hemoglobin: 14.6 g/dL (ref 13.0–17.0)
MCH: 26.9 pg (ref 26.0–34.0)
MCHC: 32 g/dL (ref 30.0–36.0)
MCV: 84 fL (ref 80.0–100.0)
Platelets: 204 10*3/uL (ref 150–400)
RBC: 5.43 MIL/uL (ref 4.22–5.81)
RDW: 16.3 % — ABNORMAL HIGH (ref 11.5–15.5)
WBC: 9.5 10*3/uL (ref 4.0–10.5)
nRBC: 0 % (ref 0.0–0.2)

## 2022-04-03 LAB — GLUCOSE, CAPILLARY
Glucose-Capillary: 123 mg/dL — ABNORMAL HIGH (ref 70–99)
Glucose-Capillary: 136 mg/dL — ABNORMAL HIGH (ref 70–99)

## 2022-04-03 LAB — BASIC METABOLIC PANEL
Anion gap: 7 (ref 5–15)
BUN: 18 mg/dL (ref 6–20)
CO2: 29 mmol/L (ref 22–32)
Calcium: 8.4 mg/dL — ABNORMAL LOW (ref 8.9–10.3)
Chloride: 98 mmol/L (ref 98–111)
Creatinine, Ser: 1.51 mg/dL — ABNORMAL HIGH (ref 0.61–1.24)
GFR, Estimated: 54 mL/min — ABNORMAL LOW (ref >60)
Glucose, Bld: 80 mg/dL (ref 70–99)
Potassium: 4.5 mmol/L (ref 3.5–5.1)
Sodium: 134 mmol/L — ABNORMAL LOW (ref 135–145)

## 2022-04-03 LAB — MAGNESIUM: Magnesium: 2 mg/dL (ref 1.7–2.4)

## 2022-04-03 MED ORDER — ASPIRIN 81 MG PO TBEC: 81.0000 mg | DELAYED_RELEASE_TABLET | Freq: Every day | ORAL | 12 refills | Status: AC

## 2022-04-03 MED ORDER — RIVAROXABAN 2.5 MG PO TABS: 2.5000 mg | ORAL_TABLET | Freq: Two times a day (BID) | ORAL | 3 refills | Status: AC

## 2022-04-03 MED ORDER — OXYCODONE HCL 5 MG PO TABS: 5.0000 mg | ORAL_TABLET | Freq: Four times a day (QID) | ORAL | 0 refills | Status: DC | PRN

## 2022-04-03 NOTE — Progress Notes (Signed)
   VASCULAR SURGERY ASSESSMENT & PLAN:   POD 2 FEM END/ FEM TPT BYPASS: His bypass graft is patent and his right foot is hyperemic.  He has excellent Doppler signals.   VASCULAR QUALITY INITIATIVE: He is on aspirin and a statin.   ANTICOAGULATION: He is on low-dose Xarelto given that he has a prosthetic bypass into the tibial peroneal trunk. (2.5 mg po BID).   HYPERTENSION: His blood pressure is under better control.  Appreciate Dr. Nena Alexander help.  He is also helping with the patient's multiple medical issues including diabetes, h/o CAD, H/O hyponatremia, and H/O polycythemia.   DISPOSITION: Home today if okay with Dr. Sloan Leiter.  The Prevena dressing will stay on for 7 days total.  We have discussed the importance of tobacco cessation.   SUBJECTIVE:   No complaints this morning  PHYSICAL EXAM:   Vitals:   04/02/22 1605 04/02/22 2139 04/03/22 0000 04/03/22 0749  BP: (!) 140/90 (!) 145/81 129/73 (!) 149/83  Pulse: 66 71 66 67  Resp: (!) 22 20 18 19   Temp: 98.1 F (36.7 C) 98.1 F (36.7 C)  98.8 F (37.1 C)  TempSrc: Oral Oral  Oral  SpO2: 98% 97% 96% 97%  Weight:      Height:       Right foot is hyperemic. He has a dopplerable anterior tibial, dorsalis pedis, peroneal, and posterior tibial signal with the Doppler. Wound is improving. The Prevena dressing has a good seal.  LABS:   Lab Results  Component Value Date   WBC 9.5 04/03/2022   HGB 14.6 04/03/2022   HCT 45.6 04/03/2022   MCV 84.0 04/03/2022   PLT 204 04/03/2022   Lab Results  Component Value Date   CREATININE 1.51 (H) 04/03/2022   CBG (last 3)  Recent Labs    04/02/22 1625 04/02/22 2105 04/03/22 0614  GLUCAP 257* 129* 123*    PROBLEM LIST:    Principal Problem:   Critical limb ischemia of both lower extremities (HCC) Active Problems:   Critical lower limb ischemia (HCC)   CURRENT MEDS:    amLODipine  10 mg Oral Daily   aspirin EC  81 mg Oral Daily   atorvastatin  20 mg Oral Daily    docusate sodium  100 mg Oral Daily   insulin aspart  0-15 Units Subcutaneous TID WC   insulin detemir  50 Units Subcutaneous Daily   lisinopril  5 mg Oral Daily   nicotine  21 mg Transdermal Daily   pantoprazole  40 mg Oral BID AC   rivaroxaban  2.5 mg Oral BID    Deitra Mayo Office: [phone] 04/03/2022

## 2022-04-03 NOTE — Progress Notes (Signed)
PROGRESS NOTE    Jose Morrow  GMW:[id number] DOB: Nov 09, 1965 DOA: 04/01/2022 PCP: Janith Lima, MD    Brief Narrative:  57 year old gentleman with history of peripheral vascular disease with chronic claudication, hypertension, type 2 diabetes on insulin, smoker who presented with severe resting right foot pain and found to have acute arterial occlusive.  He was emergently taken to surgery and underwent right femoral endarterectomy, right anterior tibial thrombectomy and right common femoral artery to tibial peroneal trunk bypass.  Medicine consulted for comanagement.   Assessment & Plan:   Critical limb ischemia: Status post emergent vascular surgery.  Stable as per surgery.  Postop management per surgery.  Heparin is stopped.  Started on low-dose Xarelto.  Remains on aspirin 81 mg daily.  Already on atorvastatin.  Essential hypertension: Blood pressures stable.  Continue amlodipine and losartan. Can resume hydrochlorothiazide today on discharge.  Acute kidney injury with hyponatremia: Underwent emergent surgery.  Also on diuretics.  Isotonic fluids overnight . Sodium corrected .  Creatinine is trending down.  May recheck in 1 to 2 weeks.  IDDM with long-term use of insulin: Patient only using Levemir at home.  Blood sugars are acceptable.  Smoker: Counseled to quit.  He is very motivated.  Polycythemia: Probably hemoconcentrated.  Improved.  Clinically improved.  Medically stable for discharge if deemed stable by surgery. Thank you for involving [place] in this patient's care.  Will sign off on discharge.   DVT prophylaxis: SCD's Start: 04/01/22 1601 rivaroxaban (XARELTO) tablet 2.5 mg   Code Status: Full code Family Communication: None at the bedside Disposition Plan: Status is: Inpatient Remains inpatient appropriate because: Possible discharge today.     Consultants:  TRH  Procedures:  Vascular surgery as above  Antimicrobials:   Perioperative   Subjective:  Seen and examined.  Denies any complaints.  Eager to go home.  Objective: Vitals:   04/02/22 1605 04/02/22 2139 04/03/22 0000 04/03/22 0749  BP: (!) 140/90 (!) 145/81 129/73 (!) 149/83  Pulse: 66 71 66 67  Resp: (!) 22 20 18 19   Temp: 98.1 F (36.7 C) 98.1 F (36.7 C)  98.8 F (37.1 C)  TempSrc: Oral Oral  Oral  SpO2: 98% 97% 96% 97%  Weight:      Height:        Intake/Output Summary (Last 24 hours) at 04/03/2022 1043 Last data filed at 04/03/2022 0754 Gross per 24 hour  Intake 1014.06 ml  Output 2140 ml  Net -1125.94 ml    Filed Weights   04/01/22 0823 04/01/22 1600  Weight: 76.2 kg 71.9 kg    Examination:  General exam: Appears calm and comfortable, sitting in chair. Respiratory system: No added sound Cardiovascular system: S1 & S2 heard, RRR. No pedal edema. Extremities: Symmetric 5 x 5 power. Skin:  He has wound VAC in place looks clean and dry.  Adequate perfusion and normal temperature of the extremities.    Data Reviewed: I have personally reviewed following labs and imaging studies  CBC: Recent Labs  Lab 03/28/22 1001 04/01/22 0931 04/01/22 1614 04/02/22 0612 04/03/22 0120  WBC  --  5.4 8.2 8.2 9.5  HGB 20.7* 20.1* 17.7* 15.5 14.6  HCT 61.0* 62.1* 52.2* 47.0 45.6  MCV  --  82.6 80.6 82.3 84.0  PLT  --  208 201 219 440    Basic Metabolic Panel: Recent Labs  Lab 03/28/22 1001 04/01/22 0931 04/02/22 0612 04/03/22 0120  NA 138 134* 130* 134*  K 3.3* 3.8 3.7 4.5  CL 98 98 96* 98  CO2  --  24 25 29   GLUCOSE 169* 179* 191* 80  BUN 15 17 21* 18  CREATININE 1.20 1.24 1.59* 1.51*  CALCIUM  --  9.5 8.3* 8.4*  MG  --   --   --  2.0    GFR: Estimated Creatinine Clearance: 49.3 mL/min (A) (by C-G formula based on SCr of 1.51 mg/dL (H)). Liver Function Tests: Recent Labs  Lab 04/01/22 0931  AST 26  ALT 8  ALKPHOS 58  BILITOT 2.2*  PROT 7.0  ALBUMIN 3.5    No results for input(s): "LIPASE", "AMYLASE" in  the last 168 hours. No results for input(s): "AMMONIA" in the last 168 hours. Coagulation Profile: Recent Labs  Lab 04/01/22 1030  INR 1.1    Cardiac Enzymes: No results for input(s): "CKTOTAL", "CKMB", "CKMBINDEX", "TROPONINI" in the last 168 hours. BNP (last 3 results) Recent Labs    02/05/22 1210  PROBNP 717.0*    HbA1C: No results for input(s): "HGBA1C" in the last 72 hours. CBG: Recent Labs  Lab 04/02/22 0616 04/02/22 1115 04/02/22 1625 04/02/22 2105 04/03/22 0614  GLUCAP 212* 231* 257* 129* 123*    Lipid Profile: Recent Labs    04/02/22 0612  CHOL 127  HDL 49  LDLCALC 39  TRIG 194*  CHOLHDL 2.6    Thyroid Function Tests: No results for input(s): "TSH", "T4TOTAL", "FREET4", "T3FREE", "THYROIDAB" in the last 72 hours. Anemia Panel: Recent Labs    04/01/22 1614  FERRITIN 20*  TIBC 368  IRON 271*  RETICCTPCT 1.6    Sepsis Labs: No results for input(s): "PROCALCITON", "LATICACIDVEN" in the last 168 hours.  Recent Results (from the past 240 hour(s))  Surgical pcr screen     Status: None   Collection Time: 04/01/22  8:35 AM   Specimen: Nasal Mucosa; Nasal Swab  Result Value Ref Range Status   MRSA, PCR NEGATIVE NEGATIVE Final   Staphylococcus aureus NEGATIVE NEGATIVE Final    Comment: (NOTE) The Xpert SA Assay (FDA approved for NASAL specimens in patients 68 years of age and older), is one component of a comprehensive surveillance program. It is not intended to diagnose infection nor to guide or monitor treatment. Performed at [facility], [place] [address] [place], [place] [address]          Radiology Studies: No results found.      Scheduled Meds:  amLODipine  10 mg Oral Daily   aspirin EC  81 mg Oral Daily   atorvastatin  20 mg Oral Daily   docusate sodium  100 mg Oral Daily   insulin aspart  0-15 Units Subcutaneous TID WC   insulin detemir  50 Units Subcutaneous Daily   lisinopril  5 mg Oral Daily   nicotine  21 mg  Transdermal Daily   pantoprazole  40 mg Oral BID AC   rivaroxaban  2.5 mg Oral BID   Continuous Infusions:  sodium chloride     sodium chloride 75 mL/hr at 04/02/22 2150   sodium chloride     magnesium sulfate bolus IVPB       LOS: 2 days    Time spent: 25 minutes    Barb Merino, MD [facility] Pager [phone]

## 2022-04-03 NOTE — TOC Transition Note (Addendum)
Transition of Care [facility]) - CM/SW Discharge Note   Patient Details  Name: Jose Morrow MRN: [id number] Date of Birth: 1965/09/03  Transition of Care [facility]) CM/SW Contact:  Verdell Carmine, RN Phone Number: 04/03/2022, 1:33 PM   Clinical Narrative:     Patient discharging moving well in room, dressing self etc. Delaney Meigs card and to present it to the pharmacy for one time 30 day free prescription.  Office set up home health with [facility]. [facility] ( Amy) notified of DC  Final next level of care: Home/Self Care with Home health Barriers to Discharge: No Barriers Identified   Patient Goals and CMS Choice      Discharge Placement    Home Self care                     Discharge Plan and Services Additional resources added to the After Visit Summary for                                       Social Determinants of Health (SDOH) Interventions SDOH Screenings   Food Insecurity: Food Insecurity Present (04/01/2022)  Housing: Medium Risk (04/01/2022)  Transportation Needs: No Transportation Needs (04/01/2022)  Utilities: Not At Risk (04/01/2022)  Depression (PHQ2-9): Low Risk  (02/05/2022)  Tobacco Use: High Risk (04/02/2022)     Readmission Risk Interventions     No data to display

## 2022-04-03 NOTE — Progress Notes (Signed)
Discharge instructions reviewed with pt.  Copy of instructions given to pt. Pt informed scripts sent to his pharmacy for pick up. Pt had concerns about how much his medications may cost him, specifically the xarelto. Spoke with unit case manager and she will go see him and provide him with a low copay card. Pt's sister coming to pick him up.  Pt not able to walk independently, has a wound vac to groin, not eligible to go to discharge lounge

## 2022-04-05 NOTE — Discharge Summary (Signed)
Bypass Discharge Summary Patient ID: PHILIPP SCHNITKER [id number] 57 y.o. [birth year]  Admit date: 04/01/2022  Discharge date and time: 04/03/2022  1:36 PM   Admitting Physician: Angelia Mould, MD   Discharge Physician: Deitra Mayo, MD  Admission Diagnoses: Critical limb ischemia of both lower extremities ([place]) [I70.223] Critical lower limb ischemia [facility]) [I70.229]  Discharge Diagnoses: Critical limb ischemia of both lower extremities ([place]) [I70.223] Critical lower limb ischemia ([place]) [I70.229]  Admission Condition: stable  Discharged Condition: good  Indication for Admission: ony L Binner is a 57 y.o. male who presented with severe rest pain of his right foot and a wound on the dorsum of his foot.  He underwent an arteriogram and was not a candidate for endovascular approach.  He presents for a right femoropopliteal bypass.   Hospital Course: Mr. Maybank was admitted on 04/01/22 and underwent right femoral endarterectomy, right anterior tibial thrombectomy and right common femoral artery to tibioperoneal trunk bypass with 6 mm ringed PTFE by Dr. Scot Dock. He tolerated the surgery well and was taken to the recovery room in stable condition.  POD#1, right lower extremity bypass graft remained patent. Excellent doppler signals in right foot appreciated. Incisions well appearing, Prevena VAC in right groin with good seal. Continued on IV heparin overnight. Transitioned to Xarelto. Medical management per [facility] for blood pressure control, diabetes, hyponatremia and polycythemia. Encouraged mobilization. Cleared by PT/ OT.   POD#2, right lower extremity remained well perfused and warm. Brisk doppler signals. Stable foot wound. Prevena VAC with good seal. Right leg incisions clean, dry and intact. Tolerating Xarelto. Continued medical management per Hospitalist team for his chronic medical issues. Overall progressing well. He remained stable for discharge home. Encouraged  smoking cessation. He will discharge on Aspirin, Statin, Xarelto. The following were discontinued Dapagliflozin, Metformin, Novolog, pantoprazole, tizanidine. PDMP was reviewed and post op pain medication was sent to the patients pharmacy as well. Prevena VAC will stay on right groin for 7 days. Instructions for [facility] provided to patient. He will follow up in 2-3 weeks in our office for incision check.   Consults:  Hospitalists  Treatments: antibiotics: vancomycin, analgesia: Oxycodone, Aspirin anticoagulation: heparin and Rivaroxaban, therapies: PT, OT, and SW, and surgery: Right femoral endarterectomy; Right anterior tibial thrombectomy; Right common femoral artery to tibioperoneal trunk bypass with 6 mm ringed PTFE   Disposition: Discharge disposition: 01-Home or Self Care       - For [facility] Registry use ---  Post-op:  Wound infection: No  Graft infection: No  Transfusion: No  If yes, 0 units given New Arrhythmia: No Patency judged by: Valu.Nieves ] Dopper only, [ ]$  Palpable graft pulse, [ ]$  Palpable distal pulse, [ ]$  ABI inc. > 0.15, [ ]$  Duplex D/C Ambulatory Status: Ambulatory  Complications: MI: [ X] No, [ ]$  Troponin only, [ ]$  EKG or Clinical CHF: No Resp failure: [ X] none, [ ]$  Pneumonia, [ ]$  Ventilator Chg in renal function: [ ]$  none, [ [place]. Cr > 0.5, [ ]$  Temp. Dialysis, [ ]$  Permanent dialysis Stroke: [ X] None, [ ]$  Minor, [ ]$  Major Return to OR: No  Reason for return to OR: [ ]$  Bleeding, [ ]$  Infection, [ ]$  Thrombosis, [ ]$  Revision  Discharge medications: Statin use:  Yes ASA use:  Yes Plavix use:  No  for medical reason on Xarelto Beta blocker use: NO Coumadin use: No    Patient Instructions:  Allergies as of 04/03/2022       Reactions  Penicillins Anaphylaxis   Ibuprofen Other (See Comments)   Pt states caused gastric ulcer        Medication List     STOP taking these medications    dapagliflozin propanediol 10 MG Tabs tablet Commonly known as:  Farxiga   Gvoke HypoPen 2-Pack 1 MG/0.2ML Soaj Generic drug: Glucagon   metFORMIN 750 MG 24 hr tablet Commonly known as: Glucophage XR   NovoLOG FlexPen 100 UNIT/ML FlexPen Generic drug: insulin aspart   pantoprazole 40 MG tablet Commonly known as: PROTONIX   tiZANidine 4 MG tablet Commonly known as: Zanaflex       TAKE these medications    Accu-Chek Guide test strip Generic drug: glucose blood Use as instructed   Accu-Chek Guide w/Device Kit Use as directed   Accu-Chek Softclix Lancets lancets Use as directed.   amLODipine 10 MG tablet Commonly known as: NORVASC Take 1 tablet (10 mg total) by mouth daily.   aspirin EC 81 MG tablet Take 1 tablet (81 mg total) by mouth daily. Swallow whole.   atorvastatin 20 MG tablet Commonly known as: LIPITOR Take 1 tablet (20 mg total) by mouth daily.   FreeStyle Libre 2 Reader [place] 1 Act by Does not apply route daily.   FreeStyle Libre 2 Sensor Misc 1 Act by Does not apply route daily.   insulin detemir 100 UNIT/ML FlexPen Commonly known as: LEVEMIR Inject 50 Units into the skin daily.   Insulin Pen Needle 32G X 4 MM Misc 1 each by Does not apply route 4 (four) times daily -  with meals and at bedtime.   Iron (Ferrous Sulfate) 325 (65 Fe) MG Tabs Take 325 mg by mouth 2 (two) times daily.   lisinopril 5 MG tablet Commonly known as: ZESTRIL Take 1 tablet (5 mg total) by mouth daily.   oxyCODONE 5 MG immediate release tablet Commonly known as: Oxy IR/ROXICODONE Take 1-2 tablets (5-10 mg total) by mouth every 6 (six) hours as needed for severe pain.   rivaroxaban 2.5 MG Tabs tablet Commonly known as: XARELTO Take 1 tablet (2.5 mg total) by mouth 2 (two) times daily.   triamterene-hydrochlorothiazide 37.5-25 MG capsule Commonly known as: Dyazide Take 1 each (1 capsule total) by mouth daily.               Discharge Care Instructions  (From admission, onward)           Start     Ordered   04/03/22  0000  Discharge wound care:       Comments: Wash incisions with mild soap and water, pat dry. Do not soak in bathtub  Keep Pravena wound vac on your groin incision until it loses it seal in about 7-10 days.  Once that happens, you can remove and then wash the groin wound with soap and water daily and pat dry. (No tub bath-only shower)  Then put a dry gauze or washcloth in the groin to keep this area dry to help prevent wound infection.  Do this daily and as needed.  Do not use Vaseline or neosporin on your incisions.  Only use soap and water on your incisions and then protect and keep dry.   04/03/22 1124           Activity: activity as tolerated, no driving while on analgesics, and no heavy lifting for 6 weeks Diet: diabetic diet and low fat, low cholesterol diet Wound Care:  Keep incisions clean and dry. Wash with mild soap and water,  pat dry. Do not soak in bathtub. The Prevena VAC on your groin incision will come off in 7-10 days. You can remove this like a large Band-Aid. Once VAC is off you can also wash this incision with mild soap and water, pat dry. You can place dry gauze in groin area to help wick moisture as needed.   Follow-up with [facility] in  2-3   weeks.  Signed: Karoline Caldwell 04/05/2022 8:43 AM

## 2022-04-07 ENCOUNTER — Encounter (HOSPITAL_COMMUNITY): Payer: Self-pay

## 2022-04-07 ENCOUNTER — Telehealth: Payer: Self-pay

## 2022-04-07 ENCOUNTER — Other Ambulatory Visit: Payer: Self-pay

## 2022-04-07 ENCOUNTER — Emergency Department (HOSPITAL_COMMUNITY)
Admission: EM | Admit: 2022-04-07 | Discharge: 2022-04-07 | Disposition: A | Discharge status: Home / Self Care | Payer: Commercial Managed Care - HMO | Attending: Emergency Medicine | Admitting: Emergency Medicine

## 2022-04-07 DIAGNOSIS — R309 Painful micturition, unspecified: Secondary | ICD-10-CM | POA: Diagnosis present

## 2022-04-07 DIAGNOSIS — Z794 Long term (current) use of insulin: Secondary | ICD-10-CM | POA: Insufficient documentation

## 2022-04-07 DIAGNOSIS — Z7901 Long term (current) use of anticoagulants: Secondary | ICD-10-CM | POA: Insufficient documentation

## 2022-04-07 DIAGNOSIS — Z7982 Long term (current) use of aspirin: Secondary | ICD-10-CM | POA: Diagnosis not present

## 2022-04-07 DIAGNOSIS — I10 Essential (primary) hypertension: Secondary | ICD-10-CM | POA: Insufficient documentation

## 2022-04-07 DIAGNOSIS — N3001 Acute cystitis with hematuria: Secondary | ICD-10-CM | POA: Insufficient documentation

## 2022-04-07 LAB — URINALYSIS, ROUTINE W REFLEX MICROSCOPIC
Glucose, UA: 50 mg/dL — AB
Hgb urine dipstick: NEGATIVE
Ketones, ur: NEGATIVE mg/dL
Leukocytes,Ua: NEGATIVE
Nitrite: NEGATIVE
Protein, ur: 100 mg/dL — AB
Specific Gravity, Urine: 1.026 (ref 1.005–1.030)
pH: 5 (ref 5.0–8.0)

## 2022-04-07 LAB — CBC WITH DIFFERENTIAL/PLATELET
Abs Immature Granulocytes: 0.04 10*3/uL (ref 0.00–0.07)
Basophils Absolute: 0 10*3/uL (ref 0.0–0.1)
Basophils Relative: 0 %
Eosinophils Absolute: 0.1 10*3/uL (ref 0.0–0.5)
Eosinophils Relative: 1 %
HCT: 41.7 % (ref 39.0–52.0)
Hemoglobin: 14.3 g/dL (ref 13.0–17.0)
Immature Granulocytes: 0 %
Lymphocytes Relative: 6 %
Lymphs Abs: 0.7 10*3/uL (ref 0.7–4.0)
MCH: 28.1 pg (ref 26.0–34.0)
MCHC: 34.3 g/dL (ref 30.0–36.0)
MCV: 82.1 fL (ref 80.0–100.0)
Monocytes Absolute: 1.2 10*3/uL — ABNORMAL HIGH (ref 0.1–1.0)
Monocytes Relative: 10 %
Neutro Abs: 9.7 10*3/uL — ABNORMAL HIGH (ref 1.7–7.7)
Neutrophils Relative %: 83 %
Platelets: 200 10*3/uL (ref 150–400)
RBC: 5.08 MIL/uL (ref 4.22–5.81)
RDW: 15.7 % — ABNORMAL HIGH (ref 11.5–15.5)
WBC: 11.8 10*3/uL — ABNORMAL HIGH (ref 4.0–10.5)
nRBC: 0 % (ref 0.0–0.2)

## 2022-04-07 LAB — BASIC METABOLIC PANEL
Anion gap: 15 (ref 5–15)
BUN: 16 mg/dL (ref 6–20)
CO2: 24 mmol/L (ref 22–32)
Calcium: 8.4 mg/dL — ABNORMAL LOW (ref 8.9–10.3)
Chloride: 92 mmol/L — ABNORMAL LOW (ref 98–111)
Creatinine, Ser: 1.31 mg/dL — ABNORMAL HIGH (ref 0.61–1.24)
GFR, Estimated: >60 mL/min (ref >60)
Glucose, Bld: 126 mg/dL — ABNORMAL HIGH (ref 70–99)
Potassium: 3.9 mmol/L (ref 3.5–5.1)
Sodium: 131 mmol/L — ABNORMAL LOW (ref 135–145)

## 2022-04-07 MED ORDER — PHENAZOPYRIDINE HCL 200 MG PO TABS: 200.0000 mg | ORAL_TABLET | Freq: Three times a day (TID) | ORAL | 0 refills | Status: AC

## 2022-04-07 MED ORDER — SULFAMETHOXAZOLE-TRIMETHOPRIM 800-160 MG PO TABS
1.0000 | ORAL_TABLET | Freq: Once | ORAL | Status: AC
  Administered 2022-04-07: 1 via ORAL
  Filled 2022-04-07: qty 1

## 2022-04-07 MED ORDER — SULFAMETHOXAZOLE-TRIMETHOPRIM 800-160 MG PO TABS: 1.0000 | ORAL_TABLET | Freq: Two times a day (BID) | ORAL | 0 refills | Status: AC

## 2022-04-07 NOTE — Discharge Instructions (Addendum)
Take the antibiotics prescribed for bladder infection.  Return to the ER if your symptoms get worse.  The wound VAC needs to be removed in 7 days after it was placed.  It just needs to be taken off like a Band-Aid.  Please return to the ER if your symptoms worsen; you have increased pain, fevers, chills, inability to keep any medications down, confusion. Otherwise see the outpatient doctor as requested.

## 2022-04-07 NOTE — ED Notes (Signed)
Pt has a wound vac to right groin post stenting procedure. His sister is at bedside.  They are not sure how it works or when or how to remove it.  He states he has 8/10 right leg pain.  He is also concerned about blood in urine.

## 2022-04-07 NOTE — ED Provider Notes (Signed)
[facility] Provider Note   CSN: [id number] Arrival date & time: 04/07/22  1108     History  Chief Complaint  Patient presents with   Hematuria   Post-op Problem    Jose Morrow is a 57 y.o. male.  HPI    Patient comes in with chief complaint of hypertension, hyperlipidemia, PVD with chronic claudication status post recent right femoral endarterectomy comes in with chief complaint of burning with urination.   Patient started having burning with urination and blood in the urine starting yesterday.  He denies any fevers, nausea, vomiting.  He also has questions in regard to the wound VAC that he has in place in his groin.  Review of system is negative for abdominal pain, fevers, chills, vomiting.  Home Medications Prior to Admission medications   Medication Sig Start Date End Date Taking? Authorizing Provider  phenazopyridine (PYRIDIUM) 200 MG tablet Take 1 tablet (200 mg total) by mouth 3 (three) times daily. 04/07/22  Yes Varney Biles, MD  sulfamethoxazole-trimethoprim (BACTRIM DS) 800-160 MG tablet Take 1 tablet by mouth 2 (two) times daily for 7 days. 04/07/22 04/14/22 Yes Varney Biles, MD  Accu-Chek Softclix Lancets lancets Use as directed. 06/29/20   Antonieta Pert, MD  amLODipine (NORVASC) 10 MG tablet Take 1 tablet (10 mg total) by mouth daily. 02/06/22 02/06/23  Fransico Meadow, PA-C  aspirin EC 81 MG tablet Take 1 tablet (81 mg total) by mouth daily. Swallow whole. 04/03/22   Baglia, Corrina, PA-C  atorvastatin (LIPITOR) 20 MG tablet Take 1 tablet (20 mg total) by mouth daily. Patient not taking: Reported on 03/31/2022 11/10/21   Lennice Sites, DO  Blood Glucose Monitoring Suppl (ACCU-CHEK GUIDE) w/Device KIT Use as directed 11/10/21   Lennice Sites, DO  Continuous Blood Gluc Receiver (FREESTYLE LIBRE 2 READER) DEVI 1 Act by Does not apply route daily. 02/06/22   Janith Lima, MD  Continuous Blood Gluc Sensor (FREESTYLE LIBRE 2 SENSOR)  MISC 1 Act by Does not apply route daily. 02/06/22   Janith Lima, MD  glucose blood (ACCU-CHEK GUIDE) test strip Use as instructed 11/10/21   Curatolo, Adam, DO  insulin detemir (LEVEMIR) 100 UNIT/ML FlexPen Inject 50 Units into the skin daily. 02/06/22 05/08/22  Janith Lima, MD  Insulin Pen Needle 32G X 4 MM MISC 1 each by Does not apply route 4 (four) times daily -  with meals and at bedtime. 11/10/21   Curatolo, Adam, DO  Iron, Ferrous Sulfate, 325 (65 Fe) MG TABS Take 325 mg by mouth 2 (two) times daily. Patient not taking: Reported on 03/31/2022 11/12/20   Janith Lima, MD  lisinopril (ZESTRIL) 5 MG tablet Take 1 tablet (5 mg total) by mouth daily. 02/06/22 02/06/23  Fransico Meadow, PA-C  oxyCODONE (OXY IR/ROXICODONE) 5 MG immediate release tablet Take 1-2 tablets (5-10 mg total) by mouth every 6 (six) hours as needed for severe pain. 04/03/22   Baglia, Corrina, PA-C  rivaroxaban (XARELTO) 2.5 MG TABS tablet Take 1 tablet (2.5 mg total) by mouth 2 (two) times daily. 04/03/22   Baglia, Corrina, PA-C  triamterene-hydrochlorothiazide (DYAZIDE) 37.5-25 MG capsule Take 1 each (1 capsule total) by mouth daily. 02/06/22   Fransico Meadow, PA-C  hydrochlorothiazide (HYDRODIURIL) 25 MG tablet Take 1 tablet (25 mg total) by mouth daily. 11/23/19 05/01/20  Garald Balding, PA-C      Allergies    Penicillins and Ibuprofen    Review of  Systems   Review of Systems  Physical Exam Updated Vital Signs BP (!) 154/88 (BP Location: Right Arm)   Pulse 76   Temp 98.6 F (37 C) (Oral)   Resp 17   Ht 5\' 9"  (1.753 m)   Wt 76.2 kg   SpO2 96%   BMI 24.81 kg/m  Physical Exam Vitals and nursing note reviewed.  Constitutional:      Appearance: He is well-developed.  HENT:     Head: Atraumatic.  Cardiovascular:     Rate and Rhythm: Normal rate.  Pulmonary:     Effort: Pulmonary effort is normal.  Abdominal:     Tenderness: There is abdominal tenderness.     Comments: Suprapubic tenderness without rebound  or guarding  Musculoskeletal:     Cervical back: Neck supple.  Skin:    General: Skin is warm.  Neurological:     Mental Status: He is alert and oriented to person, place, and time.     ED Results / Procedures / Treatments   Labs (all labs ordered are listed, but only abnormal results are displayed) Labs Reviewed  URINALYSIS, ROUTINE W REFLEX MICROSCOPIC - Abnormal; Notable for the following components:      Result Value   Color, Urine AMBER (*)    APPearance CLOUDY (*)    Glucose, UA 50 (*)    Bilirubin Urine MODERATE (*)    Protein, ur 100 (*)    Bacteria, UA MANY (*)    All other components within normal limits  BASIC METABOLIC PANEL - Abnormal; Notable for the following components:   Sodium 131 (*)    Chloride 92 (*)    Glucose, Bld 126 (*)    Creatinine, Ser 1.31 (*)    Calcium 8.4 (*)    All other components within normal limits  CBC WITH DIFFERENTIAL/PLATELET - Abnormal; Notable for the following components:   WBC 11.8 (*)    RDW 15.7 (*)    Neutro Abs 9.7 (*)    Monocytes Absolute 1.2 (*)    All other components within normal limits    EKG None  Radiology No results found.  Procedures Procedures    Medications Ordered in ED Medications  sulfamethoxazole-trimethoprim (BACTRIM DS) 800-160 MG per tablet 1 tablet (has no administration in time range)    ED Course/ Medical Decision Making/ A&P                             Medical Decision Making Amount and/or Complexity of Data Reviewed Labs: ordered.  Risk Prescription drug management.   57 year old patient with history of peripheral vascular disease status post right femoral endarterectomy comes in with chief complaint of burning with urination.  He also has questions about his wound VAC.  Patient denies abdominal pain, but on exam he does have lower quadrant abdominal tenderness.  No peritonitis.  No SIRS criteria on arrival.  No fevers, nausea, vomiting.  Differential diagnosis considered  includes kidney stones, cystitis, UTI.  The wound VAC looks okay.  I consulted vascular surgery, they indicated that the type of wound VAC patient is on just needs to be removed in 7 days like a Band-Aid.  There is nothing else to do.  This information has been relayed to the patient was comfortable with it.   Basic labs ordered, urinalysis ordered.  4:04 PM UA has many bacteria, pyuria.  Clinically he has UTI given the burning with urination with suprapubic tenderness on  exam.  Will discharge with Bactrim, return precautions discussed.  Patient unfortunately allergic to penicillin, with allergy listed as anaphylaxis.  He is unsure if he has taken cephalosporins in the past.  The patient appears reasonably screened and/or stabilized for discharge and I doubt any other medical condition or other [facility] requiring further screening, evaluation, or treatment in the ED at this time prior to discharge.   Results from the ER workup discussed with the patient face to face and all questions answered to the best of my ability. The patient is safe for discharge with strict return precautions.   Final Clinical Impression(s) / ED Diagnoses Final diagnoses:  Acute cystitis with hematuria    Rx / DC Orders ED Discharge Orders          Ordered    sulfamethoxazole-trimethoprim (BACTRIM DS) 800-160 MG tablet  2 times daily        04/07/22 1539    phenazopyridine (PYRIDIUM) 200 MG tablet  3 times daily        04/07/22 1539              Varney Biles, MD 04/07/22 1604

## 2022-04-07 NOTE — Consult Note (Signed)
[place] Nurse Consult Note: Reason for Consult: management of wound VAC Patient DC from [facility] 04/03/22. Came in today with questions about caring for the dressing. S/P vascular intervention 1/30 for critical limb ischemia.  Wound type: surgical  Pressure Injury POA: NA Discussed with ED MD, explained that these dressings are managed by surgical team but offered that they are DC on POD #7 and the Prevena machine will stop working around that time.  I reviewed DC summary from [facility] 2/1 and the device and dressings are to be removed POD 7. Recommended dry dressing after this Dressing procedure/placement/frequency:orders update to have patient DC prevena dressing and throw machine and dressing away.  Dry dressing only. Follow up with [facility] as scheduled. Appears the DC should occur on 2/7-2/8.   [place], [place], [place]

## 2022-04-07 NOTE — Telephone Encounter (Signed)
Transition Care Management Unsuccessful Follow-up Telephone Call  Date of discharge and from where:  [place] 04-03-22 Dx: critical limb ischemia of both lower extremities  Attempts:  1st Attempt  Reason for unsuccessful TCM follow-up call:  Left voice message   Juanda Crumble LPN [place] Direct Dial [phone]

## 2022-04-07 NOTE — ED Triage Notes (Signed)
Pt presents with hematuria and dysuria that started last PM. Denies fever. Pt states he had a femoral stent placed on 2/2. Pt also has questions and concerns over post-op care.

## 2022-04-08 NOTE — Telephone Encounter (Signed)
Transition Care Management Unsuccessful Follow-up Telephone Call  Date of discharge and from where:    [place] 04-03-22 Dx: critical limb ischemia of both lower extremities    Attempts:  2nd Attempt  Reason for unsuccessful TCM follow-up call:  Left voice message  Pt has fu with vein and vascular 04-24-22 at [place] [phone]

## 2022-04-14 ENCOUNTER — Telehealth: Payer: Self-pay

## 2022-04-14 ENCOUNTER — Telehealth: Payer: Self-pay | Admitting: Internal Medicine

## 2022-04-14 NOTE — Telephone Encounter (Signed)
Caller & Relationship to patient:  self    Call back number:[phone]   Date of last office visit:   03/2022   Date of next office visit:   Medication(s) to be refilled:  oxycodone        Preferred Pharmacy:  [facility]

## 2022-04-14 NOTE — Telephone Encounter (Signed)
Pt's sister, Anne Ng, called requesting a refill on pt's pain med to be sent to [facility] on [address].  Reviewed pt's chart, returned call for clarification, two identifiers used. Pt states that he's having severe pain at his groin where there was a wound vac and sharp shooting pains going down his leg. Pt is not taking any OTC meds. Instructed pt to take some OTC Tylenol and see how the rest of today and tonight went for him and call back in the morning. Pt's sister took the phone and stated that he really needed some additional pain meds and he only takes it in the morning, at night, and during the day when needed. Informed pt that he would need to be seen if he was still having significant pain. His symptoms shouldn't be masked by giving more pain meds if there was something more serious happening. Confirmed understanding. Appt scheduled.

## 2022-04-15 ENCOUNTER — Ambulatory Visit (INDEPENDENT_AMBULATORY_CARE_PROVIDER_SITE_OTHER): Payer: Commercial Managed Care - HMO | Admitting: Physician Assistant

## 2022-04-15 ENCOUNTER — Encounter: Payer: Self-pay | Admitting: Physician Assistant

## 2022-04-15 VITALS — BP 134/85 | HR 66 | Temp 97.7°F | Resp 18 | Ht 69.0 in | Wt 168.0 lb

## 2022-04-15 DIAGNOSIS — I70221 Atherosclerosis of native arteries of extremities with rest pain, right leg: Secondary | ICD-10-CM

## 2022-04-15 MED ORDER — OXYCODONE HCL 5 MG PO TABS: 5.0000 mg | ORAL_TABLET | Freq: Four times a day (QID) | ORAL | 0 refills | Status: AC | PRN

## 2022-04-15 NOTE — Progress Notes (Signed)
POST OPERATIVE OFFICE NOTE    CC:  F/u for surgery  HPI:  This is a 57 y.o. male who is s/p  on right femoral endarterectomy, right AT thrombectomy and right CFA to TPT bypass with 30m ringed PTFE by Dr. DScot Dock   Pt was discharged on low dose Xarelto given he had a prosthetic bypass graft into the TPT.  He was discharged home on POD 2.     Pt returns today for follow up and here with his sister.  Pt states he is having pain around the groin incision and the lower leg incision.  He states the wound on his foot is improving.  He denies any rest pain.  He is doing quite a bit of walking.  He continues to smoke.     Allergies  Allergen Reactions   Penicillins Anaphylaxis   Ibuprofen Other (See Comments)    Pt states caused gastric ulcer    Current Outpatient Medications  Medication Sig Dispense Refill   Accu-Chek Softclix Lancets lancets Use as directed. 100 each 5   amLODipine (NORVASC) 10 MG tablet Take 1 tablet (10 mg total) by mouth daily. 30 tablet 2   aspirin EC 81 MG tablet Take 1 tablet (81 mg total) by mouth daily. Swallow whole. 30 tablet 12   atorvastatin (LIPITOR) 20 MG tablet Take 1 tablet (20 mg total) by mouth daily. (Patient not taking: Reported on 03/31/2022) 90 tablet 3   Blood Glucose Monitoring Suppl (ACCU-CHEK GUIDE) w/Device KIT Use as directed 1 kit 0   Continuous Blood Gluc Receiver (FREESTYLE LIBRE 2 READER) DEVI 1 Act by Does not apply route daily. 2 each 5   Continuous Blood Gluc Sensor (FREESTYLE LIBRE 2 SENSOR) MISC 1 Act by Does not apply route daily. 2 each 5   glucose blood (ACCU-CHEK GUIDE) test strip Use as instructed 100 each 12   insulin detemir (LEVEMIR) 100 UNIT/ML FlexPen Inject 50 Units into the skin daily. 45 mL 0   Insulin Pen Needle 32G X 4 MM MISC 1 each by Does not apply route 4 (four) times daily -  with meals and at bedtime. 300 each 1   Iron, Ferrous Sulfate, 325 (65 Fe) MG TABS Take 325 mg by mouth 2 (two) times daily. (Patient not taking:  Reported on 03/31/2022) 180 tablet 1   lisinopril (ZESTRIL) 5 MG tablet Take 1 tablet (5 mg total) by mouth daily. 90 tablet 2   oxyCODONE (OXY IR/ROXICODONE) 5 MG immediate release tablet Take 1-2 tablets (5-10 mg total) by mouth every 6 (six) hours as needed for severe pain. 30 tablet 0   phenazopyridine (PYRIDIUM) 200 MG tablet Take 1 tablet (200 mg total) by mouth 3 (three) times daily. 6 tablet 0   rivaroxaban (XARELTO) 2.5 MG TABS tablet Take 1 tablet (2.5 mg total) by mouth 2 (two) times daily. 60 tablet 3   triamterene-hydrochlorothiazide (DYAZIDE) 37.5-25 MG capsule Take 1 each (1 capsule total) by mouth daily. 90 capsule 0   No current facility-administered medications for this visit.     ROS:  See HPI  Physical Exam:  Today's Vitals   04/15/22 1059  BP: 134/85  Pulse: 66  Resp: 18  Temp: 97.7 F (36.5 C)  TempSrc: Temporal  SpO2: 97%  Weight: 168 lb (76.2 kg)  Height: 5' 9"$  (1.753 m)  PainSc: 6    Body mass index is 24.81 kg/m.   Incision:  right groin and right below knee incisions are healing nicely.  They  are clean without evidence of infection.   Extremities:  easily palpable right DP pulse      Assessment/Plan:  This is a 57 y.o. male who is s/p: right femoral endarterectomy, right AT thrombectomy and right CFA to TPT bypass with 106m ringed PTFE by Dr. DScot Dock   -pt doing well with easily palpable right DP pulse.  The wound on his foot is healing.  He is not having any rest pain.   -his incisions look good and healing nicely.  I discussed with them to continue to keep the groin incision dry if not showering to help prevent infection.  They are doing a good job with wound cleansing.   -I did sent a prescription for Oxycodone [id number] prn pain #10 no refill.  Discussed with them that he should not require anymore pain medication and if he did, he would need to go to pain management.  He will also try to use Tylenol as well.  -he has appt scheduled for 2/22.   We will cancel that appt and have him f/u in 3 months with duplex of his right leg bypass and ABI.  Discussed with he and his sister to call sooner if there are any issues before then.   -he is still smoking - discussed with him the importance of smoking cessation and the increased risk of limb loss given his diabetes.  Discussed he has prosthetic bypass and he is certainly at risk of thrombosing this with continued smoking.  Discussed he is also at risk for heart attack and stroke as well as cancers.   -he will continue his low dose Xarelto for graft patency as well as his asa/statin.      SaLeontine LocketP[facility]elect [facility] [phone] Clinic MD:  ClCarlis Abbott

## 2022-04-16 ENCOUNTER — Other Ambulatory Visit: Payer: Self-pay

## 2022-04-16 DIAGNOSIS — I70229 Atherosclerosis of native arteries of extremities with rest pain, unspecified extremity: Secondary | ICD-10-CM

## 2022-04-16 DIAGNOSIS — I70219 Atherosclerosis of native arteries of extremities with intermittent claudication, unspecified extremity: Secondary | ICD-10-CM

## 2022-04-16 DIAGNOSIS — L97909 Non-pressure chronic ulcer of unspecified part of unspecified lower leg with unspecified severity: Secondary | ICD-10-CM

## 2022-04-16 DIAGNOSIS — I70221 Atherosclerosis of native arteries of extremities with rest pain, right leg: Secondary | ICD-10-CM

## 2022-07-15 ENCOUNTER — Other Ambulatory Visit (HOSPITAL_COMMUNITY): Payer: Commercial Managed Care - HMO

## 2022-07-15 ENCOUNTER — Encounter (HOSPITAL_COMMUNITY): Payer: Commercial Managed Care - HMO

## 2022-07-15 ENCOUNTER — Ambulatory Visit: Payer: Commercial Managed Care - HMO

## 2022-07-16 NOTE — Progress Notes (Deleted)
HISTORY AND PHYSICAL     CC:  follow up. Requesting Provider:  Etta Grandchild, MD  HPI: This is a 57 y.o. male who is here today for follow up for PAD.  Pt has hx of  right femoral endarterectomy, right AT thrombectomy and right CFA to TPT bypass with 6mm ringed PTFE by Dr. Edilia Bo on 04/01/2022.   Pt was discharged on low dose Xarelto given he had a prosthetic bypass graft into the TPT.  He was discharged home on POD 2.     Pt was last seen 04/15/2022 and at that time, he was having some pain around his groin incision and the lower leg.  He felt the wound on his foot was improving.  He was still smoking.   The pt returns today for follow up.  ***  The pt is on a statin for cholesterol management.    The pt is on an aspirin.    Other AC:  Xarelto The pt is on CCB, diuretic, ACEI for hypertension.  The pt is on medication for diabetes. Tobacco hx:  ***  Pt does *** have family hx of AAA.  Past Medical History:  Diagnosis Date   Diabetes mellitus    Hypertension    Peripheral vascular disease (HCC)     Past Surgical History:  Procedure Laterality Date   ABDOMINAL AORTOGRAM W/LOWER EXTREMITY N/A 03/28/2022   Procedure: ABDOMINAL AORTOGRAM W/LOWER EXTREMITY;  Surgeon: Chuck Hint, MD;  Location[facility] INVASIVE CV LAB;  Service: Cardiovascular;  Laterality: N/A;   APPLICATION OF WOUND VAC Right 04/01/2022   Procedure: APPLICATION OF PREVENA INCISIONAL WOUND VAC;  Surgeon: Chuck Hint, MD;  Location: [facility] OR;  Service: Vascular;  Laterality: Right;   BIOPSY  06/28/2020   Procedure: BIOPSY;  Surgeon: Hilarie Fredrickson, MD;  Location: [facility] ENDOSCOPY;  Service: Endoscopy;;   ENDARTERECTOMY FEMORAL Right 04/01/2022   Procedure: ENDARTERECTOMY FEMORAL;  Surgeon: Chuck Hint, MD;  Location: [facility] OR;  Service: Vascular;  Laterality: Right;   ESOPHAGOGASTRODUODENOSCOPY (EGD) WITH PROPOFOL N/A 06/28/2020   Procedure: ESOPHAGOGASTRODUODENOSCOPY (EGD) WITH PROPOFOL;  Surgeon:  Hilarie Fredrickson, MD;  Location: [facility] ENDOSCOPY;  Service: Endoscopy;  Laterality: N/A;   FEMORAL-POPLITEAL BYPASS GRAFT Right 04/01/2022   Procedure: RIGHT FEMORAL- TIBIAL PERONEAL TRUNK BYPASS WITH RINGED PTFE;  Surgeon: Chuck Hint, MD;  Location: [facility] OR;  Service: Vascular;  Laterality: Right;   THROMBECTOMY FEMORAL ARTERY Right 04/01/2022   Procedure: ANTERIOR TIBIAL THROMBECTOMY;  Surgeon: Chuck Hint, MD;  Location: [facility] OR;  Service: Vascular;  Laterality: Right;    Allergies  Allergen Reactions   Penicillins Anaphylaxis   Ibuprofen Other (See Comments)    Pt states caused gastric ulcer    Current Outpatient Medications  Medication Sig Dispense Refill   Accu-Chek Softclix Lancets lancets Use as directed. 100 each 5   amLODipine (NORVASC) 10 MG tablet Take 1 tablet (10 mg total) by mouth daily. 30 tablet 2   aspirin EC 81 MG tablet Take 1 tablet (81 mg total) by mouth daily. Swallow whole. 30 tablet 12   atorvastatin (LIPITOR) 20 MG tablet Take 1 tablet (20 mg total) by mouth daily. 90 tablet 3   Blood Glucose Monitoring Suppl (ACCU-CHEK GUIDE) w/Device KIT Use as directed 1 kit 0   Continuous Blood Gluc Receiver (FREESTYLE LIBRE 2 READER) DEVI 1 Act by Does not apply route daily. 2 each 5   Continuous Blood Gluc Sensor (FREESTYLE LIBRE 2 SENSOR) MISC 1 Act  by Does not apply route daily. 2 each 5   glucose blood (ACCU-CHEK GUIDE) test strip Use as instructed 100 each 12   insulin detemir (LEVEMIR) 100 UNIT/ML FlexPen Inject 50 Units into the skin daily. 45 mL 0   Insulin Pen Needle 32G X 4 MM MISC 1 each by Does not apply route 4 (four) times daily -  with meals and at bedtime. 300 each 1   Iron, Ferrous Sulfate, 325 (65 Fe) MG TABS Take 325 mg by mouth 2 (two) times daily. 180 tablet 1   lisinopril (ZESTRIL) 5 MG tablet Take 1 tablet (5 mg total) by mouth daily. 90 tablet 2   oxyCODONE (OXY IR/ROXICODONE) 5 MG immediate release tablet Take 1 tablet (5 mg total) by  mouth every 6 (six) hours as needed for moderate pain. 10 tablet 0   phenazopyridine (PYRIDIUM) 200 MG tablet Take 1 tablet (200 mg total) by mouth 3 (three) times daily. 6 tablet 0   rivaroxaban (XARELTO) 2.5 MG TABS tablet Take 1 tablet (2.5 mg total) by mouth 2 (two) times daily. 60 tablet 3   triamterene-hydrochlorothiazide (DYAZIDE) 37.5-25 MG capsule Take 1 each (1 capsule total) by mouth daily. 90 capsule 0   No current facility-administered medications for this visit.    No family history on file.  Social History   Socioeconomic History   Marital status: Divorced    Spouse name: Not on file   Number of children: Not on file   Years of education: Not on file   Highest education level: Not on file  Occupational History   Not on file  Tobacco Use   Smoking status: Every Day    Packs/day: 1.50    Years: 40.00    Additional pack years: 0.00    Total pack years: 60.00    Types: Cigarettes   Smokeless tobacco: Never  Vaping Use   Vaping Use: Never used  Substance and Sexual Activity   Alcohol use: Yes    Comment: occasionally   Drug use: No   Sexual activity: Not on file  Other Topics Concern   Not on file  Social History Narrative   Not on file   Social Determinants of Health   Financial Resource Strain: Not on file  Food Insecurity: Food Insecurity Present (04/01/2022)   Hunger Vital Sign    Worried About Running Out of Food in the Last Year: Often true    Ran Out of Food in the Last Year: Sometimes true  Transportation Needs: No Transportation Needs (04/01/2022)   PRAPARE - Administrator, Civil Service (Medical): No    Lack of Transportation (Non-Medical): No  Physical Activity: Not on file  Stress: Not on file  Social Connections: Not on file  Intimate Partner Violence: Not At Risk (04/01/2022)   Humiliation, Afraid, Rape, and Kick questionnaire    Fear of Current or Ex-Partner: No    Emotionally Abused: No    Physically Abused: No    Sexually  Abused: No     REVIEW OF SYSTEMS:  *** [X]  denotes positive finding, [ ]  denotes negative finding Cardiac  Comments:  Chest pain or chest pressure:    Shortness of breath upon exertion:    Short of breath when lying flat:    Irregular heart rhythm:        Vascular    Pain in calf, thigh, or hip brought on by ambulation:    Pain in feet at night that wakes you up  from your sleep:     Blood clot in your veins:    Leg swelling:         Pulmonary    Oxygen at home:    Productive cough:     Wheezing:         Neurologic    Sudden weakness in arms or legs:     Sudden numbness in arms or legs:     Sudden onset of difficulty speaking or slurred speech:    Temporary loss of vision in one eye:     Problems with dizziness:         Gastrointestinal    Blood in stool:     Vomited blood:         Genitourinary    Burning when urinating:     Blood in urine:        Psychiatric    Major depression:         Hematologic    Bleeding problems:    Problems with blood clotting too easily:        Skin    Rashes or ulcers:        Constitutional    Fever or chills:      PHYSICAL EXAMINATION:  ***  General:  WDWN in NAD; vital signs documented above Gait: Not observed HENT: WNL, normocephalic Pulmonary: normal non-labored breathing , without wheezing Cardiac: {Desc; regular/irreg:14544} HR, {With/Without:20273} carotid bruit*** Abdomen: soft, NT; aortic pulse is *** palpable Skin: {With/Without:20273} rashes Vascular Exam/Pulses:  Right Left  Radial {Exam; arterial pulse strength 0-4:30167} {Exam; arterial pulse strength 0-4:30167}  Femoral {Exam; arterial pulse strength 0-4:30167} {Exam; arterial pulse strength 0-4:30167}  Popliteal {Exam; arterial pulse strength 0-4:30167} {Exam; arterial pulse strength 0-4:30167}  DP {Exam; arterial pulse strength 0-4:30167} {Exam; arterial pulse strength 0-4:30167}  PT {Exam; arterial pulse strength 0-4:30167} {Exam; arterial pulse  strength 0-4:30167}  Peroneal *** ***   Extremities: {With/Without:20273} ischemic changes, {With/Without:20273} Gangrene , {With/Without:20273} cellulitis; {With/Without:20273} open wounds Musculoskeletal: no muscle wasting or atrophy  Neurologic: A&O X 3 Psychiatric:  The pt has {Desc; normal/abnormal:11317::"Normal"} affect.   Non-Invasive Vascular Imaging:   ABI's/TBI's on 07/17/2022: Right:  *** - Great toe pressure: *** Left:  *** - Great toe pressure: ***  Arterial duplex on 07/17/2022: ***  Previous ABI's/TBI's on 02/28/2022: Right:  0.33/0 - Great toe pressure: 0 Left:  1.07/0.67 - Great toe pressure:  160  Previous arterial duplex on ***: ***    ASSESSMENT/PLAN:: 57 y.o. male here for follow up for PAD with hx of  right femoral endarterectomy, right AT thrombectomy and right CFA to TPT bypass with 6mm ringed PTFE by Dr. Edilia Bo on 04/01/2022.   Pt was discharged on low dose Xarelto given he had a prosthetic bypass graft into the TPT.   -*** -continue asa/statin -pt will f/u in *** with ***.   Doreatha Massed, [facility] [phone]  Clinic MD:   Edilia Bo

## 2022-07-17 ENCOUNTER — Ambulatory Visit (HOSPITAL_COMMUNITY): Payer: 59 | Attending: Vascular Surgery

## 2022-07-17 ENCOUNTER — Ambulatory Visit (HOSPITAL_COMMUNITY): Payer: 59

## 2022-07-17 ENCOUNTER — Ambulatory Visit: Payer: Commercial Managed Care - HMO

## 2022-08-15 ENCOUNTER — Encounter: Payer: Self-pay | Admitting: Vascular Surgery

## 2022-09-11 ENCOUNTER — Encounter: Payer: Self-pay | Admitting: Physician Assistant

## 2022-09-11 ENCOUNTER — Ambulatory Visit: Payer: Medicaid Other

## 2022-09-11 ENCOUNTER — Ambulatory Visit (HOSPITAL_COMMUNITY)
Admission: RE | Admit: 2022-09-11 | Discharge: 2022-09-11 | Disposition: A | Discharge status: Home / Self Care | Payer: Medicaid Other | Source: Ambulatory Visit | Attending: Vascular Surgery | Admitting: Vascular Surgery

## 2022-09-11 ENCOUNTER — Ambulatory Visit (INDEPENDENT_AMBULATORY_CARE_PROVIDER_SITE_OTHER)
Admission: RE | Admit: 2022-09-11 | Discharge: 2022-09-11 | Disposition: A | Discharge status: Home / Self Care | Payer: Medicaid Other | Source: Ambulatory Visit | Attending: Vascular Surgery | Admitting: Vascular Surgery

## 2022-09-11 ENCOUNTER — Encounter: Payer: Self-pay | Admitting: *Deleted

## 2022-09-11 VITALS — BP 213/127 | HR 77 | Temp 98.4°F | Resp 20 | Ht 69.0 in | Wt 169.0 lb

## 2022-09-11 DIAGNOSIS — I70219 Atherosclerosis of native arteries of extremities with intermittent claudication, unspecified extremity: Secondary | ICD-10-CM

## 2022-09-11 DIAGNOSIS — I70229 Atherosclerosis of native arteries of extremities with rest pain, unspecified extremity: Secondary | ICD-10-CM

## 2022-09-11 DIAGNOSIS — L97909 Non-pressure chronic ulcer of unspecified part of unspecified lower leg with unspecified severity: Secondary | ICD-10-CM

## 2022-09-11 DIAGNOSIS — I70221 Atherosclerosis of native arteries of extremities with rest pain, right leg: Secondary | ICD-10-CM

## 2022-09-11 DIAGNOSIS — I70299 Other atherosclerosis of native arteries of extremities, unspecified extremity: Secondary | ICD-10-CM | POA: Diagnosis not present

## 2022-09-11 LAB — VAS US ABI WITH/WO TBI
Left ABI: 1.11
Right ABI: 1.16

## 2022-09-11 NOTE — Progress Notes (Signed)
[facility] [place] HISTORY AND PHYSICAL   History of Present Illness:  Patient is a 57 y.o. year old male who presents for evaluation of PAD with history of  right femoral endarterectomy, right AT thrombectomy and right CFA to TPT bypass with 6mm ringed PTFE by Dr. Edilia Bo 04/01/22.   Pt was discharged on low dose Xarelto given he had a prosthetic bypass graft into the TPT.  Prior to the surgery he had sever rest pain in the right foot with tissue loss.    He is medically managed on ASA and daily Statin.    Past Medical History:  Diagnosis Date   Diabetes mellitus    Hypertension    Peripheral vascular disease (HCC)     Past Surgical History:  Procedure Laterality Date   ABDOMINAL AORTOGRAM W/LOWER EXTREMITY N/A 03/28/2022   Procedure: ABDOMINAL AORTOGRAM W/LOWER EXTREMITY;  Surgeon: Chuck Hint, MD;  Location: [facility] INVASIVE CV LAB;  Service: Cardiovascular;  Laterality: N/A;   APPLICATION OF WOUND VAC Right 04/01/2022   Procedure: APPLICATION OF PREVENA INCISIONAL WOUND VAC;  Surgeon: Chuck Hint, MD;  Location: [facility] OR;  Service: Vascular;  Laterality: Right;   BIOPSY  06/28/2020   Procedure: BIOPSY;  Surgeon: Hilarie Fredrickson, MD;  Location: [facility] ENDOSCOPY;  Service: Endoscopy;;   ENDARTERECTOMY FEMORAL Right 04/01/2022   Procedure: ENDARTERECTOMY FEMORAL;  Surgeon: Chuck Hint, MD;  Location: [facility] OR;  Service: Vascular;  Laterality: Right;   ESOPHAGOGASTRODUODENOSCOPY (EGD) WITH PROPOFOL N/A 06/28/2020   Procedure: ESOPHAGOGASTRODUODENOSCOPY (EGD) WITH PROPOFOL;  Surgeon: Hilarie Fredrickson, MD;  Location: [facility] ENDOSCOPY;  Service: Endoscopy;  Laterality: N/A;   FEMORAL-POPLITEAL BYPASS GRAFT Right 04/01/2022   Procedure: RIGHT FEMORAL- TIBIAL PERONEAL TRUNK BYPASS WITH RINGED PTFE;  Surgeon: Chuck Hint, MD;  Location: [facility] OR;  Service: Vascular;  Laterality: Right;   THROMBECTOMY FEMORAL ARTERY Right 04/01/2022   Procedure: ANTERIOR  TIBIAL THROMBECTOMY;  Surgeon: Chuck Hint, MD;  Location: [facility] OR;  Service: Vascular;  Laterality: Right;    ROS:   General:  No weight loss, Fever, chills  HEENT: No recent headaches, no nasal bleeding, no visual changes, no sore throat  Neurologic: No dizziness, blackouts, seizures. No recent symptoms of stroke or mini- stroke. No recent episodes of slurred speech, or temporary blindness.  Cardiac: No recent episodes of chest pain/pressure, no shortness of breath at rest.  No shortness of breath with exertion.  Denies history of atrial fibrillation or irregular heartbeat  Vascular: No history of rest pain in feet.  No history of claudication.  No history of non-healing ulcer, No history of DVT   Pulmonary: No home oxygen, no productive cough, no hemoptysis,  No asthma or wheezing  Musculoskeletal:  [ ]  Arthritis, [ ]  Low back pain,  [ ]  Joint pain  Hematologic:No history of hypercoagulable state.  No history of easy bleeding.  No history of anemia  Gastrointestinal: No hematochezia or melena,  No gastroesophageal reflux, no trouble swallowing  Urinary: [ ]  chronic Kidney disease, [ ]  on HD - [ ]  MWF or [ ]  TTHS, [ ]  Burning with urination, [ ]  Frequent urination, [ ]  Difficulty urinating;   Skin: No rashes  Psychological: No history of anxiety,  No history of depression  Social History Social History   Tobacco Use   Smoking status: Every Day    Current packs/day: 0.50    Average packs/day: 0.5 packs/day for 40.0 years (20.0 ttl pk-yrs)  Types: Cigarettes   Smokeless tobacco: Never  Vaping Use   Vaping status: Never Used  Substance Use Topics   Alcohol use: Yes    Comment: occasionally   Drug use: No    Family History History reviewed. No pertinent family history.  Allergies  Allergies  Allergen Reactions   Penicillins Anaphylaxis   Ibuprofen Other (See Comments)    Pt states caused gastric ulcer     Current Outpatient Medications  Medication  Sig Dispense Refill   Accu-Chek Softclix Lancets lancets Use as directed. 100 each 5   amLODipine (NORVASC) 10 MG tablet Take 1 tablet (10 mg total) by mouth daily. 30 tablet 2   aspirin EC 81 MG tablet Take 1 tablet (81 mg total) by mouth daily. Swallow whole. 30 tablet 12   atorvastatin (LIPITOR) 20 MG tablet Take 1 tablet (20 mg total) by mouth daily. 90 tablet 3   Blood Glucose Monitoring Suppl (ACCU-CHEK GUIDE) w/Device KIT Use as directed 1 kit 0   Continuous Blood Gluc Receiver (FREESTYLE LIBRE 2 READER) DEVI 1 Act by Does not apply route daily. 2 each 5   Continuous Blood Gluc Sensor (FREESTYLE LIBRE 2 SENSOR) MISC 1 Act by Does not apply route daily. 2 each 5   glucose blood (ACCU-CHEK GUIDE) test strip Use as instructed 100 each 12   Insulin Pen Needle 32G X 4 MM MISC 1 each by Does not apply route 4 (four) times daily -  with meals and at bedtime. 300 each 1   Iron, Ferrous Sulfate, 325 (65 Fe) MG TABS Take 325 mg by mouth 2 (two) times daily. 180 tablet 1   lisinopril (ZESTRIL) 5 MG tablet Take 1 tablet (5 mg total) by mouth daily. 90 tablet 2   oxyCODONE (OXY IR/ROXICODONE) 5 MG immediate release tablet Take 1 tablet (5 mg total) by mouth every 6 (six) hours as needed for moderate pain. 10 tablet 0   phenazopyridine (PYRIDIUM) 200 MG tablet Take 1 tablet (200 mg total) by mouth 3 (three) times daily. 6 tablet 0   rivaroxaban (XARELTO) 2.5 MG TABS tablet Take 1 tablet (2.5 mg total) by mouth 2 (two) times daily. 60 tablet 3   triamterene-hydrochlorothiazide (DYAZIDE) 37.5-25 MG capsule Take 1 each (1 capsule total) by mouth daily. 90 capsule 0   insulin detemir (LEVEMIR) 100 UNIT/ML FlexPen Inject 50 Units into the skin daily. 45 mL 0   No current facility-administered medications for this visit.    Physical Examination  Vitals:   09/11/22 1252  BP: (!) 213/127  Pulse: 77  Resp: 20  Temp: 98.4 F (36.9 C)  SpO2: 97%  Weight: 169 lb (76.7 kg)  Height: 5\' 9"  (1.753 m)     Body mass index is 24.96 kg/m.  General:  Alert and oriented, no acute distress HEENT: Normal Neck: No bruit or JVD Pulmonary: Clear to auscultation bilaterally Cardiac: Regular Rate and Rhythm without murmur Abdomen: Soft, non-tender, non-distended, no mass, no scars Skin: No rash, incisions are healing well, no fullness in the groin Extremity Pulses:  palpable right DP Musculoskeletal: No deformity or edema  Neurologic: Upper and lower extremity motor 5/5 and symmetric  DATA:  ABI Findings:  +---------+------------------+-----+---------+--------+  Right   Rt Pressure (mmHg)IndexWaveform Comment   +---------+------------------+-----+---------+--------+  Brachial 205                                       +---------+------------------+-----+---------+--------+  PTA     209               1.01 biphasic           +---------+------------------+-----+---------+--------+  DP      239               1.16 triphasic          +---------+------------------+-----+---------+--------+  Great Toe177               0.86                    +---------+------------------+-----+---------+--------+   +---------+------------------+-----+---------+-------+  Left    Lt Pressure (mmHg)IndexWaveform Comment  +---------+------------------+-----+---------+-------+  Brachial 206                                      +---------+------------------+-----+---------+-------+  PTA     222               1.08 triphasic         +---------+------------------+-----+---------+-------+  DP      228               1.11 triphasic         +---------+------------------+-----+---------+-------+  Great Toe174               0.84                   +---------+------------------+-----+---------+-------+   +-------+-----------+-----------+------------+------------+  ABI/TBIToday's ABIToday's TBIPrevious ABIPrevious TBI   +-------+-----------+-----------+------------+------------+  Right 1.16       0.86       0.33        absent        +-------+-----------+-----------+------------+------------+  Left  1.11       0.84       1.07        0.67          +-------+-----------+-----------+------------+------------+         Right ABIs appear increased. Left ABIs appear essentially unchanged  compared to prior study on 02/28/2022.    Summary:  Right: Resting right ankle-brachial index is within normal range. The  right toe-brachial index is normal.   Left: Resting left ankle-brachial index is within normal range. The left  toe-brachial index is normal.    Right Graft #1: femoral to peroneal trunk  +------------------+--------+--------+---------+---------------+                   PSV cm/sStenosisWaveform Comments         +------------------+--------+--------+---------+---------------+  Inflow           114             biphasic                  +------------------+--------+--------+---------+---------------+  Prox Anastomosis  105             triphasic                 +------------------+--------+--------+---------+---------------+  Proximal Graft    114             triphasicperigraft fluid  +------------------+--------+--------+---------+---------------+  Mid Graft         95              triphasicperigraft fluid  +------------------+--------+--------+---------+---------------+  Distal Graft      131  triphasic                 +------------------+--------+--------+---------+---------------+  Distal Anastomosis118             triphasic                 +------------------+--------+--------+---------+---------------+  Outflow          52              biphasic                  +------------------+--------+--------+---------+---------------+        Summary:  Right: Widely patent bypass graft with perigraft fluid observed in the   proximal and mid thigh segments.   ASSESSMENT/ PLAN:   Patient is a 57 y.o. year old male who presents for evaluation of PAD with history of  right femoral endarterectomy, right AT thrombectomy and right CFA to TPT bypass with 6mm ringed PTFE by Dr. Edilia Bo 04/01/22.    He feels like most days his pain is better he continue to have longer distance claudication than prior to surgery.  He denies rest pain and sleeps well. He denies new wounds.  He wishes to return to work and states he feels up to it.  He has well healed incisions and palpable pedal pulses B LE.  Patent bypass graft and improved ABI's.  He will f/u in 6 months for repeat studies.  I encouraged him to stop smoking and start a walking program.      Mosetta Pigeon PA-C [facility] [place] Office: [phone]  MD on call [facility]ore

## 2022-09-13 ENCOUNTER — Other Ambulatory Visit: Payer: Self-pay

## 2022-09-13 DIAGNOSIS — I70221 Atherosclerosis of native arteries of extremities with rest pain, right leg: Secondary | ICD-10-CM

## 2022-09-13 DIAGNOSIS — I70219 Atherosclerosis of native arteries of extremities with intermittent claudication, unspecified extremity: Secondary | ICD-10-CM

## 2022-11-11 NOTE — Progress Notes (Signed)
Patient hand delivered clearance form from [facility] on 10/30/22 for medical clearance/medication hold for dental treatment to be signed by C. Edilia Bo, MD.  Provider signed on 11/05/22, form faxed back to sender on 11/06/22, verified successful, sent to scan center.

## 2023-03-19 ENCOUNTER — Ambulatory Visit: Payer: Medicaid Other

## 2023-03-19 ENCOUNTER — Ambulatory Visit (HOSPITAL_COMMUNITY): Payer: 59

## 2023-05-15 ENCOUNTER — Ambulatory Visit (HOSPITAL_COMMUNITY): Admission: RE | Admit: 2023-05-15 | Payer: MEDICAID | Source: Ambulatory Visit

## 2023-05-15 ENCOUNTER — Ambulatory Visit: Payer: MEDICAID

## 2023-07-20 ENCOUNTER — Ambulatory Visit (HOSPITAL_COMMUNITY): Payer: MEDICAID

## 2023-07-20 ENCOUNTER — Ambulatory Visit: Payer: MEDICAID | Attending: Surgery

## 2023-07-20 ENCOUNTER — Ambulatory Visit (HOSPITAL_COMMUNITY): Payer: MEDICAID | Attending: Surgery
